# Patient Record
Sex: Female | Born: 1945 | Race: White | Hispanic: No | Marital: Married | Smoking: Current every day smoker
Health system: Southern US, Community
[De-identification: ages and names within clinical notes are randomized; demographics above are authoritative.]

## PROBLEM LIST (undated history)

## (undated) DIAGNOSIS — Z716 Tobacco abuse counseling: Secondary | ICD-10-CM

## (undated) DIAGNOSIS — D649 Anemia, unspecified: Secondary | ICD-10-CM

## (undated) DIAGNOSIS — J439 Emphysema, unspecified: Secondary | ICD-10-CM

## (undated) DIAGNOSIS — F329 Major depressive disorder, single episode, unspecified: Secondary | ICD-10-CM

## (undated) DIAGNOSIS — G459 Transient cerebral ischemic attack, unspecified: Secondary | ICD-10-CM

## (undated) DIAGNOSIS — J189 Pneumonia, unspecified organism: Secondary | ICD-10-CM

## (undated) DIAGNOSIS — M546 Pain in thoracic spine: Secondary | ICD-10-CM

## (undated) DIAGNOSIS — M1990 Unspecified osteoarthritis, unspecified site: Secondary | ICD-10-CM

## (undated) DIAGNOSIS — E039 Hypothyroidism, unspecified: Secondary | ICD-10-CM

## (undated) DIAGNOSIS — C4491 Basal cell carcinoma of skin, unspecified: Secondary | ICD-10-CM

## (undated) DIAGNOSIS — K219 Gastro-esophageal reflux disease without esophagitis: Secondary | ICD-10-CM

## (undated) DIAGNOSIS — L719 Rosacea, unspecified: Secondary | ICD-10-CM

## (undated) DIAGNOSIS — F32A Depression, unspecified: Secondary | ICD-10-CM

## (undated) DIAGNOSIS — R918 Other nonspecific abnormal finding of lung field: Secondary | ICD-10-CM

## (undated) DIAGNOSIS — R519 Headache, unspecified: Secondary | ICD-10-CM

## (undated) DIAGNOSIS — R51 Headache: Secondary | ICD-10-CM

## (undated) DIAGNOSIS — Z5111 Encounter for antineoplastic chemotherapy: Secondary | ICD-10-CM

## (undated) DIAGNOSIS — F419 Anxiety disorder, unspecified: Secondary | ICD-10-CM

## (undated) DIAGNOSIS — R000 Tachycardia, unspecified: Secondary | ICD-10-CM

## (undated) DIAGNOSIS — I639 Cerebral infarction, unspecified: Secondary | ICD-10-CM

## (undated) HISTORY — DX: Rosacea, unspecified: L71.9

## (undated) HISTORY — DX: Tobacco abuse counseling: Z71.6

## (undated) HISTORY — DX: Encounter for antineoplastic chemotherapy: Z51.11

## (undated) HISTORY — PX: BASAL CELL CARCINOMA EXCISION: SHX1214

## (undated) HISTORY — DX: Gastro-esophageal reflux disease without esophagitis: K21.9

## (undated) HISTORY — PX: TUBAL LIGATION: SHX77

## (undated) HISTORY — PX: BACK SURGERY: SHX140

## (undated) HISTORY — DX: Pain in thoracic spine: M54.6

## (undated) HISTORY — DX: Unspecified osteoarthritis, unspecified site: M19.90

## (undated) HISTORY — PX: BREAST BIOPSY: SHX20

## (undated) HISTORY — DX: Hypothyroidism, unspecified: E03.9

---
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
## 2001-08-24 HISTORY — PX: BUNIONECTOMY: SHX129

## 2004-08-29 ENCOUNTER — Other Ambulatory Visit: Admission: RE | Admit: 2004-08-29 | Discharge: 2004-08-29 | Payer: Self-pay | Admitting: Internal Medicine

## 2004-08-29 ENCOUNTER — Ambulatory Visit: Payer: Self-pay | Admitting: Internal Medicine

## 2004-11-18 ENCOUNTER — Emergency Department (HOSPITAL_COMMUNITY): Admission: EM | Admit: 2004-11-18 | Discharge: 2004-11-18 | Payer: Self-pay | Admitting: Family Medicine

## 2005-03-01 ENCOUNTER — Emergency Department (HOSPITAL_COMMUNITY): Admission: EM | Admit: 2005-03-01 | Discharge: 2005-03-01 | Payer: Self-pay | Admitting: Family Medicine

## 2005-03-18 ENCOUNTER — Emergency Department (HOSPITAL_COMMUNITY): Admission: EM | Admit: 2005-03-18 | Discharge: 2005-03-18 | Payer: Self-pay | Admitting: Emergency Medicine

## 2005-03-25 ENCOUNTER — Ambulatory Visit: Payer: Self-pay | Admitting: Internal Medicine

## 2005-04-13 ENCOUNTER — Ambulatory Visit: Payer: Self-pay | Admitting: Internal Medicine

## 2005-04-26 ENCOUNTER — Emergency Department (HOSPITAL_COMMUNITY): Admission: EM | Admit: 2005-04-26 | Discharge: 2005-04-26 | Payer: Self-pay | Admitting: Family Medicine

## 2005-05-06 ENCOUNTER — Ambulatory Visit: Payer: Self-pay | Admitting: Internal Medicine

## 2005-06-09 ENCOUNTER — Ambulatory Visit (HOSPITAL_COMMUNITY): Admission: RE | Admit: 2005-06-09 | Discharge: 2005-06-09 | Payer: Self-pay | Admitting: Internal Medicine

## 2005-07-06 ENCOUNTER — Emergency Department (HOSPITAL_COMMUNITY): Admission: EM | Admit: 2005-07-06 | Discharge: 2005-07-06 | Payer: Self-pay | Admitting: Family Medicine

## 2005-07-13 ENCOUNTER — Emergency Department (HOSPITAL_COMMUNITY): Admission: EM | Admit: 2005-07-13 | Discharge: 2005-07-13 | Payer: Self-pay | Admitting: Family Medicine

## 2005-09-02 ENCOUNTER — Other Ambulatory Visit: Admission: RE | Admit: 2005-09-02 | Discharge: 2005-09-02 | Payer: Self-pay | Admitting: Internal Medicine

## 2005-09-02 ENCOUNTER — Encounter: Payer: Self-pay | Admitting: Internal Medicine

## 2005-09-02 ENCOUNTER — Ambulatory Visit: Payer: Self-pay | Admitting: Internal Medicine

## 2005-09-27 ENCOUNTER — Emergency Department (HOSPITAL_COMMUNITY): Admission: EM | Admit: 2005-09-27 | Discharge: 2005-09-27 | Payer: Self-pay | Admitting: Family Medicine

## 2005-11-08 ENCOUNTER — Emergency Department (HOSPITAL_COMMUNITY): Admission: AD | Admit: 2005-11-08 | Discharge: 2005-11-08 | Payer: Self-pay | Admitting: Family Medicine

## 2006-05-10 ENCOUNTER — Emergency Department (HOSPITAL_COMMUNITY): Admission: EM | Admit: 2006-05-10 | Discharge: 2006-05-10 | Payer: Self-pay | Admitting: Emergency Medicine

## 2006-05-31 ENCOUNTER — Emergency Department (HOSPITAL_COMMUNITY): Admission: EM | Admit: 2006-05-31 | Discharge: 2006-05-31 | Payer: Self-pay | Admitting: Emergency Medicine

## 2006-06-22 ENCOUNTER — Ambulatory Visit (HOSPITAL_COMMUNITY): Admission: RE | Admit: 2006-06-22 | Discharge: 2006-06-22 | Payer: Self-pay | Admitting: Internal Medicine

## 2006-06-24 ENCOUNTER — Encounter: Payer: Self-pay | Admitting: Internal Medicine

## 2006-06-24 LAB — CONVERTED CEMR LAB

## 2006-07-05 ENCOUNTER — Encounter: Admission: RE | Admit: 2006-07-05 | Discharge: 2006-07-05 | Payer: Self-pay | Admitting: Internal Medicine

## 2006-07-12 ENCOUNTER — Ambulatory Visit: Payer: Self-pay | Admitting: Internal Medicine

## 2006-07-12 LAB — CONVERTED CEMR LAB
ALT: 10 units/L (ref 0–40)
AST: 15 units/L (ref 0–37)
Albumin: 3.3 g/dL — ABNORMAL LOW (ref 3.5–5.2)
Alkaline Phosphatase: 84 units/L (ref 39–117)
BUN: 8 mg/dL (ref 6–23)
Basophils Absolute: 0 10*3/uL (ref 0.0–0.1)
Basophils Relative: 0 % (ref 0.0–1.0)
CO2: 28 meq/L (ref 19–32)
Calcium: 8.7 mg/dL (ref 8.4–10.5)
Chloride: 104 meq/L (ref 96–112)
Chol/HDL Ratio, serum: 3.7
Cholesterol: 190 mg/dL (ref 0–200)
Creatinine, Ser: 0.8 mg/dL (ref 0.4–1.2)
Eosinophil percent: 3.6 % (ref 0.0–5.0)
GFR calc non Af Amer: 78 mL/min
Glomerular Filtration Rate, Af Am: 95 mL/min/{1.73_m2}
Glucose, Bld: 101 mg/dL — ABNORMAL HIGH (ref 70–99)
HCT: 37.5 % (ref 36.0–46.0)
HDL: 52 mg/dL (ref >39.0)
Hemoglobin: 12.8 g/dL (ref 12.0–15.0)
LDL Cholesterol: 123 mg/dL — ABNORMAL HIGH (ref 0–99)
Lymphocytes Relative: 23.4 % (ref 12.0–46.0)
MCHC: 34.1 g/dL (ref 30.0–36.0)
MCV: 96.4 fL (ref 78.0–100.0)
Monocytes Absolute: 0.6 10*3/uL (ref 0.2–0.7)
Monocytes Relative: 7.8 % (ref 3.0–11.0)
Neutro Abs: 4.8 10*3/uL (ref 1.4–7.7)
Neutrophils Relative %: 65.2 % (ref 43.0–77.0)
Platelets: 290 10*3/uL (ref 150–400)
Potassium: 3.7 meq/L (ref 3.5–5.1)
RBC: 3.89 M/uL (ref 3.87–5.11)
RDW: 12.5 % (ref 11.5–14.6)
Sodium: 138 meq/L (ref 135–145)
TSH: 1.19 microintl units/mL (ref 0.35–5.50)
Total Bilirubin: 0.5 mg/dL (ref 0.3–1.2)
Total Protein: 6.2 g/dL (ref 6.0–8.3)
Triglyceride fasting, serum: 75 mg/dL (ref 0–149)
VLDL: 15 mg/dL (ref 0–40)
WBC: 7.4 10*3/uL (ref 4.5–10.5)

## 2006-07-14 ENCOUNTER — Ambulatory Visit: Payer: Self-pay | Admitting: Internal Medicine

## 2006-07-19 ENCOUNTER — Encounter: Payer: Self-pay | Admitting: Internal Medicine

## 2006-07-19 ENCOUNTER — Ambulatory Visit: Payer: Self-pay | Admitting: Internal Medicine

## 2006-07-19 ENCOUNTER — Other Ambulatory Visit: Admission: RE | Admit: 2006-07-19 | Discharge: 2006-07-19 | Payer: Self-pay | Admitting: Internal Medicine

## 2006-08-29 LAB — HM PAP SMEAR

## 2006-08-30 LAB — HM MAMMOGRAPHY

## 2007-02-14 ENCOUNTER — Ambulatory Visit: Payer: Self-pay | Admitting: Internal Medicine

## 2007-04-21 ENCOUNTER — Encounter: Payer: Self-pay | Admitting: Internal Medicine

## 2007-04-21 DIAGNOSIS — E039 Hypothyroidism, unspecified: Secondary | ICD-10-CM | POA: Insufficient documentation

## 2007-04-21 DIAGNOSIS — M1990 Unspecified osteoarthritis, unspecified site: Secondary | ICD-10-CM

## 2007-04-21 DIAGNOSIS — L719 Rosacea, unspecified: Secondary | ICD-10-CM

## 2007-06-20 ENCOUNTER — Ambulatory Visit: Payer: Self-pay | Admitting: Internal Medicine

## 2007-06-20 LAB — CONVERTED CEMR LAB
ALT: 10 units/L (ref 0–35)
AST: 14 units/L (ref 0–37)
Albumin: 3.3 g/dL — ABNORMAL LOW (ref 3.5–5.2)
Alkaline Phosphatase: 75 units/L (ref 39–117)
BUN: 8 mg/dL (ref 6–23)
Basophils Absolute: 0.1 10*3/uL (ref 0.0–0.1)
Basophils Relative: 0.7 % (ref 0.0–1.0)
Bilirubin, Direct: 0.1 mg/dL (ref 0.0–0.3)
Blood in Urine, dipstick: NEGATIVE
CO2: 29 meq/L (ref 19–32)
Calcium: 9.1 mg/dL (ref 8.4–10.5)
Chloride: 109 meq/L (ref 96–112)
Cholesterol: 195 mg/dL (ref 0–200)
Creatinine, Ser: 0.8 mg/dL (ref 0.4–1.2)
Eosinophils Absolute: 1.1 10*3/uL — ABNORMAL HIGH (ref 0.0–0.6)
Eosinophils Relative: 10.9 % — ABNORMAL HIGH (ref 0.0–5.0)
GFR calc Af Amer: 94 mL/min
GFR calc non Af Amer: 78 mL/min
Glucose, Bld: 105 mg/dL — ABNORMAL HIGH (ref 70–99)
Glucose, Urine, Semiquant: NEGATIVE
HCT: 37.1 % (ref 36.0–46.0)
HDL: 47.3 mg/dL (ref >39.0)
Hemoglobin: 12.8 g/dL (ref 12.0–15.0)
Ketones, urine, test strip: NEGATIVE
LDL Cholesterol: 129 mg/dL — ABNORMAL HIGH (ref 0–99)
Lymphocytes Relative: 22.3 % (ref 12.0–46.0)
MCHC: 34.5 g/dL (ref 30.0–36.0)
MCV: 95.3 fL (ref 78.0–100.0)
Monocytes Absolute: 0.7 10*3/uL (ref 0.2–0.7)
Monocytes Relative: 7 % (ref 3.0–11.0)
Neutro Abs: 5.7 10*3/uL (ref 1.4–7.7)
Neutrophils Relative %: 59.1 % (ref 43.0–77.0)
Nitrite: NEGATIVE
Platelets: 338 10*3/uL (ref 150–400)
Potassium: 4.3 meq/L (ref 3.5–5.1)
Protein, U semiquant: NEGATIVE
RBC: 3.89 M/uL (ref 3.87–5.11)
RDW: 12.4 % (ref 11.5–14.6)
Sodium: 143 meq/L (ref 135–145)
Specific Gravity, Urine: 1.015
TSH: 2.31 microintl units/mL (ref 0.35–5.50)
Total Bilirubin: 0.4 mg/dL (ref 0.3–1.2)
Total CHOL/HDL Ratio: 4.1
Total Protein: 5.7 g/dL — ABNORMAL LOW (ref 6.0–8.3)
Triglycerides: 94 mg/dL (ref 0–149)
Urobilinogen, UA: 0.2
VLDL: 19 mg/dL (ref 0–40)
WBC Urine, dipstick: NEGATIVE
WBC: 9.8 10*3/uL (ref 4.5–10.5)
pH: 7

## 2007-06-27 ENCOUNTER — Ambulatory Visit: Payer: Self-pay | Admitting: Internal Medicine

## 2007-06-27 ENCOUNTER — Encounter: Payer: Self-pay | Admitting: Internal Medicine

## 2007-06-27 ENCOUNTER — Other Ambulatory Visit: Admission: RE | Admit: 2007-06-27 | Discharge: 2007-06-27 | Payer: Self-pay | Admitting: Internal Medicine

## 2007-06-27 DIAGNOSIS — M810 Age-related osteoporosis without current pathological fracture: Secondary | ICD-10-CM | POA: Insufficient documentation

## 2007-06-27 DIAGNOSIS — R609 Edema, unspecified: Secondary | ICD-10-CM

## 2007-06-27 DIAGNOSIS — G479 Sleep disorder, unspecified: Secondary | ICD-10-CM

## 2007-06-27 DIAGNOSIS — R7309 Other abnormal glucose: Secondary | ICD-10-CM

## 2007-06-27 DIAGNOSIS — R209 Unspecified disturbances of skin sensation: Secondary | ICD-10-CM

## 2007-06-27 DIAGNOSIS — M79609 Pain in unspecified limb: Secondary | ICD-10-CM

## 2007-09-05 ENCOUNTER — Ambulatory Visit: Payer: Self-pay | Admitting: Internal Medicine

## 2007-09-12 ENCOUNTER — Ambulatory Visit: Payer: Self-pay | Admitting: Internal Medicine

## 2007-09-12 ENCOUNTER — Encounter: Admission: RE | Admit: 2007-09-12 | Discharge: 2007-09-12 | Payer: Self-pay | Admitting: Internal Medicine

## 2007-09-12 DIAGNOSIS — R12 Heartburn: Secondary | ICD-10-CM

## 2007-09-26 ENCOUNTER — Telehealth: Payer: Self-pay | Admitting: Internal Medicine

## 2008-01-11 ENCOUNTER — Telehealth: Payer: Self-pay | Admitting: *Deleted

## 2008-02-23 ENCOUNTER — Telehealth: Payer: Self-pay | Admitting: Internal Medicine

## 2008-02-28 ENCOUNTER — Telehealth: Payer: Self-pay | Admitting: Family Medicine

## 2008-06-18 ENCOUNTER — Ambulatory Visit: Payer: Self-pay | Admitting: Internal Medicine

## 2008-06-18 LAB — CONVERTED CEMR LAB
ALT: 11 units/L (ref 0–35)
AST: 16 units/L (ref 0–37)
Albumin: 3.4 g/dL — ABNORMAL LOW (ref 3.5–5.2)
Alkaline Phosphatase: 77 units/L (ref 39–117)
BUN: 6 mg/dL (ref 6–23)
Basophils Absolute: 0.1 10*3/uL (ref 0.0–0.1)
Basophils Relative: 0.7 % (ref 0.0–3.0)
Bilirubin, Direct: 0.1 mg/dL (ref 0.0–0.3)
Blood in Urine, dipstick: NEGATIVE
CO2: 28 meq/L (ref 19–32)
Calcium: 8.6 mg/dL (ref 8.4–10.5)
Chloride: 108 meq/L (ref 96–112)
Cholesterol: 202 mg/dL (ref 0–200)
Creatinine, Ser: 0.7 mg/dL (ref 0.4–1.2)
Direct LDL: 123.9 mg/dL
Eosinophils Absolute: 0.4 10*3/uL (ref 0.0–0.7)
Eosinophils Relative: 5.8 % — ABNORMAL HIGH (ref 0.0–5.0)
GFR calc Af Amer: 110 mL/min
GFR calc non Af Amer: 91 mL/min
Glucose, Bld: 86 mg/dL (ref 70–99)
Glucose, Urine, Semiquant: NEGATIVE
HCT: 35.5 % — ABNORMAL LOW (ref 36.0–46.0)
HDL: 46.3 mg/dL (ref >39.0)
Hemoglobin: 12.4 g/dL (ref 12.0–15.0)
Hgb A1c MFr Bld: 5.5 % (ref 4.6–6.0)
Ketones, urine, test strip: NEGATIVE
Lymphocytes Relative: 21.1 % (ref 12.0–46.0)
MCHC: 35 g/dL (ref 30.0–36.0)
MCV: 95.3 fL (ref 78.0–100.0)
Monocytes Absolute: 0.6 10*3/uL (ref 0.1–1.0)
Monocytes Relative: 7.8 % (ref 3.0–12.0)
Neutro Abs: 5 10*3/uL (ref 1.4–7.7)
Neutrophils Relative %: 64.6 % (ref 43.0–77.0)
Nitrite: NEGATIVE
Platelets: 278 10*3/uL (ref 150–400)
Potassium: 3.6 meq/L (ref 3.5–5.1)
Protein, U semiquant: NEGATIVE
RBC: 3.73 M/uL — ABNORMAL LOW (ref 3.87–5.11)
RDW: 12.8 % (ref 11.5–14.6)
Sodium: 142 meq/L (ref 135–145)
Specific Gravity, Urine: 1.015
TSH: 0.3 microintl units/mL — ABNORMAL LOW (ref 0.35–5.50)
Total Bilirubin: 0.6 mg/dL (ref 0.3–1.2)
Total CHOL/HDL Ratio: 4.4
Total Protein: 6.5 g/dL (ref 6.0–8.3)
Triglycerides: 91 mg/dL (ref 0–149)
Urobilinogen, UA: 0.2
VLDL: 18 mg/dL (ref 0–40)
WBC: 7.7 10*3/uL (ref 4.5–10.5)
pH: 7

## 2008-06-25 ENCOUNTER — Encounter: Payer: Self-pay | Admitting: Internal Medicine

## 2008-06-25 ENCOUNTER — Other Ambulatory Visit: Admission: RE | Admit: 2008-06-25 | Discharge: 2008-06-25 | Payer: Self-pay | Admitting: Internal Medicine

## 2008-06-25 ENCOUNTER — Ambulatory Visit: Payer: Self-pay | Admitting: Internal Medicine

## 2008-06-25 DIAGNOSIS — L570 Actinic keratosis: Secondary | ICD-10-CM

## 2008-06-25 DIAGNOSIS — J310 Chronic rhinitis: Secondary | ICD-10-CM

## 2008-06-26 ENCOUNTER — Telehealth: Payer: Self-pay | Admitting: Internal Medicine

## 2008-10-01 ENCOUNTER — Ambulatory Visit: Payer: Self-pay | Admitting: Internal Medicine

## 2008-10-01 LAB — CONVERTED CEMR LAB: Hemoglobin: 12.4 g/dL

## 2008-10-04 LAB — CONVERTED CEMR LAB: TSH: 1.91 microintl units/mL (ref 0.35–5.50)

## 2008-11-27 ENCOUNTER — Emergency Department (HOSPITAL_COMMUNITY): Admission: EM | Admit: 2008-11-27 | Discharge: 2008-11-27 | Payer: Self-pay | Admitting: Family Medicine

## 2009-03-04 ENCOUNTER — Telehealth: Payer: Self-pay | Admitting: *Deleted

## 2009-04-11 ENCOUNTER — Ambulatory Visit: Payer: Self-pay | Admitting: Internal Medicine

## 2009-04-11 DIAGNOSIS — R030 Elevated blood-pressure reading, without diagnosis of hypertension: Secondary | ICD-10-CM

## 2009-04-16 ENCOUNTER — Telehealth: Payer: Self-pay | Admitting: *Deleted

## 2009-06-18 ENCOUNTER — Ambulatory Visit: Payer: Self-pay | Admitting: Internal Medicine

## 2009-06-18 LAB — CONVERTED CEMR LAB
ALT: 12 units/L (ref 0–35)
AST: 15 units/L (ref 0–37)
Albumin: 3.2 g/dL — ABNORMAL LOW (ref 3.5–5.2)
Alkaline Phosphatase: 84 units/L (ref 39–117)
BUN: 7 mg/dL (ref 6–23)
Basophils Absolute: 0 10*3/uL (ref 0.0–0.1)
Basophils Relative: 0.4 % (ref 0.0–3.0)
Bilirubin, Direct: 0.1 mg/dL (ref 0.0–0.3)
CO2: 27 meq/L (ref 19–32)
Calcium: 9.3 mg/dL (ref 8.4–10.5)
Chloride: 104 meq/L (ref 96–112)
Cholesterol: 156 mg/dL (ref 0–200)
Creatinine, Ser: 0.9 mg/dL (ref 0.4–1.2)
Eosinophils Absolute: 0.4 10*3/uL (ref 0.0–0.7)
Eosinophils Relative: 4.7 % (ref 0.0–5.0)
GFR calc non Af Amer: 67.46 mL/min (ref >60)
Glucose, Bld: 92 mg/dL (ref 70–99)
HCT: 36.9 % (ref 36.0–46.0)
HDL: 49.6 mg/dL (ref >39.00)
Hemoglobin, Urine: NEGATIVE
Hemoglobin: 12.9 g/dL (ref 12.0–15.0)
Ketones, ur: NEGATIVE mg/dL
LDL Cholesterol: 92 mg/dL (ref 0–99)
Lymphocytes Relative: 21.4 % (ref 12.0–46.0)
Lymphs Abs: 1.7 10*3/uL (ref 0.7–4.0)
MCHC: 35.1 g/dL (ref 30.0–36.0)
MCV: 97.7 fL (ref 78.0–100.0)
Monocytes Absolute: 0.7 10*3/uL (ref 0.1–1.0)
Monocytes Relative: 8.6 % (ref 3.0–12.0)
Neutro Abs: 5 10*3/uL (ref 1.4–7.7)
Neutrophils Relative %: 64.9 % (ref 43.0–77.0)
Nitrite: NEGATIVE
Platelets: 304 10*3/uL (ref 150.0–400.0)
Potassium: 3.9 meq/L (ref 3.5–5.1)
RBC: 3.77 M/uL — ABNORMAL LOW (ref 3.87–5.11)
RDW: 12.2 % (ref 11.5–14.6)
Sodium: 138 meq/L (ref 135–145)
Specific Gravity, Urine: 1.01 (ref 1.000–1.030)
TSH: 2.45 microintl units/mL (ref 0.35–5.50)
Total Bilirubin: 0.6 mg/dL (ref 0.3–1.2)
Total CHOL/HDL Ratio: 3
Total Protein, Urine: NEGATIVE mg/dL
Total Protein: 6.7 g/dL (ref 6.0–8.3)
Triglycerides: 70 mg/dL (ref 0.0–149.0)
Urine Glucose: NEGATIVE mg/dL
Urobilinogen, UA: 0.2 (ref 0.0–1.0)
VLDL: 14 mg/dL (ref 0.0–40.0)
WBC: 7.8 10*3/uL (ref 4.5–10.5)
pH: 7 (ref 5.0–8.0)

## 2009-06-24 ENCOUNTER — Ambulatory Visit: Payer: Self-pay | Admitting: Internal Medicine

## 2009-06-24 DIAGNOSIS — Z87891 Personal history of nicotine dependence: Secondary | ICD-10-CM

## 2009-06-27 ENCOUNTER — Telehealth: Payer: Self-pay | Admitting: Internal Medicine

## 2010-01-28 ENCOUNTER — Ambulatory Visit: Payer: Self-pay | Admitting: Internal Medicine

## 2010-01-31 ENCOUNTER — Encounter: Payer: Self-pay | Admitting: *Deleted

## 2010-01-31 LAB — CONVERTED CEMR LAB: TSH: 0.61 microintl units/mL (ref 0.35–5.50)

## 2010-06-23 ENCOUNTER — Ambulatory Visit: Payer: Self-pay | Admitting: Internal Medicine

## 2010-06-23 LAB — CONVERTED CEMR LAB
ALT: 7 units/L (ref 0–35)
AST: 12 units/L (ref 0–37)
Albumin: 3.4 g/dL — ABNORMAL LOW (ref 3.5–5.2)
Alkaline Phosphatase: 85 units/L (ref 39–117)
BUN: 8 mg/dL (ref 6–23)
Basophils Absolute: 0 10*3/uL (ref 0.0–0.1)
Basophils Relative: 0.6 % (ref 0.0–3.0)
Bilirubin, Direct: 0 mg/dL (ref 0.0–0.3)
Blood in Urine, dipstick: NEGATIVE
CO2: 26 meq/L (ref 19–32)
Calcium: 8.6 mg/dL (ref 8.4–10.5)
Chloride: 101 meq/L (ref 96–112)
Cholesterol: 210 mg/dL — ABNORMAL HIGH (ref 0–200)
Creatinine, Ser: 0.8 mg/dL (ref 0.4–1.2)
Direct LDL: 132.5 mg/dL
Eosinophils Absolute: 0.3 10*3/uL (ref 0.0–0.7)
Eosinophils Relative: 4.8 % (ref 0.0–5.0)
GFR calc non Af Amer: 80.5 mL/min (ref >60)
Glucose, Bld: 79 mg/dL (ref 70–99)
Glucose, Urine, Semiquant: NEGATIVE
HCT: 36.6 % (ref 36.0–46.0)
HDL: 53.6 mg/dL (ref >39.00)
Hemoglobin: 12.5 g/dL (ref 12.0–15.0)
Ketones, urine, test strip: NEGATIVE
Lymphocytes Relative: 25.8 % (ref 12.0–46.0)
Lymphs Abs: 1.7 10*3/uL (ref 0.7–4.0)
MCHC: 34.2 g/dL (ref 30.0–36.0)
MCV: 97.7 fL (ref 78.0–100.0)
Monocytes Absolute: 0.4 10*3/uL (ref 0.1–1.0)
Monocytes Relative: 6.8 % (ref 3.0–12.0)
Neutro Abs: 4 10*3/uL (ref 1.4–7.7)
Neutrophils Relative %: 62 % (ref 43.0–77.0)
Nitrite: POSITIVE
Platelets: 296 10*3/uL (ref 150.0–400.0)
Potassium: 4.1 meq/L (ref 3.5–5.1)
RBC: 3.74 M/uL — ABNORMAL LOW (ref 3.87–5.11)
RDW: 13.3 % (ref 11.5–14.6)
Sodium: 140 meq/L (ref 135–145)
Specific Gravity, Urine: 1.02
TSH: 0.65 microintl units/mL (ref 0.35–5.50)
Total Bilirubin: 0.4 mg/dL (ref 0.3–1.2)
Total CHOL/HDL Ratio: 4
Total Protein: 6.4 g/dL (ref 6.0–8.3)
Triglycerides: 94 mg/dL (ref 0.0–149.0)
Urobilinogen, UA: 0.2
VLDL: 18.8 mg/dL (ref 0.0–40.0)
WBC: 6.5 10*3/uL (ref 4.5–10.5)
pH: 8.5

## 2010-06-30 ENCOUNTER — Ambulatory Visit: Payer: Self-pay | Admitting: Internal Medicine

## 2010-06-30 ENCOUNTER — Other Ambulatory Visit: Admission: RE | Admit: 2010-06-30 | Discharge: 2010-06-30 | Payer: Self-pay | Admitting: Internal Medicine

## 2010-06-30 DIAGNOSIS — N390 Urinary tract infection, site not specified: Secondary | ICD-10-CM

## 2010-06-30 LAB — CONVERTED CEMR LAB
Blood in Urine, dipstick: NEGATIVE
Glucose, Urine, Semiquant: NEGATIVE
Ketones, urine, test strip: NEGATIVE
Nitrite: NEGATIVE
Protein, U semiquant: NEGATIVE
Specific Gravity, Urine: 1.01
Urobilinogen, UA: 0.2
pH: 7

## 2010-07-03 LAB — CONVERTED CEMR LAB: Pap Smear: NEGATIVE

## 2010-07-04 ENCOUNTER — Encounter: Payer: Self-pay | Admitting: Internal Medicine

## 2010-08-12 ENCOUNTER — Telehealth: Payer: Self-pay | Admitting: *Deleted

## 2010-09-21 LAB — CONVERTED CEMR LAB
Calcium, Total (PTH): 9.1 mg/dL (ref 8.4–10.5)
Hgb A1c MFr Bld: 5.6 % (ref 4.6–6.0)
PTH: 25.5 pg/mL (ref 14.0–72.0)
TSH: 0.63 microintl units/mL (ref 0.35–5.50)
Vit D, 1,25-Dihydroxy: 44 (ref 30–89)

## 2010-09-23 NOTE — Letter (Signed)
Summary: Generic Letter  [place] at [facility]  [address] [place], [place] [address]   Phone: [phone]  Fax: [phone]    01/31/2010  [name] [address] [place], [place]  [address]  Dear Ms. Lukes,  Your tsh is good . Continue on same dose and we will see you  at a yearly check up in the fall . If you have any questions, please give [place] a call at [phone].          Sincerely,   Tor Netters, CMA (AAMA)

## 2010-09-23 NOTE — Assessment & Plan Note (Signed)
Summary: cpx/pap/cjr   Vital Signs:  Patient profile:   65 year old female Menstrual status:  postmenopausal Height:      62 inches Weight:      118 pounds BMI:     21.66 Pulse rate:   66 / minute BP sitting:   120 / 80  (left arm) Cuff size:   regular  Vitals Entered By: Romualdo Bolk, CMA (AAMA) (June 30, 2010 9:18 AM) CC: CPX with pap. Pt states that her bp has been going up when she takes it. Pt is having lower back pain, burning upon urination x 1 month.   History of Present Illness: [name] comes in today   for her yearly preventive visit and med check .Marland KitchenSince last visit  here  there have been no major changes in health status  . taking  her Multivit  ,synthroid and  nsaid   and tca and doing well.  NO injury change in health status .   legs tend to swell at end of day. no injury.  Uti symptoms for almost a month comes and goes .  no fever .  no rx .   Hx of utis  in past.  No change in exercise tolerance . No cp sob .  syndope . bleeding     Preventive Care Screening  Mammogram:    Date:  08/25/2007    Results:  normal    Preventive Screening-Counseling & Management  Alcohol-Tobacco     Alcohol drinks/day: 0     Smoking Status: quit     Year Quit: 07/2008  Caffeine-Diet-Exercise     Caffeine use/day: 3     Does Patient Exercise: walking    Comments: 2 miles per day.   Hep-HIV-STD-Contraception     Dental Visit-last 6 months yes     Sun Exposure-Excessive: no  Safety-Violence-Falls     Seat Belt Use: yes     Smoke Detectors: yes  Current Medications (verified): 1)  Amitriptyline Hcl 100 Mg  Tabs (Amitriptyline Hcl) .... 3 By Mouth At Bedtime 2)  Etodolac 500 Mg Tabs (Etodolac) .Marland Kitchen.. 1 By Mouth Two Times A Day 3)  Synthroid 75 Mcg Tabs (Levothyroxine Sodium) .Marland Kitchen.. 1 By Mouth Once Daily  Allergies (verified): No Known Drug Allergies  Past History:  Past medical, surgical, family and social histories (including risk factors) reviewed, and  no changes noted (except as noted below).  Past Medical History: Reviewed history from 06/25/2008 and no changes required. Hypothyroidism Osteoarthritis rosecea FBS 101        LAST Mammogram: 2008 Pap: 2008 Td: 05/06/05  Colonscopy:8 years ago EKG: 2006  Past Surgical History: Reviewed history from 04/21/2007 and no changes required. G3 P3 Tonsillectomy  Past History:  Care Management: None Current Dermatology :  Family History: Reviewed history from 09/12/2007 and no changes required. [place] daughter with anorexia Family History of Arthritis DM  Social History: Reviewed history from 06/24/2009 and no changes required. 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 [address] [place] [employer] ocassional alcohol    none now  no current exercise  3 caffiene  [employer] 4  son getting divorce  Married Does Patient Exercise:  walking    Review of Systems  The patient denies anorexia, fever, weight loss, weight gain, vision loss, decreased hearing, hoarseness, chest pain, syncope, dyspnea on exertion, prolonged cough, abdominal pain, melena, hematochezia, severe indigestion/heartburn, incontinence, muscle weakness, transient blindness, difficulty walking,  depression, unusual weight change, enlarged lymph nodes, angioedema, and breast masses.    Physical Exam  General:  Well-developed,well-nourished,in no acute distress; alert,appropriate and cooperative throughout examination Head:  normocephalic and atraumatic.   Eyes:  vision grossly intact, pupils equal, and pupils round.   Ears:  R ear normal, L ear normal, and no external deformities.   Nose:  no nasal discharge.   Mouth:  pharynx pink and moist.   Neck:  No deformities, masses, or tenderness noted. Breasts:  No mass, nodules, thickening, tenderness, bulging, retraction, inflamation, nipple discharge or skin changes noted.   Lungs:  Normal respiratory effort, chest  expands symmetrically. Lungs are clear to auscultation, no crackles or wheezes.no dullness.   Heart:  Normal rate and regular rhythm. S1 and S2 normal without gallop, murmur, click, rub or other extra sounds.no lifts.   rate  84  rr  Abdomen:  Bowel sounds positive,abdomen soft and non-tender without masses, organomegaly or hernias noted. Rectal:  No external abnormalities noted. Normal sphincter tone. No rectal masses or tenderness. Genitalia:  Pelvic Exam:        External: normal female genitalia without lesions or masses        Vagina: normal without lesions or masses        Cervix: normal without lesions or masses        Adnexa: normal bimanual exam without masses or fullness        Uterus: normal by palpation        Pap smear: performed Msk:  no joint swelling and no joint warmth.   Pulses:  pulses intact without delay   Extremities:  no clubbing cyanosis  trace edema  few venoud prominence  Neurologic:  alert & oriented X3, strength normal in all extremities, gait normal, and DTRs symmetrical and normal.    Skin:  turgor normal, color normal, no petechiae, and no purpura.  dry skin     sun chnages  Cervical Nodes:  No lymphadenopathy noted Axillary Nodes:  No palpable lymphadenopathy Inguinal Nodes:  No significant adenopathy Psych:  Normal eye contact, appropriate affect. Cognition appears normal.  ekg  sinus rhythm     Impression & Recommendations:  Problem # 1:  PREVENTIVE HEALTH CARE (ICD-V70.0)  Discussed nutrition,exercise,diet,healthy weight, vitamin D and calcium. continue healthy .si and tobacco free.  no health insurance  currently    Orders: EKG w/ Interpretation (93000)  Problem # 2:  HYPOTHYROIDISM (ICD-244.9)  no change in meds Her updated medication list for this problem includes:    Synthroid 75 Mcg Tabs (Levothyroxine sodium) .Marland Kitchen... 1 by mouth once daily  Labs Reviewed: TSH: 0.65 (06/23/2010)    HgBA1c: 5.5 (06/18/2008) Chol: 210 (06/23/2010)   HDL:  53.60 (06/23/2010)   LDL: 92 (06/18/2009)   TG: 94.0 (06/23/2010)  Orders: EKG w/ Interpretation (93000)  Problem # 3:  FASTING HYPERGLYCEMIA (ICD-790.29) Assessment: Improved resolved   Problem # 4:  EDEMA (ICD-782.3) seems like dependent from venous insuff   .     no other ob cause  .  disc managment,  Problem # 5:  UTI (ICD-599.0) empiric rx .     for now    Her updated medication list for this problem includes:    Macrobid 100 Mg Caps (Nitrofurantoin monohyd macro) .Marland Kitchen... 1 by mouth two times a day for urine infection generic  Complete Medication List: 1)  Amitriptyline Hcl 100 Mg Tabs (Amitriptyline hcl) .... 3 by mouth at bedtime 2)  Etodolac 500 Mg Tabs (Etodolac) .Marland KitchenMarland KitchenMarland Kitchen  1 by mouth two times a day 3)  Synthroid 75 Mcg Tabs (Levothyroxine sodium) .Marland Kitchen.. 1 by mouth once daily 4)  Macrobid 100 Mg Caps (Nitrofurantoin monohyd macro) .Marland Kitchen.. 1 by mouth two times a day for urine infection generic  Other Orders: Admin 1st Vaccine (38756) Flu Vaccine 47yrs + (43329)  Patient Instructions: 1)  no change in medications . 2)  continue calcium vit d in diet  3)   No change in synthroid   dosing. 4)  Take antibioitc for  urine infection 5)  If not better at end of medication then call  and we can do culture test.  6)  Consider using  over the counter compresion stockings when she is up  all day. Marland Kitchen  7)  get a mammogram.  Prescriptions: MACROBID 100 MG CAPS (NITROFURANTOIN MONOHYD MACRO) 1 by mouth two times a day for urine infection Generic  #14 x 0   Entered and Authorized by:   Madelin Headings MD   Signed by:   Madelin Headings MD on 06/30/2010   Method used:   [facility]        [facility]  [address] [phone]* (retail)       [address]       [place], [place]  [address]       Ph: [phone] or [phone]       Fax: [phone]   RxID:   [id number]    Orders Added: 1)  Admin 1st Vaccine [90471] 2)  Flu Vaccine 61yrs + [62694] 3)  Est. Patient 40-64 years [99396] 4)   EKG w/ Interpretation [93000]    Flu Vaccine Consent Questions     Do you have a history of severe allergic reactions to this vaccine? no    Any prior history of allergic reactions to egg and/or gelatin? no    Do you have a sensitivity to the preservative Thimersol? no    Do you have a past history of Guillan-Barre Syndrome? no    Do you currently have an acute febrile illness? no    Have you ever had a severe reaction to latex? no    Vaccine information given and explained to patient? yes    Are you currently pregnant? no    Lot Number:[id number]   Exp Date:02/21/2011   Site Given  Left Deltoid IM .lbflu Romualdo Bolk, CMA (AAMA)  June 30, 2010 9:21 AM  Laboratory Results   Urine Tests    Routine Urinalysis   Color: yellow Appearance: Clear Glucose: negative   (Normal Range: Negative) Bilirubin: large   (Normal Range: Negative) Ketone: negative   (Normal Range: Negative) Spec. Gravity: 1.010   (Normal Range: 1.003-1.035) Blood: negative   (Normal Range: Negative) pH: 7.0   (Normal Range: 5.0-8.0) Protein: negative   (Normal Range: Negative) Urobilinogen: 0.2   (Normal Range: 0-1) Nitrite: negative   (Normal Range: Negative) Leukocyte Esterace: small   (Normal Range: Negative)

## 2010-09-25 NOTE — Progress Notes (Signed)
Summary[facility] Todolac  Phone Note From Pharmacy Call back at [facility] [phone]  Audree Bane: [facility]  [address] [phone] Summary of Call: Pharmacist called wanting to know if it is ok to fill pts Amitriptylyne and Todolac. Pls call in to [phone] Initial call taken by: Lucy Antigua,  August 12, 2010 1:41 PM  Follow-up for Phone Call        see rx request Follow-up by: Romualdo Bolk, CMA Duncan Dull),  August 12, 2010 1:49 PM

## 2010-12-03 LAB — POCT URINALYSIS DIP (DEVICE)
Glucose, UA: NEGATIVE mg/dL
Hgb urine dipstick: NEGATIVE
Ketones, ur: NEGATIVE mg/dL
Nitrite: NEGATIVE
Protein, ur: 30 mg/dL — AB
Specific Gravity, Urine: 1.015 (ref 1.005–1.030)
Urobilinogen, UA: 0.2 mg/dL (ref 0.0–1.0)
pH: 7 (ref 5.0–8.0)

## 2010-12-03 LAB — URINE CULTURE: Colony Count: >=100000

## 2010-12-05 ENCOUNTER — Other Ambulatory Visit: Payer: Self-pay | Admitting: Internal Medicine

## 2011-02-01 ENCOUNTER — Other Ambulatory Visit: Payer: Self-pay | Admitting: Internal Medicine

## 2011-03-04 ENCOUNTER — Other Ambulatory Visit: Payer: Self-pay | Admitting: Internal Medicine

## 2011-03-05 ENCOUNTER — Other Ambulatory Visit: Payer: Self-pay | Admitting: *Deleted

## 2011-03-05 NOTE — Telephone Encounter (Signed)
Already done

## 2011-03-05 NOTE — Telephone Encounter (Signed)
Refill on Elavil 100mg  mg  LOV 06/30/10 NOV 07/14/11

## 2011-03-05 NOTE — Telephone Encounter (Signed)
LOV 06/30/10 NOV 07/14/11

## 2011-03-05 NOTE — Telephone Encounter (Signed)
?   A;ready did this   Ok to refill through appt

## 2011-03-30 ENCOUNTER — Other Ambulatory Visit: Payer: Self-pay | Admitting: Internal Medicine

## 2011-04-04 ENCOUNTER — Other Ambulatory Visit: Payer: Self-pay | Admitting: Internal Medicine

## 2011-05-03 ENCOUNTER — Other Ambulatory Visit: Payer: Self-pay | Admitting: Internal Medicine

## 2011-05-04 NOTE — Telephone Encounter (Signed)
Ok to refill x 3 months??       

## 2011-05-04 NOTE — Telephone Encounter (Signed)
Last OV-06/30/10 for a cpx NOV- none Please advise

## 2011-05-04 NOTE — Telephone Encounter (Signed)
NOV 07/14/11 for cpx

## 2011-05-05 NOTE — Telephone Encounter (Signed)
rx called in

## 2011-05-29 ENCOUNTER — Other Ambulatory Visit: Payer: Self-pay | Admitting: Internal Medicine

## 2011-07-07 ENCOUNTER — Other Ambulatory Visit (INDEPENDENT_AMBULATORY_CARE_PROVIDER_SITE_OTHER): Payer: Self-pay

## 2011-07-07 DIAGNOSIS — Z0000 Encounter for general adult medical examination without abnormal findings: Secondary | ICD-10-CM

## 2011-07-07 LAB — CBC WITH DIFFERENTIAL/PLATELET
Basophils Absolute: 0 10*3/uL (ref 0.0–0.1)
Basophils Relative: 0.3 % (ref 0.0–3.0)
Eosinophils Absolute: 0.2 10*3/uL (ref 0.0–0.7)
Eosinophils Relative: 3 % (ref 0.0–5.0)
HCT: 38 % (ref 36.0–46.0)
Hemoglobin: 12.8 g/dL (ref 12.0–15.0)
Lymphocytes Relative: 20.6 % (ref 12.0–46.0)
Lymphs Abs: 1.6 10*3/uL (ref 0.7–4.0)
MCHC: 33.6 g/dL (ref 30.0–36.0)
MCV: 98.7 fl (ref 78.0–100.0)
Monocytes Absolute: 0.6 10*3/uL (ref 0.1–1.0)
Monocytes Relative: 7.4 % (ref 3.0–12.0)
Neutro Abs: 5.5 10*3/uL (ref 1.4–7.7)
Neutrophils Relative %: 68.7 % (ref 43.0–77.0)
Platelets: 299 10*3/uL (ref 150.0–400.0)
RBC: 3.85 Mil/uL — ABNORMAL LOW (ref 3.87–5.11)
RDW: 13.3 % (ref 11.5–14.6)
WBC: 8 10*3/uL (ref 4.5–10.5)

## 2011-07-07 LAB — POCT URINALYSIS DIPSTICK
Glucose, UA: NEGATIVE
Nitrite, UA: POSITIVE

## 2011-07-07 LAB — BASIC METABOLIC PANEL
BUN: 8 mg/dL (ref 6–23)
CO2: 26 mEq/L (ref 19–32)
Calcium: 8.7 mg/dL (ref 8.4–10.5)
Chloride: 104 mEq/L (ref 96–112)
Creatinine, Ser: 1.1 mg/dL (ref 0.4–1.2)
GFR: 54.3 mL/min — ABNORMAL LOW (ref >60.00)
Glucose, Bld: 69 mg/dL — ABNORMAL LOW (ref 70–99)
Potassium: 4.3 mEq/L (ref 3.5–5.1)
Sodium: 139 mEq/L (ref 135–145)

## 2011-07-07 LAB — HEPATIC FUNCTION PANEL
ALT: 8 U/L (ref 0–35)
AST: 14 U/L (ref 0–37)
Alkaline Phosphatase: 82 U/L (ref 39–117)
Bilirubin, Direct: 0.1 mg/dL (ref 0.0–0.3)
Total Protein: 6.8 g/dL (ref 6.0–8.3)

## 2011-07-07 LAB — LIPID PANEL: Total CHOL/HDL Ratio: 3

## 2011-07-13 ENCOUNTER — Encounter: Payer: Self-pay | Admitting: Internal Medicine

## 2011-07-14 ENCOUNTER — Encounter: Payer: Self-pay | Admitting: Internal Medicine

## 2011-07-14 ENCOUNTER — Ambulatory Visit (INDEPENDENT_AMBULATORY_CARE_PROVIDER_SITE_OTHER): Payer: Self-pay | Admitting: Internal Medicine

## 2011-07-14 VITALS — BP 100/60 | HR 60 | Ht 62.0 in | Wt 121.0 lb

## 2011-07-14 DIAGNOSIS — M1990 Unspecified osteoarthritis, unspecified site: Secondary | ICD-10-CM

## 2011-07-14 DIAGNOSIS — H6120 Impacted cerumen, unspecified ear: Secondary | ICD-10-CM

## 2011-07-14 DIAGNOSIS — L719 Rosacea, unspecified: Secondary | ICD-10-CM

## 2011-07-14 DIAGNOSIS — Z23 Encounter for immunization: Secondary | ICD-10-CM

## 2011-07-14 DIAGNOSIS — G479 Sleep disorder, unspecified: Secondary | ICD-10-CM

## 2011-07-14 DIAGNOSIS — H9190 Unspecified hearing loss, unspecified ear: Secondary | ICD-10-CM

## 2011-07-14 DIAGNOSIS — N390 Urinary tract infection, site not specified: Secondary | ICD-10-CM

## 2011-07-14 DIAGNOSIS — Z0000 Encounter for general adult medical examination without abnormal findings: Secondary | ICD-10-CM

## 2011-07-14 DIAGNOSIS — E039 Hypothyroidism, unspecified: Secondary | ICD-10-CM

## 2011-07-14 MED ORDER — CIPROFLOXACIN HCL 500 MG PO TABS: 500.0000 mg | ORAL_TABLET | Freq: Two times a day (BID) | ORAL | Status: AC

## 2011-07-14 NOTE — Patient Instructions (Signed)
Antibiotic fr uti call if not better and will contact ou about culture results. Continue lifestyle intervention healthy eating and exercise . Avoid excess sodium. On change in meds .  Check in a year with labs

## 2011-07-14 NOTE — Progress Notes (Signed)
Subjective:    Patient ID: [name], female    DOB: 27-Nov-1945, 65 y.o.   MRN: [id number]  HPI Patient comes in today for preventive visit and follow-up of medical issues. Update of her history since her last visit. Decrease hearing left ear   Had some sinus  Infection in the past.  Swollen legs   Some worse after standing but all the time no injury or redness or  Ulcers   Taking thyroid med daily  OA lodine about 2 x per week.    Review of Systems ROS:  GEN/ HEENTNo fever, significant weight changes sweats headaches vision problems hearing changes, CV/ PULM; No chest pain shortness of breath cough, syncope,edema  change in exercise tolerance. GI /GU: No adominal pain, vomiting, change in bowel habits. No blood in the stool. No hematuria but dos have some urgency and dysuria  SKIN/HEME: ,no acute skin rashes suspicious lesions or bleeding. No lymphadenopathy, nodules, masses.  NEURO/ PSYCH:  No neurologic signs such as weakness numbness No depression anxiety. IMM/ Allergy: No unusual infections.  Allergy .   REST of 12 system review negative  Past Medical History  Diagnosis Date  . Hypothyroidism   . Osteoarthritis   . Rosacea     History   Social History  . Marital Status: Married    Spouse Name: N/A    Number of Children: N/A  . Years of Education: N/A   Occupational History  . Not on file.   Social History Main Topics  . Smoking status: Former Games developer  . Smokeless tobacco: Not on file  . Alcohol Use: No  . Drug Use: No  . Sexually Active: Not on file   Other Topics Concern  . 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     Past Surgical History  Procedure Date  . Tonsillectomy     No family history on file.  No Known Allergies  Current Outpatient Prescriptions on File Prior to Visit  Medication Sig Dispense Refill  .  amitriptyline (ELAVIL) 100 MG tablet TAKE 3 TABLETS BY MOUTH EVERY DAY  90 tablet  2  . etodolac (LODINE) 500 MG tablet TAKE 1 TABLET BY MOUTH TWICE A DAY  60 tablet  3  . SYNTHROID 75 MCG tablet TAKE 1 TABLET BY MOUTH EVERY DAY  30 tablet  10    BP 100/60  Pulse 60  Ht 5\' 2"  (1.575 m)  Wt 121 lb (54.885 kg)  BMI 22.13 kg/m2       Objective:   Physical Exam Physical Exam: Vital signs reviewed WUJ:WJXB is a well-developed well-nourished alert cooperative  white female who appears her stated age in no acute distress.  HEENT: normocephalic  traumatic , Eyes: PERRL EOM's full, conjunctiva clear, Nares: paten,t no deformity discharge or tenderness., Ears: no deformity EAC's wax brown in both left  occluded   After irrigation left  TMs with normal landmarks. Some flakin wax still remains  Mouth: clear OP, no lesions, edema.  Moist mucous membranes. Dentition in adequate repair. NECK: supple without masses, thyromegaly or bruits. CHEST/PULM:  Clear to auscultation and percussion breath sounds equal no wheeze , rales or rhonchi. No chest wall deformities or tenderness. CV: PMI is nondisplaced, S1 S2 no gallops, murmurs, rubs. Peripheral pulses are full without delay.No JVD .  Breast: normal by inspection . No dimpling, discharge, masses,  tenderness or discharge .  ABDOMEN: Bowel sounds normal nontender  No guard or rebound, no hepato splenomegal no CVA tenderness.  No hernia. Extremtities:  No clubbing cyanosis or edema, no acute joint swelling or redness no focal atrophy OA changes hands  NEURO:  Oriented x3, cranial nerves 3-12 appear to be intact, no obvious focal weakness,gait within normal limits no abnormal reflexes or asymmetrical SKIN: No acute rashes normal turgor, color, no bruising or petechiae. Mild facial  erythema PSYCH: Oriented, good eye contact, no obvious depression anxiety, cognition and judgment appear normal. Pelvic: NL ext GU, labia clear without lesions or rash . Vagina  no lesions .Cervix: clear  UTERUS: Neg CMT Adnexa:  clear no masses . PAP done LN: no cervical axillary inguinal adenopathy    Lab Results  Component Value Date   WBC 8.0 07/07/2011   HGB 12.8 07/07/2011   HCT 38.0 07/07/2011   PLT 299.0 07/07/2011   GLUCOSE 69* 07/07/2011   CHOL 184 07/07/2011   TRIG 73.0 07/07/2011   HDL 56.30 07/07/2011   LDLDIRECT 132.5 06/23/2010   LDLCALC 113* 07/07/2011   ALT 8 07/07/2011   AST 14 07/07/2011   NA 139 07/07/2011   K 4.3 07/07/2011   CL 104 07/07/2011   CREATININE 1.1 07/07/2011   BUN 8 07/07/2011   CO2 26 07/07/2011   TSH 0.50 07/07/2011   HGBA1C 5.5 06/18/2008         Assessment & Plan:  Preventive Health Care Counseled regarding healthy nutrition, exercise, sleep, injury prevention, calcium vit d and healthy weight . Still tobacco free  Slight edema poss from venous and standing  Albumin borderline but doubt from this will follow. Thyroid   No change in meds  Ear  Seems related to wax   Can use softening drops if needed UTI sx   Get cx and rx  NO  insurance  Disc  Getting low cost mammo if possible. Sleep no change    UTI  For a month  Get culture   And treat.  Rosacea stable

## 2011-07-17 LAB — URINE CULTURE

## 2011-07-19 ENCOUNTER — Encounter: Payer: Self-pay | Admitting: Internal Medicine

## 2011-07-19 DIAGNOSIS — N390 Urinary tract infection, site not specified: Secondary | ICD-10-CM | POA: Insufficient documentation

## 2011-07-19 DIAGNOSIS — H9190 Unspecified hearing loss, unspecified ear: Secondary | ICD-10-CM | POA: Insufficient documentation

## 2011-07-19 DIAGNOSIS — H6120 Impacted cerumen, unspecified ear: Secondary | ICD-10-CM | POA: Insufficient documentation

## 2011-07-21 ENCOUNTER — Telehealth: Payer: Self-pay | Admitting: *Deleted

## 2011-07-21 MED ORDER — SULFAMETHOXAZOLE-TRIMETHOPRIM 800-160 MG PO TABS: 1.0000 | ORAL_TABLET | Freq: Two times a day (BID) | ORAL | Status: AC

## 2011-07-21 NOTE — Telephone Encounter (Signed)
Pt is having still having the itching and burning from the infection. Pt states that it never went away. She has finished the antibiotic.

## 2011-07-21 NOTE — Telephone Encounter (Signed)
Pt aware of results 

## 2011-07-21 NOTE — Telephone Encounter (Signed)
If not allergic  Change to septra ds  1 po bid for 7 days disp 14

## 2011-07-27 ENCOUNTER — Other Ambulatory Visit: Payer: Self-pay | Admitting: Internal Medicine

## 2011-07-27 DIAGNOSIS — Z23 Encounter for immunization: Secondary | ICD-10-CM

## 2011-07-31 ENCOUNTER — Other Ambulatory Visit: Payer: Self-pay | Admitting: Internal Medicine

## 2011-09-16 ENCOUNTER — Ambulatory Visit (INDEPENDENT_AMBULATORY_CARE_PROVIDER_SITE_OTHER): Payer: Self-pay

## 2011-09-16 DIAGNOSIS — R05 Cough: Secondary | ICD-10-CM

## 2011-09-16 DIAGNOSIS — J40 Bronchitis, not specified as acute or chronic: Secondary | ICD-10-CM

## 2011-09-16 DIAGNOSIS — J449 Chronic obstructive pulmonary disease, unspecified: Secondary | ICD-10-CM

## 2011-09-16 DIAGNOSIS — T65291A Toxic effect of other tobacco and nicotine, accidental (unintentional), initial encounter: Secondary | ICD-10-CM

## 2011-10-26 ENCOUNTER — Other Ambulatory Visit: Payer: Self-pay | Admitting: Internal Medicine

## 2011-10-26 NOTE — Telephone Encounter (Signed)
Ok x 3 each

## 2011-11-12 ENCOUNTER — Ambulatory Visit (INDEPENDENT_AMBULATORY_CARE_PROVIDER_SITE_OTHER): Payer: Self-pay | Admitting: Family

## 2011-11-12 ENCOUNTER — Encounter: Payer: Self-pay | Admitting: Family

## 2011-11-12 VITALS — BP 120/80 | Temp 97.5°F | Wt 117.0 lb

## 2011-11-12 DIAGNOSIS — R05 Cough: Secondary | ICD-10-CM

## 2011-11-12 DIAGNOSIS — J209 Acute bronchitis, unspecified: Secondary | ICD-10-CM

## 2011-11-12 MED ORDER — DOXYCYCLINE HYCLATE 100 MG PO TABS: 100.0000 mg | ORAL_TABLET | Freq: Two times a day (BID) | ORAL | Status: AC

## 2011-11-12 MED ORDER — METHYLPREDNISOLONE 4 MG PO KIT: PACK | ORAL | Status: AC

## 2011-11-12 MED ORDER — DEXTROMETHORPHAN HBR 15 MG/5ML PO SYRP: 10.0000 mL | ORAL_SOLUTION | Freq: Four times a day (QID) | ORAL | Status: AC | PRN

## 2011-11-12 NOTE — Patient Instructions (Signed)

## 2011-11-12 NOTE — Progress Notes (Signed)
  Subjective:    Patient ID: [name], female    DOB: [birth year], 66 y.o.   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 She was previously treated for pneumonia in February by the urgent care clinic with Levaquin. Reports the symptoms never completely resolved since then. Now they are worsening. She denies any fever, aches or pain.   Review of Systems  Constitutional: Negative.   HENT: Positive for congestion, rhinorrhea and postnasal drip.   Respiratory: Positive for cough and wheezing. Negative for shortness of breath.   Gastrointestinal: Negative.   Musculoskeletal: Negative.   Neurological: Negative.   Hematological: Negative.   Psychiatric/Behavioral: Negative.    Past Medical History  Diagnosis Date  . Hypothyroidism   . Osteoarthritis   . Rosacea     History   Social History  . Marital Status: Married    Spouse Name: N/A    Number of Children: N/A  . Years of Education: N/A   Occupational History  . Not on file.   Social History Main Topics  . Smoking status: Former Games developer  . Smokeless tobacco: Not on file  . Alcohol Use: No  . Drug Use: No  . Sexually Active: Not on file   Other Topics Concern  . 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 No health insurance self employed     Past Surgical History  Procedure Date  . Tonsillectomy     No family history on file.  No Known Allergies  Current Outpatient Prescriptions on File Prior to Visit  Medication Sig Dispense Refill  . amitriptyline (ELAVIL) 100 MG tablet TAKE 3 TABLETS BY MOUTH EVERY DAY  90 tablet  2  . etodolac (LODINE) 500 MG tablet TAKE 1 TABLET BY MOUTH TWICE A DAY  60 tablet  2  . SYNTHROID 75 MCG tablet TAKE 1 TABLET BY MOUTH EVERY DAY  30 tablet  10    BP  120/80  Temp(Src) 97.5 F (36.4 C) (Oral)  Wt 117 lb (53.071 kg)chart    Objective:   Physical Exam  Constitutional: She is oriented to person, place, and time. She appears well-developed and well-nourished.  HENT:  Right Ear: External ear normal.  Left Ear: External ear normal.  Nose: Nose normal.  Mouth/Throat: Oropharynx is clear and moist.  Neck: Normal range of motion. Neck supple.  Cardiovascular: Normal rate, regular rhythm and normal heart sounds.   Pulmonary/Chest: Effort normal. She has wheezes.  Abdominal: Soft. Bowel sounds are normal.  Neurological: She is alert and oriented to person, place, and time.  Skin: Skin is warm and dry.  Psychiatric: She has a normal mood and affect.          Assessment & Plan:  Assessment: Bronchitis, cough  Plan: Doxycycline 100 mg twice a day x10 days, Medrol Dosepak as directed, Robitussin with codeine 1 teaspoon every 6 hours when necessary cough. Patient to call the office if symptoms worsen or persist. Recheck a schedule, and when necessary.

## 2012-01-24 ENCOUNTER — Other Ambulatory Visit: Payer: Self-pay | Admitting: Internal Medicine

## 2012-04-14 ENCOUNTER — Other Ambulatory Visit: Payer: Self-pay | Admitting: Internal Medicine

## 2012-04-19 ENCOUNTER — Telehealth: Payer: Self-pay | Admitting: Internal Medicine

## 2012-04-19 NOTE — Telephone Encounter (Signed)
[facility] for pts etodolac (LODINE) 500 MG tablet and amitriptyline (ELAVIL) 100 MG tablet.

## 2012-04-19 NOTE — Telephone Encounter (Signed)
Patient returned my call.  Tried calling her back.  Left message on voicemail.

## 2012-04-19 NOTE — Telephone Encounter (Signed)
Left message on mobile for the pt to return my call.  Must find out how much of each she is taking per [facility].

## 2012-04-19 NOTE — Telephone Encounter (Signed)
Trying to reach the patient to find out how much of each she is taking per [facility].  See medication refill task.

## 2012-04-20 ENCOUNTER — Other Ambulatory Visit: Payer: Self-pay | Admitting: Family Medicine

## 2012-04-20 MED ORDER — ETODOLAC ER 600 MG PO TB24: 600.0000 mg | ORAL_TABLET | Freq: Every day | ORAL | Status: DC

## 2012-04-20 MED ORDER — AMITRIPTYLINE HCL 100 MG PO TABS: 100.0000 mg | ORAL_TABLET | Freq: Three times a day (TID) | ORAL | Status: DC | PRN

## 2012-04-20 NOTE — Telephone Encounter (Signed)
Left message on voicemail for the pt to return my call. 

## 2012-04-22 ENCOUNTER — Telehealth: Payer: Self-pay | Admitting: Family Medicine

## 2012-04-22 MED ORDER — ETODOLAC 400 MG PO TABS: ORAL_TABLET | ORAL | Status: DC

## 2012-04-22 NOTE — Telephone Encounter (Signed)
Sent in 400mg  1 tab q8hr prn.  She will call if there is a problem with the cost.

## 2012-04-22 NOTE — Telephone Encounter (Signed)
The patient called to report that the Lodine 600XL is $104.  Are there other medications to try?  Should she start back on old med?  Please advise.

## 2012-04-22 NOTE — Telephone Encounter (Signed)
Ok to do 400 mg   Immediate release and can take tid maximum dose   1200 mg ( ie 3 per day. ) check into this cost .

## 2012-05-09 ENCOUNTER — Other Ambulatory Visit: Payer: Self-pay | Admitting: Internal Medicine

## 2012-05-11 NOTE — Telephone Encounter (Signed)
Pharm is aware MD was out of office until today 05-11-2012

## 2012-05-12 ENCOUNTER — Telehealth: Payer: Self-pay | Admitting: Family Medicine

## 2012-05-12 NOTE — Telephone Encounter (Signed)
Pt requesting refills.  Seen last on 07/14/11 CPE.  Has CPE scheduled for 08/01/12.  Please advise.  Thanks!!!

## 2012-05-14 NOTE — Telephone Encounter (Signed)
Can refill x 2  

## 2012-05-16 NOTE — Telephone Encounter (Signed)
Complete

## 2012-06-14 ENCOUNTER — Telehealth: Payer: Self-pay | Admitting: Family Medicine

## 2012-06-14 NOTE — Telephone Encounter (Signed)
Pt will soon be turning 66 and will be on Medicare.  She is calling to make sure you will still accept her as a pt after the change.  Please advise.

## 2012-06-15 NOTE — Telephone Encounter (Signed)
Of course!

## 2012-06-15 NOTE — Telephone Encounter (Signed)
Left message on voicemail for the pt to return my call. 

## 2012-06-17 NOTE — Telephone Encounter (Signed)
Left message for the pt to return my call. 

## 2012-06-17 NOTE — Telephone Encounter (Signed)
Pt's husband notified by telephone.  

## 2012-06-23 ENCOUNTER — Other Ambulatory Visit: Payer: Self-pay | Admitting: Internal Medicine

## 2012-08-01 ENCOUNTER — Ambulatory Visit (INDEPENDENT_AMBULATORY_CARE_PROVIDER_SITE_OTHER): Payer: Medicare Other | Admitting: Internal Medicine

## 2012-08-01 ENCOUNTER — Encounter: Payer: Self-pay | Admitting: Internal Medicine

## 2012-08-01 VITALS — BP 136/90 | HR 104 | Temp 98.3°F | Ht 61.75 in | Wt 115.0 lb

## 2012-08-01 DIAGNOSIS — N390 Urinary tract infection, site not specified: Secondary | ICD-10-CM

## 2012-08-01 DIAGNOSIS — K5900 Constipation, unspecified: Secondary | ICD-10-CM

## 2012-08-01 DIAGNOSIS — Z23 Encounter for immunization: Secondary | ICD-10-CM

## 2012-08-01 DIAGNOSIS — Z0000 Encounter for general adult medical examination without abnormal findings: Secondary | ICD-10-CM

## 2012-08-01 DIAGNOSIS — R7301 Impaired fasting glucose: Secondary | ICD-10-CM

## 2012-08-01 DIAGNOSIS — Z1211 Encounter for screening for malignant neoplasm of colon: Secondary | ICD-10-CM

## 2012-08-01 DIAGNOSIS — R300 Dysuria: Secondary | ICD-10-CM

## 2012-08-01 DIAGNOSIS — Z139 Encounter for screening, unspecified: Secondary | ICD-10-CM

## 2012-08-01 DIAGNOSIS — Z79899 Other long term (current) drug therapy: Secondary | ICD-10-CM

## 2012-08-01 DIAGNOSIS — E039 Hypothyroidism, unspecified: Secondary | ICD-10-CM

## 2012-08-01 DIAGNOSIS — R12 Heartburn: Secondary | ICD-10-CM

## 2012-08-01 DIAGNOSIS — M1990 Unspecified osteoarthritis, unspecified site: Secondary | ICD-10-CM

## 2012-08-01 DIAGNOSIS — H6120 Impacted cerumen, unspecified ear: Secondary | ICD-10-CM

## 2012-08-01 LAB — POCT URINALYSIS DIPSTICK
Blood, UA: NEGATIVE
Glucose, UA: NEGATIVE
Nitrite, UA: NEGATIVE
Protein, UA: NEGATIVE
Urobilinogen, UA: 0.2
pH, UA: 7.5

## 2012-08-01 LAB — CBC WITH DIFFERENTIAL/PLATELET
Basophils Absolute: 0 10*3/uL (ref 0.0–0.1)
HCT: 38.1 % (ref 36.0–46.0)
Hemoglobin: 12.6 g/dL (ref 12.0–15.0)
Lymphs Abs: 1.7 10*3/uL (ref 0.7–4.0)
MCHC: 33.1 g/dL (ref 30.0–36.0)
MCV: 99.3 fl (ref 78.0–100.0)
Monocytes Absolute: 0.4 10*3/uL (ref 0.1–1.0)
Neutro Abs: 3.1 10*3/uL (ref 1.4–7.7)
Platelets: 283 10*3/uL (ref 150.0–400.0)
RDW: 12.9 % (ref 11.5–14.6)

## 2012-08-01 LAB — LIPID PANEL
Cholesterol: 198 mg/dL (ref 0–200)
HDL: 54.8 mg/dL (ref >39.00)
Triglycerides: 114 mg/dL (ref 0.0–149.0)

## 2012-08-01 LAB — BASIC METABOLIC PANEL
BUN: 13 mg/dL (ref 6–23)
CO2: 26 mEq/L (ref 19–32)
GFR: 62 mL/min (ref >60.00)
Glucose, Bld: 114 mg/dL — ABNORMAL HIGH (ref 70–99)
Potassium: 4.3 mEq/L (ref 3.5–5.1)

## 2012-08-01 LAB — HEPATIC FUNCTION PANEL
ALT: 8 U/L (ref 0–35)
AST: 14 U/L (ref 0–37)
Albumin: 3.6 g/dL (ref 3.5–5.2)
Alkaline Phosphatase: 76 U/L (ref 39–117)
Total Protein: 6.9 g/dL (ref 6.0–8.3)

## 2012-08-01 NOTE — Progress Notes (Signed)
Chief Complaint  Patient presents with  . Annual Exam    Medicare, ear clogged   . Hypothyroidism  . Osteoarthritis    HPI: Patient comes in today for Preventive Health Care visit  Now on medicare  and has insurance. Update of a number of concerns.  Problem with bms constipation  For 5 months no blood  takea a laxative at times and 2 correctol and mag citrate .  No abd pain   Has some indigestion at times  Takes zanac 150 with some help.   Taking 2 per day.  Of etodolac for her arthritis  Knee pain Right ear blocked for a few months   Hard to hear.  Still taking elavil for sleep etc . No change in thyroid med.  ROS:  GEN/ HEENT: No fever, significant weight changes sweats headaches vision problems hearing changes, CV/ PULM; No chest pain shortness of breath cough, syncope,edema  change in exercise tolerance. GI /GU: No adominal pain, vomiting,. Poss heart burn  No blood in the stool.   Having some burning  cloudy urine SKIN/HEME: ,no acute skin rashes suspicious lesions or bleeding. No lymphadenopathy, nodules, masses.  NEURO/ PSYCH:  No neurologic signs such as weakness numbness. No depression anxiety. IMM/ Allergy: No unusual infections.  Allergy .   REST of 12 system review negative except as per HPI   Hearing: dec with wax better after removal   Vision:  No limitations at present . Last eye check UTD dr Yetta Barre  Safety:  Has smoke detector and wears seat belts.  No firearms. No excess sun exposure. Sees dentist regularly.  Falls:   Advance directive :  Reviewed  Has one.  Memory: Felt to be good  , no concern from her or her family.  Depression: No anhedonia unusual crying or depressive symptoms  Nutrition: Eats well balanced diet; adequate calcium and vitamin D. No swallowing chewing problems.  Injury: no major injuries in the last six months.  Other healthcare providers:  Reviewed today .  Social:  Lives with spouse married. No pets.   Preventive parameters:  up-to-date  Reviewed   ADLS:   There are no problems or need for assistance  driving, feeding, obtaining food, dressing, toileting and bathing, managing money using phone. She is independent.  EXERCISE/ HABITS  Per week  Walking  30 - 60 minutes  No tobacco    etoh    Past Medical History  Diagnosis Date  . Hypothyroidism   . Osteoarthritis   . Rosacea    Mom lived 52 father 60  Sister healthy Family History  Problem Relation Age of Onset  . Heart disease      History   Social History  . Marital Status: Married    Spouse Name: N/A    Number of Children: N/A  . Years of Education: N/A   Social History Main Topics  . Smoking status: Former Games developer  . Smokeless tobacco: None  . Alcohol Use: No  . Drug Use: No  . Sexually Active: None   Other Topics Concern  . 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 No health insurance self employed  Now on medicare 2 dogs     Outpatient Encounter Prescriptions as of 08/01/2012  Medication Sig Dispense Refill  . amitriptyline (ELAVIL) 100 MG tablet TAKE 1 TABLET THREE TIMES A DAY AS NEEDED  FOR SLEEP  90 tablet  1  . etodolac (LODINE) 500 MG tablet TAKE 1 TABLET BY MOUTH TWICE A DAY  60 tablet  5  . SYNTHROID 75 MCG tablet TAKE 1 TABLET BY MOUTH EVERY DAY  30 each  2  . [DISCONTINUED] etodolac (LODINE) 400 MG tablet Take 1 tab every 8 hours as needed  90 tablet  0    EXAM:  BP 136/90  Pulse 104  Temp 98.3 F (36.8 C) (Oral)  Ht 5' 1.75" (1.568 m)  Wt 115 lb (52.164 kg)  BMI 21.20 kg/m2  SpO2 97%  Body mass index is 21.20 kg/(m^2).  Physical Exam: Vital signs reviewed QMV:HQIO is a well-developed well-nourished alert cooperative   female who appears her stated age in no acute distress.  HEENT: normocephalic atraumatic , Eyes: PERRL EOM's full, conjunctiva clear, Nares: paten,t no deformity discharge or  tenderness., Ears: no deformity EAC's right clogged with soft brown wax  Irrigated and tim intact  TMs with normal landmarks. Mouth: clear OP, no lesions, edema.  Moist mucous membranes. Dentition in adequate repair. NECK: supple without masses, thyromegaly or bruits. CHEST/PULM:  Clear to auscultation and percussion breath sounds equal no wheeze , rales or rhonchi. No chest wall deformities or tenderness. CV: PMI is nondisplaced, S1 S2 no gallops, murmurs, rubs. Peripheral pulses are full without delay.No JVD .  Breast: normal by inspection . No dimpling, discharge, masses, tenderness or discharge . ABDOMEN: Bowel sounds normal nontender  No guard or rebound, no hepato splenomegal no CVA tenderness.  No hernia. Extremtities:  No clubbing cyanosis or edema, no acute joint swelling or redness no focal atrophy has OA changes hands  NEURO:  Oriented x3, cranial nerves 3-12 appear to be intact, no obvious focal weakness,gait within normal limits no abnormal reflexes or asymmetrical SKIN: No acute rashes normal turgor, color, no bruising or petechiae. PSYCH: Oriented, good eye contact, no obvious depression anxiety, cognition and judgment appear normal. LN: no cervical axillary inguinal adenopathy  Lab Results  Component Value Date   WBC 5.7 08/01/2012   HGB 12.6 08/01/2012   HCT 38.1 08/01/2012   PLT 283.0 08/01/2012   GLUCOSE 114* 08/01/2012   CHOL 198 08/01/2012   TRIG 114.0 08/01/2012   HDL 54.80 08/01/2012   LDLDIRECT 132.5 06/23/2010   LDLCALC 120* 08/01/2012   ALT 8 08/01/2012   AST 14 08/01/2012   NA 136 08/01/2012   K 4.3 08/01/2012   CL 104 08/01/2012   CREATININE 1.0 08/01/2012   BUN 13 08/01/2012   CO2 26 08/01/2012   TSH 0.62 08/01/2012   HGBA1C 5.5 06/18/2008    ASSESSMENT AND PLAN:  Discussed the following assessment and plan:  1. Medicare welcome visit    2. Hypothyroidism  Lipid panel, CBC with Differential, Basic metabolic panel, TSH, Hepatic function panel  3. Fasting  hyperglycemia  Lipid panel, CBC with Differential, Basic metabolic panel, TSH, Hepatic function panel  4. Dysuria  POC Urinalysis Dipstick  5. Constipation  Ambulatory referral to Gastroenterology   sx rx gi   6. Heartburn  Ambulatory referral to Gastroenterology  7. OSTEOARTHRITIS    8. Wax in ear     removed   9. Medication management     on nsaid   monitor risk  10. Flu vaccine need  Flu vaccine greater than or equal to 3yo preservative free IM  11. Need for pneumococcal vaccination  Pneumococcal conjugate vaccine 13-valent less than 5yo IM  12. UTI (lower urinary tract  infection)  Urine culture, Urine culture  13. Colon cancer screening  Ambulatory referral to Gastroenterology    Patient Care Team: Madelin Headings, MD as PCP - General Patient Instructions  Will notify you  of labs when available. Someone will contact you about a referral to gastroenterology regarding your heartburn constipation and need for colonoscopy. The anti-inflammatory she take for your arthritis can bother your stomach. Suggest she try Prilosec once a day for the next 2 weeks and see if this helps then as needed.  In regard to your constipation you can try MiraLax one capful a day until stools are soft enough and then as needed.  We'll do a Pap smear next year.  Constipation, Adult Constipation is when a person has fewer than 3 bowel movements a week; has difficulty having a bowel movement; or has stools that are dry, hard, or larger than normal. As people grow older, constipation is more common. If you try to fix constipation with medicines that make you have a bowel movement (laxatives), the problem may get worse. Long-term laxative use may cause the muscles of the colon to become weak. A low-fiber diet, not taking in enough fluids, and taking certain medicines may make constipation worse. CAUSES   Certain medicines, such as antidepressants, pain medicine, iron supplements, antacids, and water pills.    Certain diseases, such as diabetes, irritable bowel syndrome (IBS), thyroid disease, or depression.   Not drinking enough water.   Not eating enough fiber-rich foods.   Stress or travel.  Lack of physical activity or exercise.  Not going to the restroom when there is the urge to have a bowel movement.  Ignoring the urge to have a bowel movement.  Using laxatives too much. SYMPTOMS   Having fewer than 3 bowel movements a week.   Straining to have a bowel movement.   Having hard, dry, or larger than normal stools.   Feeling full or bloated.   Pain in the lower abdomen.  Not feeling relief after having a bowel movement. DIAGNOSIS  Your caregiver will take a medical history and perform a physical exam. Further testing may be done for severe constipation. Some tests may include:   A barium enema X-ray to examine your rectum, colon, and sometimes, your small intestine.  A sigmoidoscopy to examine your lower colon.  A colonoscopy to examine your entire colon. TREATMENT  Treatment will depend on the severity of your constipation and what is causing it. Some dietary treatments include drinking more fluids and eating more fiber-rich foods. Lifestyle treatments may include regular exercise. If these diet and lifestyle recommendations do not help, your caregiver may recommend taking over-the-counter laxative medicines to help you have bowel movements. Prescription medicines may be prescribed if over-the-counter medicines do not work.  HOME CARE INSTRUCTIONS   Increase dietary fiber in your diet, such as fruits, vegetables, whole grains, and beans. Limit high-fat and processed sugars in your diet, such as [place] fries, hamburgers, cookies, candies, and soda.   A fiber supplement may be added to your diet if you cannot get enough fiber from foods.   Drink enough fluids to keep your urine clear or pale yellow.   Exercise regularly or as directed by your caregiver.    Go to the restroom when you have the urge to go. Do not hold it.  Only take medicines as directed by your caregiver. Do not take other medicines for constipation without talking to your caregiver first. SEEK IMMEDIATE MEDICAL CARE IF:  You have bright red blood in your stool.   Your constipation lasts for more than 4 days or gets worse.   You have abdominal or rectal pain.   You have thin, pencil-like stools.  You have unexplained weight loss. MAKE SURE YOU:   Understand these instructions.  Will watch your condition.  Will get help right away if you are not doing well or get worse. Document Released: 05/08/2004 Document Revised: 11/02/2011 Document Reviewed: 07/14/2011 [facility] Patient Information 2013 [place], [place].  Preventive Care for Adults, Female A healthy lifestyle and preventive care can promote health and wellness. Preventive health guidelines for women include the following key practices.  A routine yearly physical is a good way to check with your caregiver about your health and preventive screening. It is a chance to share any concerns and updates on your health, and to receive a thorough exam.  Visit your dentist for a routine exam and preventive care every 6 months. Brush your teeth twice a day and floss once a day. Good oral hygiene prevents tooth decay and gum disease.  The frequency of eye exams is based on your age, health, family medical history, use of contact lenses, and other factors. Follow your caregiver's recommendations for frequency of eye exams.  Eat a healthy diet. Foods like vegetables, fruits, whole grains, low-fat dairy products, and lean protein foods contain the nutrients you need without too many calories. Decrease your intake of foods high in solid fats, added sugars, and salt. Eat the right amount of calories for you.Get information about a proper diet from your caregiver, if necessary.  Regular physical exercise is one of the most  important things you can do for your health. Most adults should get at least 150 minutes of moderate-intensity exercise (any activity that increases your heart rate and causes you to sweat) each week. In addition, most adults need muscle-strengthening exercises on 2 or more days a week.  Maintain a healthy weight. The body mass index (BMI) is a screening tool to identify possible weight problems. It provides an estimate of body fat based on height and weight. Your caregiver can help determine your BMI, and can help you achieve or maintain a healthy weight.For adults 20 years and older:  A BMI below 18.5 is considered underweight.  A BMI of 18.5 to 24.9 is normal.  A BMI of 25 to 29.9 is considered overweight.  A BMI of 30 and above is considered obese.  Maintain normal blood lipids and cholesterol levels by exercising and minimizing your intake of saturated fat. Eat a balanced diet with plenty of fruit and vegetables. Blood tests for lipids and cholesterol should begin at age 66 and be repeated every 5 years. If your lipid or cholesterol levels are high, you are over 50, or you are at high risk for heart disease, you may need your cholesterol levels checked more frequently.Ongoing high lipid and cholesterol levels should be treated with medicines if diet and exercise are not effective.  If you smoke, find out from your caregiver how to quit. If you do not use tobacco, do not start.  If you are pregnant, do not drink alcohol. If you are breastfeeding, be very cautious about drinking alcohol. If you are not pregnant and choose to drink alcohol, do not exceed 1 drink per day. One drink is considered to be 12 ounces (355 mL) of beer, 5 ounces (148 mL) of wine, or 1.5 ounces (44 mL) of liquor.  Avoid use of street drugs.  Do not share needles with anyone. Ask for help if you need support or instructions about stopping the use of drugs.  High blood pressure causes heart disease and increases the risk  of stroke. Your blood pressure should be checked at least every 1 to 2 years. Ongoing high blood pressure should be treated with medicines if weight loss and exercise are not effective.  If you are 8 to 65 years old, ask your caregiver if you should take aspirin to prevent strokes.  Diabetes screening involves taking a blood sample to check your fasting blood sugar level. This should be done once every 3 years, after age 25, if you are within normal weight and without risk factors for diabetes. Testing should be considered at a younger age or be carried out more frequently if you are overweight and have at least 1 risk factor for diabetes.  Breast cancer screening is essential preventive care for women. You should practice "breast self-awareness." This means understanding the normal appearance and feel of your breasts and may include breast self-examination. Any changes detected, no matter how small, should be reported to a caregiver. Women in their 84s and 30s should have a clinical breast exam (CBE) by a caregiver as part of a regular health exam every 1 to 3 years. After age 42, women should have a CBE every year. Starting at age 38, women should consider having a mammography (breast X-ray test) every year. Women who have a family history of breast cancer should talk to their caregiver about genetic screening. Women at a high risk of breast cancer should talk to their caregivers about having magnetic resonance imaging (MRI) and a mammography every year.  The Pap test is a screening test for cervical cancer. A Pap test can show cell changes on the cervix that might become cervical cancer if left untreated. A Pap test is a procedure in which cells are obtained and examined from the lower end of the uterus (cervix).  Women should have a Pap test starting at age 73.  Between ages 60 and 59, Pap tests should be repeated every 2 years.  Beginning at age 74, you should have a Pap test every 3 years as  long as the past 3 Pap tests have been normal.  Some women have medical problems that increase the chance of getting cervical cancer. Talk to your caregiver about these problems. It is especially important to talk to your caregiver if a new problem develops soon after your last Pap test. In these cases, your caregiver may recommend more frequent screening and Pap tests.  The above recommendations are the same for women who have or have not gotten the vaccine for human papillomavirus (HPV).  If you had a hysterectomy for a problem that was not cancer or a condition that could lead to cancer, then you no longer need Pap tests. Even if you no longer need a Pap test, a regular exam is a good idea to make sure no other problems are starting.  If you are between ages 72 and 29, and you have had normal Pap tests going back 10 years, you no longer need Pap tests. Even if you no longer need a Pap test, a regular exam is a good idea to make sure no other problems are starting.  If you have had past treatment for cervical cancer or a condition that could lead to cancer, you need Pap tests and screening for cancer for at least 20 years after your treatment.  If Pap tests have been discontinued, risk factors (such as a new sexual partner) need to be reassessed to determine if screening should be resumed.  The HPV test is an additional test that may be used for cervical cancer screening. The HPV test looks for the virus that can cause the cell changes on the cervix. The cells collected during the Pap test can be tested for HPV. The HPV test could be used to screen women aged 73 years and older, and should be used in women of any age who have unclear Pap test results. After the age of 2, women should have HPV testing at the same frequency as a Pap test.  Colorectal cancer can be detected and often prevented. Most routine colorectal cancer screening begins at the age of 58 and continues through age 35. However,  your caregiver may recommend screening at an earlier age if you have risk factors for colon cancer. On a yearly basis, your caregiver may provide home test kits to check for hidden blood in the stool. Use of a small camera at the end of a tube, to directly examine the colon (sigmoidoscopy or colonoscopy), can detect the earliest forms of colorectal cancer. Talk to your caregiver about this at age 32, when routine screening begins. Direct examination of the colon should be repeated every 5 to 10 years through age 19, unless early forms of pre-cancerous polyps or small growths are found.  Hepatitis C blood testing is recommended for all people born from [birth year] through 1964 and any individual with known risks for hepatitis C.  Practice safe sex. Use condoms and avoid high-risk sexual practices to reduce the spread of sexually transmitted infections (STIs). STIs include gonorrhea, chlamydia, syphilis, trichomonas, herpes, HPV, and human immunodeficiency virus (HIV). Herpes, HIV, and HPV are viral illnesses that have no cure. They can result in disability, cancer, and death. Sexually active women aged 24 and younger should be checked for chlamydia. Older women with new or multiple partners should also be tested for chlamydia. Testing for other STIs is recommended if you are sexually active and at increased risk.  Osteoporosis is a disease in which the bones lose minerals and strength with aging. This can result in serious bone fractures. The risk of osteoporosis can be identified using a bone density scan. Women ages 12 and over and women at risk for fractures or osteoporosis should discuss screening with their caregivers. Ask your caregiver whether you should take a calcium supplement or vitamin D to reduce the rate of osteoporosis.  Menopause can be associated with physical symptoms and risks. Hormone replacement therapy is available to decrease symptoms and risks. You should talk to your caregiver about whether  hormone replacement therapy is right for you.  Use sunscreen with sun protection factor (SPF) of 30 or more. Apply sunscreen liberally and repeatedly throughout the day. You should seek shade when your shadow is shorter than you. Protect yourself by wearing long sleeves, pants, a wide-brimmed hat, and sunglasses year round, whenever you are outdoors.  Once a month, do a whole body skin exam, using a mirror to look at the skin on your back. Notify your caregiver of new moles, moles that have irregular borders, moles that are larger than a pencil eraser, or moles that have changed in shape or color.  Stay current with required immunizations.  Influenza. You need a dose every fall (or winter). The composition of the flu vaccine changes each year, so being vaccinated once is not enough.  Pneumococcal polysaccharide. You need 1 to 2 doses if you smoke cigarettes or if you have certain chronic medical conditions. You need 1 dose at age 8 (or older) if you have never been vaccinated.  Tetanus, diphtheria, pertussis (Tdap, Td). Get 1 dose of Tdap vaccine if you are younger than age 20, are over 40 and have contact with an infant, are a Research scientist (physical sciences), are pregnant, or simply want to be protected from whooping cough. After that, you need a Td booster dose every 10 years. Consult your caregiver if you have not had at least 3 tetanus and diphtheria-containing shots sometime in your life or have a deep or dirty wound.  HPV. You need this vaccine if you are a woman age 17 or younger. The vaccine is given in 3 doses over 6 months.  Measles, mumps, rubella (MMR). You need at least 1 dose of MMR if you were born in 1957 or later. You may also need a second dose.  Meningococcal. If you are age 53 to 91 and a first-year college student living in a residence hall, or have one of several medical conditions, you need to get vaccinated against meningococcal disease. You may also need additional booster  doses.  Zoster (shingles). If you are age 36 or older, you should get this vaccine.  Varicella (chickenpox). If you have never had chickenpox or you were vaccinated but received only 1 dose, talk to your caregiver to find out if you need this vaccine.  Hepatitis A. You need this vaccine if you have a specific risk factor for hepatitis A virus infection or you simply wish to be protected from this disease. The vaccine is usually given as 2 doses, 6 to 18 months apart.  Hepatitis B. You need this vaccine if you have a specific risk factor for hepatitis B virus infection or you simply wish to be protected from this disease. The vaccine is given in 3 doses, usually over 6 months. Preventive Services / Frequency Ages 68 to 32  Blood pressure check.** / Every 1 to 2 years.  Lipid and cholesterol check.** / Every 5 years beginning at age 58.  Clinical breast exam.** / Every 3 years for women in their 63s and 30s.  Pap test.** / Every 2 years from ages 10 through 53. Every 3 years starting at age 19 through age 14 or 3 with a history of 3 consecutive normal Pap tests.  HPV screening.** / Every 3 years from ages 41 through ages 66 to 77 with a history of 3 consecutive normal Pap tests.  Hepatitis C blood test.** / For any individual with known risks for hepatitis C.  Skin self-exam. / Monthly.  Influenza immunization.** / Every year.  Pneumococcal polysaccharide immunization.** / 1 to 2 doses if you smoke cigarettes or if you have certain chronic medical conditions.  Tetanus, diphtheria, pertussis (Tdap, Td) immunization. / A one-time dose of Tdap vaccine. After that, you need a Td booster dose every 10 years.  HPV immunization. / 3 doses over 6 months, if you are 21 and younger.  Measles, mumps, rubella (MMR) immunization. / You need at least 1 dose of MMR if you were born in 1957 or later. You may also need a second dose.  Meningococcal immunization. / 1 dose if you are age 8 to 54 and  a first-year college student living in a residence hall, or have one of several medical conditions, you need to get vaccinated against meningococcal disease. You may also need  additional booster doses.  Varicella immunization.** / Consult your caregiver.  Hepatitis A immunization.** / Consult your caregiver. 2 doses, 6 to 18 months apart.  Hepatitis B immunization.** / Consult your caregiver. 3 doses usually over 6 months. Ages 3 to 36  Blood pressure check.** / Every 1 to 2 years.  Lipid and cholesterol check.** / Every 5 years beginning at age 56.  Clinical breast exam.** / Every year after age 5.  Mammogram.** / Every year beginning at age 14 and continuing for as long as you are in good health. Consult with your caregiver.  Pap test.** / Every 3 years starting at age 72 through age 78 or 71 with a history of 3 consecutive normal Pap tests.  HPV screening.** / Every 3 years from ages 66 through ages 33 to 64 with a history of 3 consecutive normal Pap tests.  Fecal occult blood test (FOBT) of stool. / Every year beginning at age 64 and continuing until age 80. You may not need to do this test if you get a colonoscopy every 10 years.  Flexible sigmoidoscopy or colonoscopy.** / Every 5 years for a flexible sigmoidoscopy or every 10 years for a colonoscopy beginning at age 56 and continuing until age 32.  Hepatitis C blood test.** / For all people born from [birth year] through 1964 and any individual with known risks for hepatitis C.  Skin self-exam. / Monthly.  Influenza immunization.** / Every year.  Pneumococcal polysaccharide immunization.** / 1 to 2 doses if you smoke cigarettes or if you have certain chronic medical conditions.  Tetanus, diphtheria, pertussis (Tdap, Td) immunization.** / A one-time dose of Tdap vaccine. After that, you need a Td booster dose every 10 years.  Measles, mumps, rubella (MMR) immunization. / You need at least 1 dose of MMR if you were born in 1957 or  later. You may also need a second dose.  Varicella immunization.** / Consult your caregiver.  Meningococcal immunization.** / Consult your caregiver.  Hepatitis A immunization.** / Consult your caregiver. 2 doses, 6 to 18 months apart.  Hepatitis B immunization.** / Consult your caregiver. 3 doses, usually over 6 months. Ages 58 and over  Blood pressure check.** / Every 1 to 2 years.  Lipid and cholesterol check.** / Every 5 years beginning at age 83.  Clinical breast exam.** / Every year after age 27.  Mammogram.** / Every year beginning at age 54 and continuing for as long as you are in good health. Consult with your caregiver.  Pap test.** / Every 3 years starting at age 2 through age 9 or 48 with a 3 consecutive normal Pap tests. Testing can be stopped between 65 and 70 with 3 consecutive normal Pap tests and no abnormal Pap or HPV tests in the past 10 years.  HPV screening.** / Every 3 years from ages 1 through ages 61 or 40 with a history of 3 consecutive normal Pap tests. Testing can be stopped between 65 and 70 with 3 consecutive normal Pap tests and no abnormal Pap or HPV tests in the past 10 years.  Fecal occult blood test (FOBT) of stool. / Every year beginning at age 18 and continuing until age 80. You may not need to do this test if you get a colonoscopy every 10 years.  Flexible sigmoidoscopy or colonoscopy.** / Every 5 years for a flexible sigmoidoscopy or every 10 years for a colonoscopy beginning at age 49 and continuing until age 88.  Hepatitis C blood test.** / For  all people born from [birth year] through 1964 and any individual with known risks for hepatitis C.  Osteoporosis screening.** / A one-time screening for women ages 47 and over and women at risk for fractures or osteoporosis.  Skin self-exam. / Monthly.  Influenza immunization.** / Every year.  Pneumococcal polysaccharide immunization.** / 1 dose at age 55 (or older) if you have never been  vaccinated.  Tetanus, diphtheria, pertussis (Tdap, Td) immunization. / A one-time dose of Tdap vaccine if you are over 65 and have contact with an infant, are a Research scientist (physical sciences), or simply want to be protected from whooping cough. After that, you need a Td booster dose every 10 years.  Varicella immunization.** / Consult your caregiver.  Meningococcal immunization.** / Consult your caregiver.  Hepatitis A immunization.** / Consult your caregiver. 2 doses, 6 to 18 months apart.  Hepatitis B immunization.** / Check with your caregiver. 3 doses, usually over 6 months. ** Family history and personal history of risk and conditions may change your caregiver's recommendations. Document Released: 10/06/2001 Document Revised: 11/02/2011 Document Reviewed: 01/05/2011 [facility] Patient Information 2013 [place], [place].     Neta Mends. Welby Montminy M.D.

## 2012-08-01 NOTE — Patient Instructions (Addendum)
Will notify you  of labs when available. Someone will contact you about a referral to gastroenterology regarding your heartburn constipation and need for colonoscopy. The anti-inflammatory she take for your arthritis can bother your stomach. Suggest she try Prilosec once a day for the next 2 weeks and see if this helps then as needed.  In regard to your constipation you can try MiraLax one capful a day until stools are soft enough and then as needed.  We'll do a Pap smear next year.  Constipation, Adult Constipation is when a person has fewer than 3 bowel movements a week; has difficulty having a bowel movement; or has stools that are dry, hard, or larger than normal. As people grow older, constipation is more common. If you try to fix constipation with medicines that make you have a bowel movement (laxatives), the problem may get worse. Long-term laxative use may cause the muscles of the colon to become weak. A low-fiber diet, not taking in enough fluids, and taking certain medicines may make constipation worse. CAUSES   Certain medicines, such as antidepressants, pain medicine, iron supplements, antacids, and water pills.   Certain diseases, such as diabetes, irritable bowel syndrome (IBS), thyroid disease, or depression.   Not drinking enough water.   Not eating enough fiber-rich foods.   Stress or travel.  Lack of physical activity or exercise.  Not going to the restroom when there is the urge to have a bowel movement.  Ignoring the urge to have a bowel movement.  Using laxatives too much. SYMPTOMS   Having fewer than 3 bowel movements a week.   Straining to have a bowel movement.   Having hard, dry, or larger than normal stools.   Feeling full or bloated.   Pain in the lower abdomen.  Not feeling relief after having a bowel movement. DIAGNOSIS  Your caregiver will take a medical history and perform a physical exam. Further testing may be done for severe  constipation. Some tests may include:   A barium enema X-ray to examine your rectum, colon, and sometimes, your small intestine.  A sigmoidoscopy to examine your lower colon.  A colonoscopy to examine your entire colon. TREATMENT  Treatment will depend on the severity of your constipation and what is causing it. Some dietary treatments include drinking more fluids and eating more fiber-rich foods. Lifestyle treatments may include regular exercise. If these diet and lifestyle recommendations do not help, your caregiver may recommend taking over-the-counter laxative medicines to help you have bowel movements. Prescription medicines may be prescribed if over-the-counter medicines do not work.  HOME CARE INSTRUCTIONS   Increase dietary fiber in your diet, such as fruits, vegetables, whole grains, and beans. Limit high-fat and processed sugars in your diet, such as [place] fries, hamburgers, cookies, candies, and soda.   A fiber supplement may be added to your diet if you cannot get enough fiber from foods.   Drink enough fluids to keep your urine clear or pale yellow.   Exercise regularly or as directed by your caregiver.   Go to the restroom when you have the urge to go. Do not hold it.  Only take medicines as directed by your caregiver. Do not take other medicines for constipation without talking to your caregiver first. SEEK IMMEDIATE MEDICAL CARE IF:   You have bright red blood in your stool.   Your constipation lasts for more than 4 days or gets worse.   You have abdominal or rectal pain.   You have  thin, pencil-like stools.  You have unexplained weight loss. MAKE SURE YOU:   Understand these instructions.  Will watch your condition.  Will get help right away if you are not doing well or get worse. Document Released: 05/08/2004 Document Revised: 11/02/2011 Document Reviewed: 07/14/2011 [facility] Patient Information 2013 [place], [place].  Preventive Care for Adults,  Female A healthy lifestyle and preventive care can promote health and wellness. Preventive health guidelines for women include the following key practices.  A routine yearly physical is a good way to check with your caregiver about your health and preventive screening. It is a chance to share any concerns and updates on your health, and to receive a thorough exam.  Visit your dentist for a routine exam and preventive care every 6 months. Brush your teeth twice a day and floss once a day. Good oral hygiene prevents tooth decay and gum disease.  The frequency of eye exams is based on your age, health, family medical history, use of contact lenses, and other factors. Follow your caregiver's recommendations for frequency of eye exams.  Eat a healthy diet. Foods like vegetables, fruits, whole grains, low-fat dairy products, and lean protein foods contain the nutrients you need without too many calories. Decrease your intake of foods high in solid fats, added sugars, and salt. Eat the right amount of calories for you.Get information about a proper diet from your caregiver, if necessary.  Regular physical exercise is one of the most important things you can do for your health. Most adults should get at least 150 minutes of moderate-intensity exercise (any activity that increases your heart rate and causes you to sweat) each week. In addition, most adults need muscle-strengthening exercises on 2 or more days a week.  Maintain a healthy weight. The body mass index (BMI) is a screening tool to identify possible weight problems. It provides an estimate of body fat based on height and weight. Your caregiver can help determine your BMI, and can help you achieve or maintain a healthy weight.For adults 20 years and older:  A BMI below 18.5 is considered underweight.  A BMI of 18.5 to 24.9 is normal.  A BMI of 25 to 29.9 is considered overweight.  A BMI of 30 and above is considered obese.  Maintain normal  blood lipids and cholesterol levels by exercising and minimizing your intake of saturated fat. Eat a balanced diet with plenty of fruit and vegetables. Blood tests for lipids and cholesterol should begin at age 1 and be repeated every 5 years. If your lipid or cholesterol levels are high, you are over 50, or you are at high risk for heart disease, you may need your cholesterol levels checked more frequently.Ongoing high lipid and cholesterol levels should be treated with medicines if diet and exercise are not effective.  If you smoke, find out from your caregiver how to quit. If you do not use tobacco, do not start.  If you are pregnant, do not drink alcohol. If you are breastfeeding, be very cautious about drinking alcohol. If you are not pregnant and choose to drink alcohol, do not exceed 1 drink per day. One drink is considered to be 12 ounces (355 mL) of beer, 5 ounces (148 mL) of wine, or 1.5 ounces (44 mL) of liquor.  Avoid use of street drugs. Do not share needles with anyone. Ask for help if you need support or instructions about stopping the use of drugs.  High blood pressure causes heart disease and increases the risk  of stroke. Your blood pressure should be checked at least every 1 to 2 years. Ongoing high blood pressure should be treated with medicines if weight loss and exercise are not effective.  If you are 23 to 65 years old, ask your caregiver if you should take aspirin to prevent strokes.  Diabetes screening involves taking a blood sample to check your fasting blood sugar level. This should be done once every 3 years, after age 6, if you are within normal weight and without risk factors for diabetes. Testing should be considered at a younger age or be carried out more frequently if you are overweight and have at least 1 risk factor for diabetes.  Breast cancer screening is essential preventive care for women. You should practice "breast self-awareness." This means understanding the  normal appearance and feel of your breasts and may include breast self-examination. Any changes detected, no matter how small, should be reported to a caregiver. Women in their 49s and 30s should have a clinical breast exam (CBE) by a caregiver as part of a regular health exam every 1 to 3 years. After age 29, women should have a CBE every year. Starting at age 68, women should consider having a mammography (breast X-ray test) every year. Women who have a family history of breast cancer should talk to their caregiver about genetic screening. Women at a high risk of breast cancer should talk to their caregivers about having magnetic resonance imaging (MRI) and a mammography every year.  The Pap test is a screening test for cervical cancer. A Pap test can show cell changes on the cervix that might become cervical cancer if left untreated. A Pap test is a procedure in which cells are obtained and examined from the lower end of the uterus (cervix).  Women should have a Pap test starting at age 44.  Between ages 46 and 63, Pap tests should be repeated every 2 years.  Beginning at age 44, you should have a Pap test every 3 years as long as the past 3 Pap tests have been normal.  Some women have medical problems that increase the chance of getting cervical cancer. Talk to your caregiver about these problems. It is especially important to talk to your caregiver if a new problem develops soon after your last Pap test. In these cases, your caregiver may recommend more frequent screening and Pap tests.  The above recommendations are the same for women who have or have not gotten the vaccine for human papillomavirus (HPV).  If you had a hysterectomy for a problem that was not cancer or a condition that could lead to cancer, then you no longer need Pap tests. Even if you no longer need a Pap test, a regular exam is a good idea to make sure no other problems are starting.  If you are between ages 73 and 84, and  you have had normal Pap tests going back 10 years, you no longer need Pap tests. Even if you no longer need a Pap test, a regular exam is a good idea to make sure no other problems are starting.  If you have had past treatment for cervical cancer or a condition that could lead to cancer, you need Pap tests and screening for cancer for at least 20 years after your treatment.  If Pap tests have been discontinued, risk factors (such as a new sexual partner) need to be reassessed to determine if screening should be resumed.  The HPV test is an  additional test that may be used for cervical cancer screening. The HPV test looks for the virus that can cause the cell changes on the cervix. The cells collected during the Pap test can be tested for HPV. The HPV test could be used to screen women aged 26 years and older, and should be used in women of any age who have unclear Pap test results. After the age of 100, women should have HPV testing at the same frequency as a Pap test.  Colorectal cancer can be detected and often prevented. Most routine colorectal cancer screening begins at the age of 63 and continues through age 71. However, your caregiver may recommend screening at an earlier age if you have risk factors for colon cancer. On a yearly basis, your caregiver may provide home test kits to check for hidden blood in the stool. Use of a small camera at the end of a tube, to directly examine the colon (sigmoidoscopy or colonoscopy), can detect the earliest forms of colorectal cancer. Talk to your caregiver about this at age 30, when routine screening begins. Direct examination of the colon should be repeated every 5 to 10 years through age 50, unless early forms of pre-cancerous polyps or small growths are found.  Hepatitis C blood testing is recommended for all people born from [birth year] through 1964 and any individual with known risks for hepatitis C.  Practice safe sex. Use condoms and avoid high-risk sexual  practices to reduce the spread of sexually transmitted infections (STIs). STIs include gonorrhea, chlamydia, syphilis, trichomonas, herpes, HPV, and human immunodeficiency virus (HIV). Herpes, HIV, and HPV are viral illnesses that have no cure. They can result in disability, cancer, and death. Sexually active women aged 66 and younger should be checked for chlamydia. Older women with new or multiple partners should also be tested for chlamydia. Testing for other STIs is recommended if you are sexually active and at increased risk.  Osteoporosis is a disease in which the bones lose minerals and strength with aging. This can result in serious bone fractures. The risk of osteoporosis can be identified using a bone density scan. Women ages 64 and over and women at risk for fractures or osteoporosis should discuss screening with their caregivers. Ask your caregiver whether you should take a calcium supplement or vitamin D to reduce the rate of osteoporosis.  Menopause can be associated with physical symptoms and risks. Hormone replacement therapy is available to decrease symptoms and risks. You should talk to your caregiver about whether hormone replacement therapy is right for you.  Use sunscreen with sun protection factor (SPF) of 30 or more. Apply sunscreen liberally and repeatedly throughout the day. You should seek shade when your shadow is shorter than you. Protect yourself by wearing long sleeves, pants, a wide-brimmed hat, and sunglasses year round, whenever you are outdoors.  Once a month, do a whole body skin exam, using a mirror to look at the skin on your back. Notify your caregiver of new moles, moles that have irregular borders, moles that are larger than a pencil eraser, or moles that have changed in shape or color.  Stay current with required immunizations.  Influenza. You need a dose every fall (or winter). The composition of the flu vaccine changes each year, so being vaccinated once is not  enough.  Pneumococcal polysaccharide. You need 1 to 2 doses if you smoke cigarettes or if you have certain chronic medical conditions. You need 1 dose at age 7 (or older) if  you have never been vaccinated.  Tetanus, diphtheria, pertussis (Tdap, Td). Get 1 dose of Tdap vaccine if you are younger than age 47, are over 37 and have contact with an infant, are a Research scientist (physical sciences), are pregnant, or simply want to be protected from whooping cough. After that, you need a Td booster dose every 10 years. Consult your caregiver if you have not had at least 3 tetanus and diphtheria-containing shots sometime in your life or have a deep or dirty wound.  HPV. You need this vaccine if you are a woman age 23 or younger. The vaccine is given in 3 doses over 6 months.  Measles, mumps, rubella (MMR). You need at least 1 dose of MMR if you were born in 1957 or later. You may also need a second dose.  Meningococcal. If you are age 53 to 77 and a first-year college student living in a residence hall, or have one of several medical conditions, you need to get vaccinated against meningococcal disease. You may also need additional booster doses.  Zoster (shingles). If you are age 38 or older, you should get this vaccine.  Varicella (chickenpox). If you have never had chickenpox or you were vaccinated but received only 1 dose, talk to your caregiver to find out if you need this vaccine.  Hepatitis A. You need this vaccine if you have a specific risk factor for hepatitis A virus infection or you simply wish to be protected from this disease. The vaccine is usually given as 2 doses, 6 to 18 months apart.  Hepatitis B. You need this vaccine if you have a specific risk factor for hepatitis B virus infection or you simply wish to be protected from this disease. The vaccine is given in 3 doses, usually over 6 months. Preventive Services / Frequency Ages 100 to 72  Blood pressure check.** / Every 1 to 2 years.  Lipid and  cholesterol check.** / Every 5 years beginning at age 81.  Clinical breast exam.** / Every 3 years for women in their 35s and 30s.  Pap test.** / Every 2 years from ages 54 through 57. Every 3 years starting at age 93 through age 86 or 44 with a history of 3 consecutive normal Pap tests.  HPV screening.** / Every 3 years from ages 18 through ages 22 to 59 with a history of 3 consecutive normal Pap tests.  Hepatitis C blood test.** / For any individual with known risks for hepatitis C.  Skin self-exam. / Monthly.  Influenza immunization.** / Every year.  Pneumococcal polysaccharide immunization.** / 1 to 2 doses if you smoke cigarettes or if you have certain chronic medical conditions.  Tetanus, diphtheria, pertussis (Tdap, Td) immunization. / A one-time dose of Tdap vaccine. After that, you need a Td booster dose every 10 years.  HPV immunization. / 3 doses over 6 months, if you are 57 and younger.  Measles, mumps, rubella (MMR) immunization. / You need at least 1 dose of MMR if you were born in 1957 or later. You may also need a second dose.  Meningococcal immunization. / 1 dose if you are age 49 to 8 and a first-year college student living in a residence hall, or have one of several medical conditions, you need to get vaccinated against meningococcal disease. You may also need additional booster doses.  Varicella immunization.** / Consult your caregiver.  Hepatitis A immunization.** / Consult your caregiver. 2 doses, 6 to 18 months apart.  Hepatitis B immunization.** / Consult  your caregiver. 3 doses usually over 6 months. Ages 12 to 55  Blood pressure check.** / Every 1 to 2 years.  Lipid and cholesterol check.** / Every 5 years beginning at age 44.  Clinical breast exam.** / Every year after age 93.  Mammogram.** / Every year beginning at age 60 and continuing for as long as you are in good health. Consult with your caregiver.  Pap test.** / Every 3 years starting at age  89 through age 62 or 81 with a history of 3 consecutive normal Pap tests.  HPV screening.** / Every 3 years from ages 19 through ages 71 to 19 with a history of 3 consecutive normal Pap tests.  Fecal occult blood test (FOBT) of stool. / Every year beginning at age 29 and continuing until age 49. You may not need to do this test if you get a colonoscopy every 10 years.  Flexible sigmoidoscopy or colonoscopy.** / Every 5 years for a flexible sigmoidoscopy or every 10 years for a colonoscopy beginning at age 66 and continuing until age 60.  Hepatitis C blood test.** / For all people born from [birth year] through 1964 and any individual with known risks for hepatitis C.  Skin self-exam. / Monthly.  Influenza immunization.** / Every year.  Pneumococcal polysaccharide immunization.** / 1 to 2 doses if you smoke cigarettes or if you have certain chronic medical conditions.  Tetanus, diphtheria, pertussis (Tdap, Td) immunization.** / A one-time dose of Tdap vaccine. After that, you need a Td booster dose every 10 years.  Measles, mumps, rubella (MMR) immunization. / You need at least 1 dose of MMR if you were born in 1957 or later. You may also need a second dose.  Varicella immunization.** / Consult your caregiver.  Meningococcal immunization.** / Consult your caregiver.  Hepatitis A immunization.** / Consult your caregiver. 2 doses, 6 to 18 months apart.  Hepatitis B immunization.** / Consult your caregiver. 3 doses, usually over 6 months. Ages 77 and over  Blood pressure check.** / Every 1 to 2 years.  Lipid and cholesterol check.** / Every 5 years beginning at age 45.  Clinical breast exam.** / Every year after age 32.  Mammogram.** / Every year beginning at age 77 and continuing for as long as you are in good health. Consult with your caregiver.  Pap test.** / Every 3 years starting at age 92 through age 9 or 53 with a 3 consecutive normal Pap tests. Testing can be stopped between 65 and  70 with 3 consecutive normal Pap tests and no abnormal Pap or HPV tests in the past 10 years.  HPV screening.** / Every 3 years from ages 55 through ages 71 or 36 with a history of 3 consecutive normal Pap tests. Testing can be stopped between 65 and 70 with 3 consecutive normal Pap tests and no abnormal Pap or HPV tests in the past 10 years.  Fecal occult blood test (FOBT) of stool. / Every year beginning at age 74 and continuing until age 63. You may not need to do this test if you get a colonoscopy every 10 years.  Flexible sigmoidoscopy or colonoscopy.** / Every 5 years for a flexible sigmoidoscopy or every 10 years for a colonoscopy beginning at age 34 and continuing until age 41.  Hepatitis C blood test.** / For all people born from [birth year] through 1964 and any individual with known risks for hepatitis C.  Osteoporosis screening.** / A one-time screening for women ages 44 and over and  women at risk for fractures or osteoporosis.  Skin self-exam. / Monthly.  Influenza immunization.** / Every year.  Pneumococcal polysaccharide immunization.** / 1 dose at age 57 (or older) if you have never been vaccinated.  Tetanus, diphtheria, pertussis (Tdap, Td) immunization. / A one-time dose of Tdap vaccine if you are over 65 and have contact with an infant, are a Research scientist (physical sciences), or simply want to be protected from whooping cough. After that, you need a Td booster dose every 10 years.  Varicella immunization.** / Consult your caregiver.  Meningococcal immunization.** / Consult your caregiver.  Hepatitis A immunization.** / Consult your caregiver. 2 doses, 6 to 18 months apart.  Hepatitis B immunization.** / Check with your caregiver. 3 doses, usually over 6 months. ** Family history and personal history of risk and conditions may change your caregiver's recommendations. Document Released: 10/06/2001 Document Revised: 11/02/2011 Document Reviewed: 01/05/2011 [facility] Patient Information 2013  [place], [place].

## 2012-08-08 ENCOUNTER — Other Ambulatory Visit: Payer: Self-pay | Admitting: Internal Medicine

## 2012-08-08 ENCOUNTER — Encounter: Payer: Self-pay | Admitting: Internal Medicine

## 2012-08-10 ENCOUNTER — Other Ambulatory Visit: Payer: Self-pay | Admitting: Internal Medicine

## 2012-08-12 MED ORDER — AMOXICILLIN 500 MG PO CAPS: ORAL_CAPSULE | ORAL | Status: DC

## 2012-08-22 ENCOUNTER — Encounter: Payer: Self-pay | Admitting: Internal Medicine

## 2012-09-05 ENCOUNTER — Ambulatory Visit (AMBULATORY_SURGERY_CENTER): Payer: Medicare Other | Admitting: *Deleted

## 2012-09-05 VITALS — Ht 62.0 in | Wt 120.0 lb

## 2012-09-05 DIAGNOSIS — Z1211 Encounter for screening for malignant neoplasm of colon: Secondary | ICD-10-CM

## 2012-09-05 MED ORDER — SUPREP BOWEL PREP KIT 17.5-3.13-1.6 GM/177ML PO SOLN: ORAL | Status: DC

## 2012-09-06 ENCOUNTER — Telehealth: Payer: Self-pay | Admitting: Family Medicine

## 2012-09-06 NOTE — Telephone Encounter (Signed)
If no fever ;then can try macrobid 100 1 po bid for 7 days .  If still a problem then  ROV to reassess.

## 2012-09-06 NOTE — Telephone Encounter (Signed)
Patient called and left a message on my machine.  I called her and spoke to her.  She continues to have a UTI.  Her urine is cloudy, she continues to burn when she urinates.  She is asking for an antibiotic

## 2012-09-07 NOTE — Telephone Encounter (Signed)
Tried again to contact the patient.  Left a message on her mobile number.

## 2012-09-07 NOTE — Telephone Encounter (Signed)
Left message on cell for the pt to return my call. 

## 2012-09-08 NOTE — Telephone Encounter (Signed)
Left message on cell for her to return my call.

## 2012-09-09 ENCOUNTER — Other Ambulatory Visit: Payer: Self-pay | Admitting: Family Medicine

## 2012-09-09 MED ORDER — NITROFURANTOIN MONOHYD MACRO 100 MG PO CAPS: 100.0000 mg | ORAL_CAPSULE | Freq: Two times a day (BID) | ORAL | Status: DC

## 2012-09-09 NOTE — Telephone Encounter (Signed)
Tried reaching the patient.  Left message on her cell phone informing her that I was sending in a new rx to the pharmacy.  Instructed her to call back for follow up if still symptomatic after she completed the medication.  Instructed her to call and leave a message on whether or not she has had a fever.

## 2012-09-26 ENCOUNTER — Telehealth: Payer: Self-pay | Admitting: Gastroenterology

## 2012-09-26 MED ORDER — PEG-KCL-NACL-NASULF-NA ASC-C 100 G PO SOLR: 1.0000 | ORAL | Status: DC

## 2012-09-26 NOTE — Telephone Encounter (Signed)
She vomited her first dose of suprep.  I called in moviprep, she knows to drink it slowly but completely in divided doses.

## 2012-09-27 ENCOUNTER — Ambulatory Visit (AMBULATORY_SURGERY_CENTER): Payer: Medicare Other | Admitting: Internal Medicine

## 2012-09-27 ENCOUNTER — Encounter: Payer: Self-pay | Admitting: Internal Medicine

## 2012-09-27 VITALS — BP 139/89 | HR 73 | Resp 14 | Ht 62.0 in | Wt 120.0 lb

## 2012-09-27 DIAGNOSIS — K5730 Diverticulosis of large intestine without perforation or abscess without bleeding: Secondary | ICD-10-CM

## 2012-09-27 DIAGNOSIS — Z1211 Encounter for screening for malignant neoplasm of colon: Secondary | ICD-10-CM

## 2012-09-27 MED ORDER — SODIUM CHLORIDE 0.9 % IV SOLN: 500.0000 mL | INTRAVENOUS | Status: DC

## 2012-09-27 NOTE — Progress Notes (Signed)
Once the pt woke up a little, she c/o "something in my left eye".  I looked at her left which was red.  Pt requested a damp cloth so she could wipe her left eye.  I gave her a dampened 2x2.  She wiped her eye herself.  Dr. Leone Payor in to speak with the pt and her husband.  He was notified of her left eye.  Dr. Leone Payor examined her left eye and look at it with a flash light, he felt it was okay and advised her not to wipe it figerously.[email or web address]

## 2012-09-27 NOTE — Progress Notes (Signed)
Patient stating her eye feels much improved. Patient keeping eye open and encouraged not to rub her eye. Lights do not bother the patient.

## 2012-09-27 NOTE — Op Note (Signed)
[facility] [address] [place], [address]   COLONOSCOPY PROCEDURE REPORT  PATIENT: Katee, Wentland  MR#: [id number] BIRTHDATE: [birth year] , 66  yrs. old GENDER: Female ENDOSCOPIST: Iva Boop, MD, [facility] REFERRED XB:JYNWG Lonie Peak, M.D. PROCEDURE DATE:  09/27/2012 PROCEDURE:   Colonoscopy, diagnostic ASA CLASS:   Class II INDICATIONS:average risk screening. MEDICATIONS: propofol (Diprivan) 300mg  IV, MAC sedation, administered by CRNA, and These medications were titrated to patient response per physician's verbal order  DESCRIPTION OF PROCEDURE:   After the risks benefits and alternatives of the procedure were thoroughly explained, informed consent was obtained.  A digital rectal exam revealed no abnormalities of the rectum.   The LB [id number]  endoscope was introduced through the anus and advanced to the cecum, which was identified by both the appendix and ileocecal valve. No adverse events experienced.   The quality of the prep was Suprep excellent The instrument was then slowly withdrawn as the colon was fully examined.      COLON FINDINGS: There was moderate diverticulosis noted in the sigmoid colon with associated muscular hypertrophy.   The colon mucosa was otherwise normal.   A right colon retroflexion was performed.  Retroflexed views revealed no abnormalities. The time to cecum=10 minutes 55 seconds.  Withdrawal time=10 minutes 25 seconds.  The scope was withdrawn and the procedure completed. COMPLICATIONS: There were no complications.  ENDOSCOPIC IMPRESSION: 1.   There was moderate diverticulosis noted in the sigmoid colon 2.   The colon mucosa was otherwise normal - excellent prep  RECOMMENDATIONS: Repeat Colonscopy in 10 years.   eSigned:  Iva Boop, MD, [facility] 09/27/2012 12:08 PM   cc: Madelin Headings, MD and The Patient

## 2012-09-27 NOTE — Patient Instructions (Addendum)
The colonoscopy showed diverticulosis but no polyps or cancer. Excellent prep!  Next routine colonoscopy in 10 years - 2024. Will try to improve the prep by then.  Thank you for choosing me and [facility].  Iva Boop, MD, [facility]     Handouts was given to your care partner on diverticulosis and a high fiber diet.  You may resume your current medications today.  Please call if any questions or concerns.    YOU HAD AN ENDOSCOPIC PROCEDURE TODAY AT THE [facility]: Refer to the procedure report that was given to you for any specific questions about what was found during the examination.  If the procedure report does not answer your questions, please call your gastroenterologist to clarify.  If you requested that your care partner not be given the details of your procedure findings, then the procedure report has been included in a sealed envelope for you to review at your convenience later.  YOU SHOULD EXPECT: Some feelings of bloating in the abdomen. Passage of more gas than usual.  Walking can help get rid of the air that was put into your GI tract during the procedure and reduce the bloating. If you had a lower endoscopy (such as a colonoscopy or flexible sigmoidoscopy) you may notice spotting of blood in your stool or on the toilet paper. If you underwent a bowel prep for your procedure, then you may not have a normal bowel movement for a few days.  DIET: Your first meal following the procedure should be a light meal and then it is ok to progress to your normal diet.  A half-sandwich or bowl of soup is an example of a good first meal.  Heavy or fried foods are harder to digest and may make you feel nauseous or bloated.  Likewise meals heavy in dairy and vegetables can cause extra gas to form and this can also increase the bloating.  Drink plenty of fluids but you should avoid alcoholic beverages for 24 hours.  ACTIVITY: Your care partner should take you home  directly after the procedure.  You should plan to take it easy, moving slowly for the rest of the day.  You can resume normal activity the day after the procedure however you should NOT DRIVE or use heavy machinery for 24 hours (because of the sedation medicines used during the test).    SYMPTOMS TO REPORT IMMEDIATELY: A gastroenterologist can be reached at any hour.  During normal business hours, 8:30 AM to 5:00 PM Monday through Friday, call [phone].  After hours and on weekends, please call the GI answering service at [phone] who will take a message and have the physician on call contact you.   Following lower endoscopy (colonoscopy or flexible sigmoidoscopy):  Excessive amounts of blood in the stool  Significant tenderness or worsening of abdominal pains  Swelling of the abdomen that is new, acute  Fever of 100F or higher  Following upper endoscopy (EGD)  Vomiting of blood or coffee ground material  New chest pain or pain under the shoulder blades  Painful or persistently difficult swallowing  New shortness of breath  Fever of 100F or higher  Black, tarry-looking stools  FOLLOW UP: If any biopsies were taken you will be contacted by phone or by letter within the next 1-3 weeks.  Call your gastroenterologist if you have not heard about the biopsies in 3 weeks.  Our staff will call the home number listed on your records the next  business day following your procedure to check on you and address any questions or concerns that you may have at that time regarding the information given to you following your procedure. This is a courtesy call and so if there is no answer at the home number and we have not heard from you through the emergency physician on call, we will assume that you have returned to your regular daily activities without incident.  SIGNATURES/CONFIDENTIALITY: You and/or your care partner have signed paperwork which will be entered into your electronic medical  record.  These signatures attest to the fact that that the information above on your After Visit Summary has been reviewed and is understood.  Full responsibility of the confidentiality of this discharge information lies with you and/or your care-partner.

## 2012-09-28 ENCOUNTER — Telehealth: Payer: Self-pay | Admitting: *Deleted

## 2012-09-28 NOTE — Telephone Encounter (Signed)
Left message that we called for f/u 

## 2012-11-17 ENCOUNTER — Other Ambulatory Visit: Payer: Self-pay | Admitting: Internal Medicine

## 2012-12-21 ENCOUNTER — Other Ambulatory Visit: Payer: Self-pay | Admitting: Internal Medicine

## 2012-12-23 NOTE — Telephone Encounter (Signed)
Pharm called to follow up on request. amitriptyline (ELAVIL) 100 MG tablet

## 2013-01-10 ENCOUNTER — Ambulatory Visit (INDEPENDENT_AMBULATORY_CARE_PROVIDER_SITE_OTHER)
Admission: RE | Admit: 2013-01-10 | Discharge: 2013-01-10 | Disposition: A | Discharge status: Home / Self Care | Payer: Medicare Other | Source: Ambulatory Visit | Attending: Internal Medicine | Admitting: Internal Medicine

## 2013-01-10 ENCOUNTER — Ambulatory Visit (INDEPENDENT_AMBULATORY_CARE_PROVIDER_SITE_OTHER): Payer: Medicare Other | Admitting: Internal Medicine

## 2013-01-10 ENCOUNTER — Encounter: Payer: Self-pay | Admitting: Internal Medicine

## 2013-01-10 VITALS — BP 128/84 | HR 91 | Temp 98.5°F | Wt 117.0 lb

## 2013-01-10 DIAGNOSIS — IMO0002 Reserved for concepts with insufficient information to code with codable children: Secondary | ICD-10-CM

## 2013-01-10 DIAGNOSIS — M5410 Radiculopathy, site unspecified: Secondary | ICD-10-CM

## 2013-01-10 MED ORDER — PREDNISONE 10 MG PO TABS: ORAL_TABLET | ORAL | Status: DC

## 2013-01-10 NOTE — Progress Notes (Signed)
Chief Complaint  Patient presents with  . Leg Pain    Whole left leg.    HPI:   Patient comes in today for acute SDA visit for problem has been going on for about 1-1/2 months. She describes this as her left leg hurting is progressing so now it hurts all the time. It radiates down her leg mostly medially possibly to the foot it is worse when she coughs or sneezes has a minor pain in her lower back but nothing significant no major trauma weakness she does have to bend over for her job. She's not had this problem before.  It also bothers her when she stretches she is uncomfortable in most positions.   Progressing and hurts all the time .  ROS: See pertinent positives and negatives per HPI.  Has GI upset when she takes anti-inflammatories. No changes in bowel or bladder. No fever or unusual rashes  Past Medical History  Diagnosis Date  . Hypothyroidism   . Osteoarthritis   . Rosacea   . GERD (gastroesophageal reflux disease)     Family History  Problem Relation Age of Onset  . Heart disease    . Pancreatic cancer Brother     History   Social History  . Marital Status: Married    Spouse Name: N/A    Number of Children: N/A  . Years of Education: N/A   Social History Main Topics  . Smoking status: Former Games developer  . Smokeless tobacco: Never Used  . Alcohol Use: No  . Drug Use: No  . Sexually Active: None   Other Topics Concern  . 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 [place] [employer]   [facility] OF 2son getting a divorce    Married.    No health insurance self employed  Now on medicare    2 dogs     Outpatient Encounter Prescriptions as of 01/10/2013  Medication Sig Dispense Refill  . amitriptyline (ELAVIL) 100 MG tablet TAKE 3 TABLETS BY MOUTH EVERY DAY  90 tablet  0  . Ascorbic Acid (VITAMIN C WITH ROSE HIPS) 1000 MG tablet Take 1,000 mg by mouth daily.      . Biotin 5000 MCG TABS Take 1 tablet  by mouth daily.      . Cholecalciferol (VITAMIN D) 1000 UNITS capsule Take 1,000 Units by mouth daily.      . diphenhydramine-acetaminophen (TYLENOL PM) 25-500 MG TABS Take 1 tablet by mouth at bedtime as needed.      . etodolac (LODINE) 500 MG tablet TAKE 1 TABLET BY MOUTH TWICE A DAY  60 tablet  2  . ibuprofen (ADVIL,MOTRIN) 200 MG tablet Take 600 mg by mouth every 6 (six) hours as needed.      . Multiple Vitamins-Minerals (MULTIVITAMIN WITH MINERALS) tablet Take 1 tablet by mouth daily.      Marland Kitchen omeprazole (PRILOSEC) 20 MG capsule Take 20 mg by mouth daily.      . polyethylene glycol (MIRALAX / GLYCOLAX) packet Take 17 g by mouth daily.      Marland Kitchen SYNTHROID 75 MCG tablet TAKE 1 TABLET BY MOUTH EVERY DAY  30 tablet  5  . vitamin E (VITAMIN E) 400 UNIT capsule Take 400 Units by mouth daily.      . predniSONE (DELTASONE) 10 MG tablet Take pills per day,6,6,6,4,4,4,2,2,2,1,1,1  40 tablet  0  . [DISCONTINUED]  amitriptyline (ELAVIL) 100 MG tablet       . [DISCONTINUED] nitrofurantoin, macrocrystal-monohydrate, (MACROBID) 100 MG capsule Take 1 capsule (100 mg total) by mouth 2 (two) times daily.  14 capsule  0   No facility-administered encounter medications on file as of 01/10/2013.    EXAM:  BP 128/84  Pulse 91  Temp(Src) 98.5 F (36.9 C) (Oral)  Wt 117 lb (53.071 kg)  BMI 21.39 kg/m2  SpO2 98%  Body mass index is 21.39 kg/(m^2).  GENERAL: vitals reviewed and listed above, alert, oriented, appears well hydrated and in no acute distress she has discomfort when moving around. HEENT: atraumatic, conjunctiva  clear, no obvious abnormalities on inspection of external nose and ears  NECK: no obvious masses on inspection palpation   Abdomen soft without organomegaly guarding or rebound CV: HRRR, no clubbing cyanosis or  peripheral edema nl cap refill  Back no point tenderness. Legs without atrophy pulses present. DTRs appear present in the lower extremities positive SLR on the left supine and  sitting position. She can do toe heel walk with some pain but limping. There is no clonus. MS: moves all extremities without noticeable focal  Abnormality no acute joint swelling of the knee and no groin pain or excess pain on hip rotation. PSYCH: pleasant and cooperative, no obvious depression or anxiety  ASSESSMENT AND PLAN:  Discussed the following assessment and plan:  Radicular leg pain - Plan: DG Lumbar Spine Complete  Set leg pain that sounds radicular in nature portably I don't see motor deficits but this has persisted for 6 weeks and more. 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  She may respond to this and need physical therapy MRI may be appropriate if persists. Discussed alarm features. 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  Patient Instructions    I suspect this is radicular pain.  ie a pinched nerve in the back.     Plan  Consult with orthopedics and  Plan steroid  Oral to see if it calms down    Get plain back x ray in the meantime.      Radicular Pain Radicular pain in either the arm or leg is usually from a bulging or herniated disk in the spine. A piece of the herniated disk may press against the nerves as the nerves exit the spine. This causes pain which is felt at the tips of the nerves down the arm or leg. Other causes of radicular pain may include:  Fractures.  Heart disease.  Cancer.  An abnormal and usually degenerative state of the nervous system or nerves (neuropathy). Diagnosis may require CT or MRI scanning to determine the primary cause.  Nerves that start at the neck (nerve roots) may cause radicular pain in the outer shoulder and arm. It can spread down to the thumb and fingers. The symptoms vary depending on  which nerve root has been affected. In most cases radicular pain improves with conservative treatment. Neck problems may require physical therapy, a neck collar, or cervical traction. Treatment may take many weeks, and surgery may be considered if the symptoms do not improve.  Conservative treatment is also recommended for sciatica. Sciatica causes pain to radiate from the lower back or buttock area down the leg into the  foot. Often there is a history of back problems. Most patients with sciatica are better after 2 to 4 weeks of rest and other supportive care. Short term bed rest can reduce the disk pressure considerably. Sitting, however, is not a good position since this increases the pressure on the disk. You should avoid bending, lifting, and all other activities which make the problem worse. Traction can be used in severe cases. Surgery is usually reserved for patients who do not improve within the first months of treatment. Only take over-the-counter or prescription medicines for pain, discomfort, or fever as directed by your caregiver. Narcotics and muscle relaxants may help by relieving more severe pain and spasm and by providing mild sedation. Cold or massage can give significant relief. Spinal manipulation is not recommended. It can increase the degree of disc protrusion. Epidural steroid injections are often effective treatment for radicular pain. These injections deliver medicine to the spinal nerve in the space between the protective covering of the spinal cord and back bones (vertebrae). Your caregiver can give you more information about steroid injections. These injections are most effective when given within two weeks of the onset of pain.  You should see your caregiver for follow up care as recommended. A program for neck and back injury rehabilitation with stretching and strengthening exercises is an important part of management.  SEEK IMMEDIATE MEDICAL CARE IF:  You develop increased pain,  weakness, or numbness in your arm or leg.  You develop difficulty with bladder or bowel control.  You develop abdominal pain. Document Released: 09/17/2004 Document Revised: 11/02/2011 Document Reviewed: 12/03/2008 [facility] Patient Information 2013 [place], [place].      Neta Mends. [name] M.D.    Patient comes in today for SDA for  new problem evaluation.

## 2013-01-10 NOTE — Patient Instructions (Addendum)
   I suspect this is radicular pain.  ie a pinched nerve in the back.     Plan  Consult with orthopedics and  Plan steroid  Oral to see if it calms down    Get plain back x ray in the meantime.      Radicular Pain Radicular pain in either the arm or leg is usually from a bulging or herniated disk in the spine. A piece of the herniated disk may press against the nerves as the nerves exit the spine. This causes pain which is felt at the tips of the nerves down the arm or leg. Other causes of radicular pain may include:  Fractures.  Heart disease.  Cancer.  An abnormal and usually degenerative state of the nervous system or nerves (neuropathy). Diagnosis may require CT or MRI scanning to determine the primary cause.  Nerves that start at the neck (nerve roots) may cause radicular pain in the outer shoulder and arm. It can spread down to the thumb and fingers. The symptoms vary depending on which nerve root has been affected. In most cases radicular pain improves with conservative treatment. Neck problems may require physical therapy, a neck collar, or cervical traction. Treatment may take many weeks, and surgery may be considered if the symptoms do not improve.  Conservative treatment is also recommended for sciatica. Sciatica causes pain to radiate from the lower back or buttock area down the leg into the foot. Often there is a history of back problems. Most patients with sciatica are better after 2 to 4 weeks of rest and other supportive care. Short term bed rest can reduce the disk pressure considerably. Sitting, however, is not a good position since this increases the pressure on the disk. You should avoid bending, lifting, and all other activities which make the problem worse. Traction can be used in severe cases. Surgery is usually reserved for patients who do not improve within the first months of treatment. Only take over-the-counter or prescription medicines for pain, discomfort, or  fever as directed by your caregiver. Narcotics and muscle relaxants may help by relieving more severe pain and spasm and by providing mild sedation. Cold or massage can give significant relief. Spinal manipulation is not recommended. It can increase the degree of disc protrusion. Epidural steroid injections are often effective treatment for radicular pain. These injections deliver medicine to the spinal nerve in the space between the protective covering of the spinal cord and back bones (vertebrae). Your caregiver can give you more information about steroid injections. These injections are most effective when given within two weeks of the onset of pain.  You should see your caregiver for follow up care as recommended. A program for neck and back injury rehabilitation with stretching and strengthening exercises is an important part of management.  SEEK IMMEDIATE MEDICAL CARE IF:  You develop increased pain, weakness, or numbness in your arm or leg.  You develop difficulty with bladder or bowel control.  You develop abdominal pain. Document Released: 09/17/2004 Document Revised: 11/02/2011 Document Reviewed: 12/03/2008 [facility] Patient Information 2013 [place], [place].

## 2013-01-11 ENCOUNTER — Telehealth: Payer: Self-pay | Admitting: Family Medicine

## 2013-01-11 NOTE — Telephone Encounter (Signed)
Patient left a message on my voicemail that she cannot take the prednisone.  She has sweats throughout the night and woke with a red and blotchy face.  She would like a new rx or other advise.  Thanks!!

## 2013-01-12 NOTE — Telephone Encounter (Signed)
Try taking  Decrease dose  40 mg   Before giving up on this  Plan .  See if can move up her  Referral appt to more  Urgent   Thanks

## 2013-01-13 NOTE — Telephone Encounter (Signed)
Spoke to the pt.  She informed me that she believed she was sick when she called about this.  Has not had any further problems and is continuing with the regularly prescribed dose.  She will call back if any further problems.

## 2013-01-13 NOTE — Telephone Encounter (Signed)
Left message for pt to call back  °

## 2013-01-22 ENCOUNTER — Other Ambulatory Visit: Payer: Self-pay | Admitting: Internal Medicine

## 2013-01-23 ENCOUNTER — Telehealth: Payer: Self-pay

## 2013-01-23 NOTE — Telephone Encounter (Signed)
VM:  Pt called and states she wants to know what exactly is done with the x-rays and wanted to make sure they could be sent to the doctor she is going to see on Saturday.  Pt is requesting refills of amitriptyline. Pls advise.

## 2013-01-24 ENCOUNTER — Other Ambulatory Visit: Payer: Self-pay | Admitting: Family Medicine

## 2013-01-24 MED ORDER — AMITRIPTYLINE HCL 100 MG PO TABS: ORAL_TABLET | ORAL | Status: DC

## 2013-01-24 NOTE — Telephone Encounter (Signed)
Sent to [facility].  Pt notified.  She picked up x-rays from [place] office

## 2013-01-24 NOTE — Telephone Encounter (Signed)
Left message on voicemail at the number provided.

## 2013-02-17 ENCOUNTER — Other Ambulatory Visit: Payer: Self-pay | Admitting: Internal Medicine

## 2013-02-20 ENCOUNTER — Other Ambulatory Visit: Payer: Self-pay | Admitting: Internal Medicine

## 2013-02-20 MED ORDER — SYNTHROID 75 MCG PO TABS: ORAL_TABLET | ORAL | Status: DC

## 2013-03-12 NOTE — H&P (Signed)
[name] DOB: 1945-10-25  Chief complaint: back pain  History of Present Illness The patient is a 67 year old female who presents with back pain. The patient reports low back symptoms that include pain. The pain radiates to the left buttock, left thigh and left lower leg. The patient describes the pain as aching. The patient describes the severity of their symptoms as moderate in severity. Symptoms are exacerbated by standing, sitting, lifting and bending. Symptoms are relieved by opioid analgesics. Current treatment includes opioid analgesics and epidural injections which have not helped. Prior to being seen today the patient was previously evaluated Dr. Ethelene Hal. Symptoms present at the patient's previous evaluation included back pain. Past evaluation has included x-ray of the lumbar spine and MRI of the lumbar spine. Past treatment has included opioid analgesics, muscle relaxants, corticosteroids and epidural injections. MRI shows that she has a large disc at L4-5 on the left causing weakness of the dorsiflexors of the left foot.  Past Medical History Hyperthyroidism Skin cancer  Allergies No Known Drug Allergies.   Medication History Dilaudid (2MG  Tablet, 1 (one) Tablet Oral four times daily, as needed, Taken starting 03/07/2013) Active. Percocet (5-325MG  Tablet, 1 (one) Tablet Oral every six hours, as needed for pain, Taken starting 03/01/2013) Active. Multiple Vitamin (1 (one) Oral) Active. Advil (200MG  Capsule, 1 (one) Oral) Active. Elavil (100MG  Tablet, Oral) Active. Biotin ( Capsule, Oral) Active. Tylenol Severe Allergy (12.5-500MG  Tablet, Oral) Active. Omeprazole (20MG  Capsule DR, Oral) Active. Synthroid ( Tablet, Oral) Active. Vitamin D (400UNIT Capsule, 1 (one) Oral) Active.  Social History Alcohol use. current drinker; drinks beer; only occasionally per week Children. 3 Current work status. working part time Living situation. live with  spouse Marital status. married Tobacco use. current every day smoker; smoke(d) 3 or more pack(s) per day  Review of Systems General:Present- Night Sweats and Fatigue. Not Present- Chills, Fever, Appetite Loss, Feeling sick, Weight Gain and Weight Loss. Skin:Not Present- Itching, Rash, Skin Color Changes, Ulcer, Psoriasis and Change in Hair or Nails. HEENT:Present- Ringing in the Ears. Not Present- Sensitivity to light, Hearing problems and Nose Bleed. Neck:Not Present- Swollen Glands and Neck Mass. Respiratory:Not Present- Snoring, Chronic Cough, Bloody sputum and Dyspnea. Cardiovascular:Present- Swelling of Extremities. Not Present- Shortness of Breath, Chest Pain, Leg Cramps and Palpitations. Gastrointestinal:Present- Heartburn. Not Present- Bloody Stool, Abdominal Pain, Vomiting, Nausea and Incontinence of Stool. Female Genitourinary:Not Present- Blood in Urine, Menstrual Irregularities, Frequency, Incontinence and Nocturia. Musculoskeletal:Not Present- Muscle Weakness, Muscle Pain, Joint Stiffness, Joint Swelling, Joint Pain and Back Pain. Neurological:Present- Headaches. Not Present- Tingling, Numbness, Burning, Tremor and Dizziness. Psychiatric:Not Present- Anxiety, Depression and Memory Loss. Endocrine:Present- Excessive Thirst. Not Present- Cold Intolerance, Heat Intolerance and Excessive hunger. Hematology:Not Present- Abnormal Bleeding, Anemia, Blood Clots and Easy Bruising.  Vitals Weight: 118 lb Height: 62.5 in Body Surface Area: 1.54 m Body Mass Index: 21.24 kg/m Pain Level: 9/10 BP: 126/82 (Sitting, Left Arm, Standard)  Physical Exam Affect is appropriate. She is alert and oriented. Facial features are symmetric. Extraocular muscles are intact. She breathes without significant difficulty. Cardiovascular exam regular rate and rhythm. No murmurs are heard. Lungs are clear to auscultation. She has a negative straight leg raise on the left but  lifting her leg greater than 90 degrees in the seated position does cause some pain radiating into her left thigh region. Negative cross straight leg raise on the right but lifting her right leg greater than 90 degrees she can feel the pain in the left thigh region. She  has excellent reflexes at the knees as well as at the ankles. Excellent hip range of motion. Strength appears to be 5/5 and functional with exception of weakness of the dorsiflexors of the left foot. No skin or nail changes are noted. No calf tenderness is present. She has excellent pulses in the dorsalis pedis pulse bilaterally.   Assessment & Plan Lumbar disc herniation L4-L5 left She needs a microdiscectomy and hemilaminectomy at L4-5 on the left. The possible complications of spinal surgery number one could be infection, which is extremely rare. We do use antibiotics prior to the surgery and during surgery and after surgery. Number two is always a slight degree of probability that you could develop a blood clot in your leg after any type of surgery and we try our best to prevent that with aspirin post op when it is safe to begin. The third is a dural leak. That is the spinal fluid leak that could occur. At certain rare times the bone or the disc could literally stick to the dura which is the lining which contains the spinal fluid and we could develop a small tear in that lining which we then patch up. That is an extremely rare complication. The last and final complication is a recurrent disc rupture. That means that you could rupture another small piece of disc later on down the road and there is about a 2% chance of that.    Dimitri Ped, PA-C

## 2013-03-14 ENCOUNTER — Encounter (HOSPITAL_COMMUNITY): Payer: Self-pay | Admitting: Pharmacy Technician

## 2013-03-16 ENCOUNTER — Encounter (HOSPITAL_COMMUNITY)
Admission: RE | Admit: 2013-03-16 | Discharge: 2013-03-16 | Disposition: A | Discharge status: Home / Self Care | Payer: Medicare Other | Source: Ambulatory Visit | Attending: Orthopedic Surgery | Admitting: Orthopedic Surgery

## 2013-03-16 ENCOUNTER — Encounter (HOSPITAL_COMMUNITY): Payer: Self-pay

## 2013-03-16 ENCOUNTER — Ambulatory Visit (HOSPITAL_COMMUNITY)
Admission: RE | Admit: 2013-03-16 | Discharge: 2013-03-16 | Disposition: A | Discharge status: Home / Self Care | Payer: Medicare Other | Source: Ambulatory Visit | Attending: Surgical | Admitting: Surgical

## 2013-03-16 ENCOUNTER — Ambulatory Visit (HOSPITAL_COMMUNITY): Admission: RE | Admit: 2013-03-16 | Payer: Medicare Other | Source: Ambulatory Visit

## 2013-03-16 ENCOUNTER — Observation Stay (HOSPITAL_COMMUNITY)
Admission: RE | Admit: 2013-03-16 | Discharge: 2013-03-18 | Disposition: A | Discharge status: Home / Self Care | Payer: Medicare Other | Source: Ambulatory Visit | Attending: Surgical | Admitting: Surgical

## 2013-03-16 DIAGNOSIS — M48062 Spinal stenosis, lumbar region with neurogenic claudication: Secondary | ICD-10-CM

## 2013-03-16 DIAGNOSIS — I700 Atherosclerosis of aorta: Secondary | ICD-10-CM | POA: Insufficient documentation

## 2013-03-16 DIAGNOSIS — F17200 Nicotine dependence, unspecified, uncomplicated: Secondary | ICD-10-CM | POA: Insufficient documentation

## 2013-03-16 DIAGNOSIS — E039 Hypothyroidism, unspecified: Secondary | ICD-10-CM | POA: Insufficient documentation

## 2013-03-16 DIAGNOSIS — M4120 Other idiopathic scoliosis, site unspecified: Secondary | ICD-10-CM | POA: Insufficient documentation

## 2013-03-16 DIAGNOSIS — Z01812 Encounter for preprocedural laboratory examination: Secondary | ICD-10-CM | POA: Insufficient documentation

## 2013-03-16 DIAGNOSIS — Z79899 Other long term (current) drug therapy: Secondary | ICD-10-CM | POA: Insufficient documentation

## 2013-03-16 DIAGNOSIS — M216X9 Other acquired deformities of unspecified foot: Secondary | ICD-10-CM | POA: Insufficient documentation

## 2013-03-16 DIAGNOSIS — Z01810 Encounter for preprocedural cardiovascular examination: Secondary | ICD-10-CM | POA: Insufficient documentation

## 2013-03-16 DIAGNOSIS — Z01818 Encounter for other preprocedural examination: Secondary | ICD-10-CM | POA: Insufficient documentation

## 2013-03-16 DIAGNOSIS — M5126 Other intervertebral disc displacement, lumbar region: Principal | ICD-10-CM | POA: Diagnosis present

## 2013-03-16 DIAGNOSIS — K219 Gastro-esophageal reflux disease without esophagitis: Secondary | ICD-10-CM | POA: Insufficient documentation

## 2013-03-16 HISTORY — DX: Tachycardia, unspecified: R00.0

## 2013-03-16 LAB — COMPREHENSIVE METABOLIC PANEL
ALT: 6 U/L (ref 0–35)
AST: 14 U/L (ref 0–37)
Albumin: 3.5 g/dL (ref 3.5–5.2)
Alkaline Phosphatase: 76 U/L (ref 39–117)
BUN: 8 mg/dL (ref 6–23)
CO2: 29 mEq/L (ref 19–32)
Calcium: 9.6 mg/dL (ref 8.4–10.5)
Chloride: 95 mEq/L — ABNORMAL LOW (ref 96–112)
Creatinine, Ser: 0.73 mg/dL (ref 0.50–1.10)
GFR calc Af Amer: >90 mL/min (ref >90)
GFR calc non Af Amer: 88 mL/min — ABNORMAL LOW (ref >90)
Glucose, Bld: 73 mg/dL (ref 70–99)
Potassium: 4.1 mEq/L (ref 3.5–5.1)
Sodium: 130 mEq/L — ABNORMAL LOW (ref 135–145)
Total Bilirubin: 0.2 mg/dL — ABNORMAL LOW (ref 0.3–1.2)
Total Protein: 7 g/dL (ref 6.0–8.3)

## 2013-03-16 LAB — URINALYSIS, ROUTINE W REFLEX MICROSCOPIC
Glucose, UA: NEGATIVE mg/dL
Hgb urine dipstick: NEGATIVE
Ketones, ur: NEGATIVE mg/dL
Leukocytes, UA: NEGATIVE
Nitrite: NEGATIVE
Protein, ur: NEGATIVE mg/dL
Specific Gravity, Urine: 1.012 (ref 1.005–1.030)
Urobilinogen, UA: 0.2 mg/dL (ref 0.0–1.0)
pH: 7 (ref 5.0–8.0)

## 2013-03-16 LAB — CBC
MCH: 32.3 pg (ref 26.0–34.0)
MCHC: 34.2 g/dL (ref 30.0–36.0)
Platelets: 288 10*3/uL (ref 150–400)

## 2013-03-16 LAB — APTT: aPTT: 30 seconds (ref 24–37)

## 2013-03-16 LAB — PROTIME-INR
INR: 0.91 (ref 0.00–1.49)
Prothrombin Time: 12.1 seconds (ref 11.6–15.2)

## 2013-03-16 LAB — SURGICAL PCR SCREEN: MRSA, PCR: NEGATIVE

## 2013-03-16 NOTE — Pre-Procedure Instructions (Addendum)
PREOP EKG, CXR AND BACK XRAYS WERE DONE TODAY AT [facility] AS PER ORDERS DR. Darrelyn Hillock.

## 2013-03-16 NOTE — Patient Instructions (Addendum)
YOUR SURGERY IS SCHEDULED AT [facility]  ON:  Friday 7/25  REPORT TO [facility] SHORT STAY CENTER AT:  12:00 PM      PHONE # FOR SHORT STAY IS [phone]  DO NOT EAT ANYTHING AFTER MIDNIGHT TONIGHT.  NO FOOD, NO CHEWING GUM, NO MINTS, NO CANDIES, NO CHEWING TOBACCO. YOU MAY HAVE CLEAR LIQUIDS - LIKE WATER, COFFEE ( NO MILK OR MILK PRODUCTS )   -TO DRINK FROM MIDNIGHT TONIGHT UNTIL 8:00 AM TOMORROW - THE DAY OF SURGERY.   NOTHING TO DRINK AFTER 8:00 AM TOMORROW.  PLEASE TAKE THE FOLLOWING MEDICATIONS THE AM OF YOUR SURGERY WITH A FEW SIPS OF WATER:  AMITRIPTYLINE, SYNTHROID, HYDROMORPHONE IF NEEDED FOR PAIN    DO NOT BRING VALUABLES, MONEY, CREDIT CARDS.  DO NOT WEAR JEWELRY, MAKE-UP, NAIL POLISH AND NO METAL PINS OR CLIPS IN YOUR HAIR. CONTACT LENS, DENTURES / PARTIALS, GLASSES SHOULD NOT BE WORN TO SURGERY AND IN MOST CASES-HEARING AIDS WILL NEED TO BE REMOVED.  BRING YOUR GLASSES CASE, ANY EQUIPMENT NEEDED FOR YOUR CONTACT LENS. FOR PATIENTS ADMITTED TO THE HOSPITAL--CHECK OUT TIME THE DAY OF DISCHARGE IS 11:00 AM.  ALL INPATIENT ROOMS ARE PRIVATE - WITH BATHROOM, TELEPHONE, TELEVISION AND WIFI INTERNET.                             PLEASE READ OVER ANY  FACT SHEETS THAT YOU WERE GIVEN: MRSA INFORMATION, INCENTIVE SPIROMETER INFORMATION. FAILURE TO FOLLOW THESE INSTRUCTIONS MAY RESULT IN THE CANCELLATION OF YOUR SURGERY.   PATIENT SIGNATURE_________________________________

## 2013-03-17 ENCOUNTER — Encounter (HOSPITAL_COMMUNITY)
Admission: RE | Disposition: A | Discharge status: Home / Self Care | Payer: Self-pay | Source: Ambulatory Visit | Attending: Orthopedic Surgery

## 2013-03-17 ENCOUNTER — Encounter (HOSPITAL_COMMUNITY): Payer: Self-pay | Admitting: *Deleted

## 2013-03-17 ENCOUNTER — Inpatient Hospital Stay (HOSPITAL_COMMUNITY): Payer: Medicare Other

## 2013-03-17 ENCOUNTER — Encounter (HOSPITAL_COMMUNITY): Payer: Self-pay | Admitting: Anesthesiology

## 2013-03-17 ENCOUNTER — Inpatient Hospital Stay (HOSPITAL_COMMUNITY): Payer: Medicare Other | Admitting: Anesthesiology

## 2013-03-17 DIAGNOSIS — M48062 Spinal stenosis, lumbar region with neurogenic claudication: Secondary | ICD-10-CM

## 2013-03-17 DIAGNOSIS — M5126 Other intervertebral disc displacement, lumbar region: Secondary | ICD-10-CM | POA: Diagnosis present

## 2013-03-17 HISTORY — PX: LUMBAR LAMINECTOMY/DECOMPRESSION MICRODISCECTOMY: SHX5026

## 2013-03-17 SURGERY — LUMBAR LAMINECTOMY/DECOMPRESSION MICRODISCECTOMY 1 LEVEL
Anesthesia: General | Laterality: Left | Wound class: Clean

## 2013-03-17 MED ORDER — BUPIVACAINE LIPOSOME 1.3 % IJ SUSP
INTRAMUSCULAR | Status: DC | PRN
  Administered 2013-03-17: 20 mL

## 2013-03-17 MED ORDER — HYDROMORPHONE HCL PF 1 MG/ML IJ SOLN: 0.5000 mg | INTRAMUSCULAR | Status: DC | PRN

## 2013-03-17 MED ORDER — MENTHOL 3 MG MT LOZG: 1.0000 | LOZENGE | OROMUCOSAL | Status: DC | PRN

## 2013-03-17 MED ORDER — LIDOCAINE HCL (CARDIAC) 20 MG/ML IV SOLN
INTRAVENOUS | Status: DC | PRN
  Administered 2013-03-17: 60 mg via INTRAVENOUS

## 2013-03-17 MED ORDER — THROMBIN 5000 UNITS EX SOLR
OROMUCOSAL | Status: DC | PRN
  Administered 2013-03-17: 16:00:00 via TOPICAL

## 2013-03-17 MED ORDER — SUCCINYLCHOLINE CHLORIDE 20 MG/ML IJ SOLN
INTRAMUSCULAR | Status: DC | PRN
  Administered 2013-03-17: 80 mg via INTRAVENOUS

## 2013-03-17 MED ORDER — METHOCARBAMOL 100 MG/ML IJ SOLN
500.0000 mg | Freq: Four times a day (QID) | INTRAMUSCULAR | Status: DC | PRN
  Administered 2013-03-17: 500 mg via INTRAVENOUS
  Filled 2013-03-17: qty 5

## 2013-03-17 MED ORDER — PROMETHAZINE HCL 25 MG/ML IJ SOLN: 6.2500 mg | INTRAMUSCULAR | Status: DC | PRN

## 2013-03-17 MED ORDER — PROPOFOL 10 MG/ML IV BOLUS
INTRAVENOUS | Status: DC | PRN
  Administered 2013-03-17: 120 mg via INTRAVENOUS

## 2013-03-17 MED ORDER — NEOSTIGMINE METHYLSULFATE 1 MG/ML IJ SOLN
INTRAMUSCULAR | Status: DC | PRN
  Administered 2013-03-17: 3 mg via INTRAVENOUS

## 2013-03-17 MED ORDER — AMITRIPTYLINE HCL 100 MG PO TABS
100.0000 mg | ORAL_TABLET | Freq: Three times a day (TID) | ORAL | Status: DC
  Administered 2013-03-17: 100 mg via ORAL
  Filled 2013-03-17 (×4): qty 1

## 2013-03-17 MED ORDER — THROMBIN 5000 UNITS EX SOLR
CUTANEOUS | Status: AC
  Filled 2013-03-17: qty 10000

## 2013-03-17 MED ORDER — SODIUM CHLORIDE 0.9 % IR SOLN
Status: DC | PRN
  Administered 2013-03-17: 16:00:00

## 2013-03-17 MED ORDER — HYDROMORPHONE HCL PF 1 MG/ML IJ SOLN
INTRAMUSCULAR | Status: AC
  Filled 2013-03-17: qty 1

## 2013-03-17 MED ORDER — CEFAZOLIN SODIUM 1-5 GM-% IV SOLN
1.0000 g | Freq: Three times a day (TID) | INTRAVENOUS | Status: DC
  Administered 2013-03-17 – 2013-03-18 (×2): 1 g via INTRAVENOUS
  Filled 2013-03-17 (×3): qty 50

## 2013-03-17 MED ORDER — ZOLPIDEM TARTRATE 5 MG PO TABS
5.0000 mg | ORAL_TABLET | Freq: Every evening | ORAL | Status: DC | PRN
  Administered 2013-03-17: 5 mg via ORAL
  Filled 2013-03-17: qty 1

## 2013-03-17 MED ORDER — CEFAZOLIN SODIUM-DEXTROSE 2-3 GM-% IV SOLR
INTRAVENOUS | Status: AC
  Filled 2013-03-17: qty 50

## 2013-03-17 MED ORDER — OXYCODONE-ACETAMINOPHEN 5-325 MG PO TABS
1.0000 | ORAL_TABLET | ORAL | Status: DC | PRN
  Administered 2013-03-17 – 2013-03-18 (×4): 1 via ORAL
  Filled 2013-03-17 (×4): qty 1

## 2013-03-17 MED ORDER — PHENOL 1.4 % MT LIQD: 1.0000 | OROMUCOSAL | Status: DC | PRN

## 2013-03-17 MED ORDER — BUPIVACAINE LIPOSOME 1.3 % IJ SUSP
20.0000 mL | Freq: Once | INTRAMUSCULAR | Status: DC
  Filled 2013-03-17: qty 20

## 2013-03-17 MED ORDER — CEFAZOLIN SODIUM-DEXTROSE 2-3 GM-% IV SOLR
2.0000 g | INTRAVENOUS | Status: AC
  Administered 2013-03-17: 2 g via INTRAVENOUS

## 2013-03-17 MED ORDER — FLEET ENEMA 7-19 GM/118ML RE ENEM: 1.0000 | ENEMA | Freq: Once | RECTAL | Status: AC | PRN

## 2013-03-17 MED ORDER — LACTATED RINGERS IV SOLN
INTRAVENOUS | Status: DC
  Administered 2013-03-17 (×2): via INTRAVENOUS

## 2013-03-17 MED ORDER — ONDANSETRON HCL 4 MG/2ML IJ SOLN
INTRAMUSCULAR | Status: DC | PRN
  Administered 2013-03-17: 4 mg via INTRAVENOUS

## 2013-03-17 MED ORDER — DEXAMETHASONE SODIUM PHOSPHATE 10 MG/ML IJ SOLN
INTRAMUSCULAR | Status: DC | PRN
  Administered 2013-03-17: 10 mg via INTRAVENOUS

## 2013-03-17 MED ORDER — ROCURONIUM BROMIDE 100 MG/10ML IV SOLN
INTRAVENOUS | Status: DC | PRN
  Administered 2013-03-17: 30 mg via INTRAVENOUS

## 2013-03-17 MED ORDER — LACTATED RINGERS IV SOLN: INTRAVENOUS | Status: DC

## 2013-03-17 MED ORDER — ACETAMINOPHEN 325 MG PO TABS: 650.0000 mg | ORAL_TABLET | ORAL | Status: DC | PRN

## 2013-03-17 MED ORDER — MUPIROCIN 2 % EX OINT
TOPICAL_OINTMENT | Freq: Two times a day (BID) | CUTANEOUS | Status: DC
  Administered 2013-03-17: 1 via NASAL
  Filled 2013-03-17: qty 22

## 2013-03-17 MED ORDER — LACTATED RINGERS IV SOLN
INTRAVENOUS | Status: DC
  Administered 2013-03-17: 1000 mL via INTRAVENOUS

## 2013-03-17 MED ORDER — HYDROMORPHONE HCL PF 1 MG/ML IJ SOLN
0.2500 mg | INTRAMUSCULAR | Status: DC | PRN
  Administered 2013-03-17 (×4): 0.5 mg via INTRAVENOUS

## 2013-03-17 MED ORDER — FENTANYL CITRATE 0.05 MG/ML IJ SOLN
INTRAMUSCULAR | Status: DC | PRN
  Administered 2013-03-17: 25 ug via INTRAVENOUS
  Administered 2013-03-17: 100 ug via INTRAVENOUS
  Administered 2013-03-17 (×2): 25 ug via INTRAVENOUS
  Administered 2013-03-17: 50 ug via INTRAVENOUS
  Administered 2013-03-17: 25 ug via INTRAVENOUS

## 2013-03-17 MED ORDER — GLYCOPYRROLATE 0.2 MG/ML IJ SOLN
INTRAMUSCULAR | Status: DC | PRN
  Administered 2013-03-17: 0.4 mg via INTRAVENOUS

## 2013-03-17 MED ORDER — LEVOTHYROXINE SODIUM 75 MCG PO TABS
75.0000 ug | ORAL_TABLET | Freq: Every day | ORAL | Status: DC
  Administered 2013-03-18: 75 ug via ORAL
  Filled 2013-03-17 (×2): qty 1

## 2013-03-17 MED ORDER — MIDAZOLAM HCL 5 MG/5ML IJ SOLN
INTRAMUSCULAR | Status: DC | PRN
  Administered 2013-03-17 (×2): 1 mg via INTRAVENOUS

## 2013-03-17 MED ORDER — ONDANSETRON HCL 4 MG/2ML IJ SOLN: 4.0000 mg | INTRAMUSCULAR | Status: DC | PRN

## 2013-03-17 MED ORDER — POLYETHYLENE GLYCOL 3350 17 G PO PACK
17.0000 g | PACK | Freq: Every day | ORAL | Status: DC | PRN
  Filled 2013-03-17: qty 1

## 2013-03-17 MED ORDER — METHOCARBAMOL 500 MG PO TABS
500.0000 mg | ORAL_TABLET | Freq: Four times a day (QID) | ORAL | Status: DC | PRN
  Administered 2013-03-18 (×2): 500 mg via ORAL
  Filled 2013-03-17 (×2): qty 1

## 2013-03-17 MED ORDER — BACITRACIN-NEOMYCIN-POLYMYXIN 400-5-5000 EX OINT
TOPICAL_OINTMENT | CUTANEOUS | Status: DC | PRN
  Administered 2013-03-17: 1 via TOPICAL

## 2013-03-17 MED ORDER — MEPERIDINE HCL 50 MG/ML IJ SOLN
6.2500 mg | INTRAMUSCULAR | Status: DC | PRN
  Administered 2013-03-17 (×2): 12.5 mg via INTRAVENOUS

## 2013-03-17 MED ORDER — ACETAMINOPHEN 650 MG RE SUPP: 650.0000 mg | RECTAL | Status: DC | PRN

## 2013-03-17 MED ORDER — EPHEDRINE SULFATE 50 MG/ML IJ SOLN
INTRAMUSCULAR | Status: DC | PRN
  Administered 2013-03-17: 10 mg via INTRAVENOUS

## 2013-03-17 MED ORDER — HYDROCODONE-ACETAMINOPHEN 5-325 MG PO TABS
1.0000 | ORAL_TABLET | ORAL | Status: DC | PRN
  Administered 2013-03-17: 2 via ORAL
  Filled 2013-03-17: qty 2

## 2013-03-17 MED ORDER — BACITRACIN-NEOMYCIN-POLYMYXIN 400-5-5000 EX OINT
TOPICAL_OINTMENT | CUTANEOUS | Status: AC
  Filled 2013-03-17: qty 1

## 2013-03-17 MED ORDER — BISACODYL 10 MG RE SUPP: 10.0000 mg | Freq: Every day | RECTAL | Status: DC | PRN

## 2013-03-17 MED ORDER — CEFAZOLIN SODIUM-DEXTROSE 2-3 GM-% IV SOLR: 2.0000 g | INTRAVENOUS | Status: DC

## 2013-03-17 MED ORDER — BUPIVACAINE-EPINEPHRINE 0.5% -1:200000 IJ SOLN
INTRAMUSCULAR | Status: DC | PRN
  Administered 2013-03-17: 15 mL

## 2013-03-17 MED ORDER — BUPIVACAINE-EPINEPHRINE (PF) 0.5% -1:200000 IJ SOLN
INTRAMUSCULAR | Status: AC
  Filled 2013-03-17: qty 10

## 2013-03-17 SURGICAL SUPPLY — 45 items
BAG ZIPLOCK 12X15 (MISCELLANEOUS) IMPLANT
BENZOIN TINCTURE PRP APPL 2/3 (GAUZE/BANDAGES/DRESSINGS) ×2 IMPLANT
CLEANER TIP ELECTROSURG 2X2 (MISCELLANEOUS) ×2 IMPLANT
CLOTH BEACON ORANGE TIMEOUT ST (SAFETY) ×2 IMPLANT
CONT SPECI 4OZ STER CLIK (MISCELLANEOUS) ×4 IMPLANT
DRAIN PENROSE 18X1/4 LTX STRL (WOUND CARE) IMPLANT
DRAPE LG THREE QUARTER DISP (DRAPES) ×2 IMPLANT
DRAPE MICROSCOPE LEICA (MISCELLANEOUS) ×2 IMPLANT
DRAPE POUCH INSTRU U-SHP 10X18 (DRAPES) ×2 IMPLANT
DRAPE SURG 17X11 SM STRL (DRAPES) ×2 IMPLANT
DRSG ADAPTIC 3X8 NADH LF (GAUZE/BANDAGES/DRESSINGS) ×2 IMPLANT
DRSG PAD ABDOMINAL 8X10 ST (GAUZE/BANDAGES/DRESSINGS) ×6 IMPLANT
DRSG TEGADERM 4X4.75 (GAUZE/BANDAGES/DRESSINGS) ×2 IMPLANT
DURAPREP 26ML APPLICATOR (WOUND CARE) ×2 IMPLANT
ELECT REM PT RETURN 9FT ADLT (ELECTROSURGICAL) ×2
ELECTRODE REM PT RTRN 9FT ADLT (ELECTROSURGICAL) ×1 IMPLANT
GLOVE BIOGEL PI IND STRL 8 (GLOVE) ×2 IMPLANT
GLOVE BIOGEL PI INDICATOR 8 (GLOVE) ×2
GLOVE ECLIPSE 8.0 STRL XLNG CF (GLOVE) ×4 IMPLANT
GOWN PREVENTION PLUS LG XLONG (DISPOSABLE) ×4 IMPLANT
GOWN STRL REIN XL XLG (GOWN DISPOSABLE) ×4 IMPLANT
KIT BASIN OR (CUSTOM PROCEDURE TRAY) ×2 IMPLANT
KIT POSITIONING SURG ANDREWS (MISCELLANEOUS) ×2 IMPLANT
MANIFOLD NEPTUNE II (INSTRUMENTS) ×2 IMPLANT
NEEDLE SPNL 18GX3.5 QUINCKE PK (NEEDLE) ×4 IMPLANT
NS IRRIG 1000ML POUR BTL (IV SOLUTION) IMPLANT
PATTIES SURGICAL .5 X.5 (GAUZE/BANDAGES/DRESSINGS) ×2 IMPLANT
PATTIES SURGICAL .75X.75 (GAUZE/BANDAGES/DRESSINGS) IMPLANT
PATTIES SURGICAL 1X1 (DISPOSABLE) IMPLANT
PIN SAFETY NICK PLATE  2 MED (MISCELLANEOUS)
PIN SAFETY NICK PLATE 2 MED (MISCELLANEOUS) IMPLANT
POSITIONER SURGICAL ARM (MISCELLANEOUS) IMPLANT
SPONGE LAP 4X18 X RAY DECT (DISPOSABLE) ×4 IMPLANT
SPONGE SURGIFOAM ABS GEL 100 (HEMOSTASIS) ×2 IMPLANT
STAPLER VISISTAT 35W (STAPLE) ×2 IMPLANT
SUT VIC AB 0 CT1 27 (SUTURE) ×1
SUT VIC AB 0 CT1 27XBRD ANTBC (SUTURE) ×1 IMPLANT
SUT VIC AB 1 CT1 27 (SUTURE) ×3
SUT VIC AB 1 CT1 27XBRD ANTBC (SUTURE) ×3 IMPLANT
SYR 20CC LL (SYRINGE) ×2 IMPLANT
SYR 30ML LL (SYRINGE) ×2 IMPLANT
TAPE CLOTH SURG 6X10 WHT LF (GAUZE/BANDAGES/DRESSINGS) ×2 IMPLANT
TOWEL OR 17X26 10 PK STRL BLUE (TOWEL DISPOSABLE) ×4 IMPLANT
TRAY LAMINECTOMY (CUSTOM PROCEDURE TRAY) ×2 IMPLANT
WATER STERILE IRR 1500ML POUR (IV SOLUTION) IMPLANT

## 2013-03-17 NOTE — Op Note (Signed)
NAMEJOWANDA, [name]                ACCOUNT NO.:  [id number]  MEDICAL RECORD NO.:  [id number]  LOCATION:  [id number]                         FACILITY:  [facility]  PHYSICIAN:  Georges Lynch. Aleea Hendry, M.D.DATE OF BIRTH:  05/12/1946  DATE OF PROCEDURE:  03/17/2013 DATE OF DISCHARGE:                              OPERATIVE REPORT   SURGEON:  Georges Lynch. Darrelyn Hillock, M.D.  ASSISTANT:  Marlowe Kays, M.D.  PREOPERATIVE DIAGNOSES: 1. A large herniated foraminal disk herniation at L4-5 on the left. 2. Lateral recess stenosis at L4-5 on the left. 3. Partial footdrop on the left.  POSTOPERATIVE DIAGNOSES: 1. A large herniated foraminal disk herniation at L4-5 on the left. 2. Lateral recess stenosis at L4-5 on the left. 3. Partial footdrop on the left.  OPERATION: 1. Hemilaminectomy at L4-L5 on the left. 2. Foraminotomies for the L4 root on the left. 3. Foraminotomy for the L5 root on the left. 4. Microdiskectomy for foraminal disk at L4-5 on the left.  PROCEDURE:  Under general anesthesia, the patient on the spinal frame, routine orthopedic prep and draping of the back was carried out by the nurse with sterile gloves.  At this time, the appropriate time-out was carried out.  Also at this time, prior to taking the patient back to surgery, I marked the left side of her back for localization purposes. After everything was carried out, sterile towels were applied.  Two needles were placed in the back for localization purposes.  X-ray was taken.  Incision then was made over the L4-5 interspace.  Bleeders were identified and cauterized.  The muscle was separated from the lamina and spinous process.  Another x-ray was taken.  At this time, the [facility] retractors were inserted.  I then made sure that we had a good identification of the L4 and L5 lamina and spinous process.  We brought the microscope in and we started our hemilaminectomy in the usual fashion at L4-5.  We went out far laterally, and distally,  so we could decompress the lateral recess which was extremely tight.  Great care was taken.  We did use the microscope, I liked that.  Great care was taken not to injure the underlying dura.  We then removed a portion of the ligamentum flavum.  We then easily identified the L5 root.  We gently retracted the root and was quite swollen.  There was an indentation in the root.  We identified the lateral recess veins.  We decompressed the lateral recess.  Did foraminotomies for the root above and below as well.  After that, we gently retracted the root, and then put a needle in the disk space.  X-ray was taken to verify the position.  Following that, cruciate incision made in the posterior longitudinal ligament and microdiskectomy was carried out.  Note, I utilized the Epstein curettes and the nerve hook to tease out the portions of subligamentous disk rupture.  I utilized the Epstein curette to go out laterally to decompress the foramina to make sure the nerve was free there as well. I then went distally and did foraminotomy and traced the 5 root, it was free.  After we completed the microdiskectomy, I then went  back again and checked the subligamentous space and a lateral recess and the foramen to make sure we were free.  Following that, we thoroughly irrigated out the area.  Specimen was sent to the lab from the L4-5.  I then loosely applied some thrombin-soaked Gelfoam and closed the wound layers in usual fashion except I left a small distal deep and proximal part of the wound open for drainage purposes.  I then injected 20 mL of Exparel into lumbar musculature.  I then injected 30 mL of 0.5% Marcaine and epinephrine into the subcu, closed the wound in usual fashion. Sterile dressings were applied.  The patient had 2 g of IV Ancef preop.          ______________________________ Georges Lynch. Darrelyn Hillock, M.D.     RAG/MEDQ  D:  03/17/2013  T:  03/17/2013  Job:  [id number]

## 2013-03-17 NOTE — Anesthesia Preprocedure Evaluation (Signed)
Anesthesia Evaluation  Patient identified by MRN, date of birth, ID band Patient awake  General Assessment Comment:Hyperthyroidism Skin cancer   Reviewed: Allergy & Precautions, H&P , NPO status , Patient's Chart, lab work & pertinent test results  Airway Mallampati: II TM Distance: >3 FB Neck ROM: Full    Dental no notable dental hx.    Pulmonary Current Smoker,  breath sounds clear to auscultation  Pulmonary exam normal       Cardiovascular Exercise Tolerance: Good Rhythm:Regular Rate:Normal     Neuro/Psych negative neurological ROS  negative psych ROS   GI/Hepatic Neg liver ROS, GERD-  Medicated,  Endo/Other  Hypothyroidism   Renal/GU negative Renal ROS  negative genitourinary   Musculoskeletal negative musculoskeletal ROS (+)   Abdominal   Peds negative pediatric ROS (+)  Hematology negative hematology ROS (+)   Anesthesia Other Findings   Reproductive/Obstetrics negative OB ROS                           Anesthesia Physical Anesthesia Plan  ASA: II  Anesthesia Plan: General   Post-op Pain Management:    Induction: Intravenous  Airway Management Planned: Oral ETT  Additional Equipment:   Intra-op Plan:   Post-operative Plan: Extubation in OR  Informed Consent: I have reviewed the patients History and Physical, chart, labs and discussed the procedure including the risks, benefits and alternatives for the proposed anesthesia with the patient or authorized representative who has indicated his/her understanding and acceptance.   Dental advisory given  Plan Discussed with: CRNA  Anesthesia Plan Comments:         Anesthesia Quick Evaluation

## 2013-03-17 NOTE — Interval H&P Note (Signed)
History and Physical Interval Note:  03/17/2013 2:25 PM  [name]  has presented today for surgery, with the diagnosis of HERNIATED DISC  The various methods of treatment have been discussed with the patient and family. After consideration of risks, benefits and other options for treatment, the patient has consented to  Procedure(s): LUMBAR LAMINECTOMY/DECOMPRESSION MICRODISCECTOMY L4-5 ON LEFT (Left) as a surgical intervention .  The patient's history has been reviewed, patient examined, no change in status, stable for surgery.  I have reviewed the patient's chart and labs.  Questions were answered to the patient's satisfaction.     Zeola Brys A

## 2013-03-17 NOTE — Transfer of Care (Signed)
Immediate Anesthesia Transfer of Care Note  Patient: [name]  Procedure(s) Performed: Procedure(s): LUMBAR LAMINECTOMY/DECOMPRESSION IF RECESS STENOSIS FORAMINOTOMIE OF L4 ROOT AND L5,MICRODISECTOMY  L4-5 ON LEFT (Left)  Patient Location: PACU  Anesthesia Type:General  Level of Consciousness: awake, alert , oriented and patient cooperative  Airway & Oxygen Therapy: Patient Spontanous Breathing and Patient connected to nasal cannula oxygen  Post-op Assessment: Report given to PACU RN, Post -op Vital signs reviewed and stable and Patient moving all extremities  Post vital signs: stable  Complications: No apparent anesthesia complications

## 2013-03-17 NOTE — Anesthesia Postprocedure Evaluation (Signed)
  Anesthesia Post-op Note  Patient: [name]  Procedure(s) Performed: Procedure(s) (LRB): LUMBAR LAMINECTOMY/DECOMPRESSION IF RECESS STENOSIS FORAMINOTOMIE OF L4 ROOT AND L5,MICRODISECTOMY  L4-5 ON LEFT (Left)  Patient Location: PACU  Anesthesia Type: General  Level of Consciousness: awake and alert   Airway and Oxygen Therapy: Patient Spontanous Breathing  Post-op Pain: mild  Post-op Assessment: Post-op Vital signs reviewed, Patient's Cardiovascular Status Stable, Respiratory Function Stable, Patent Airway and No signs of Nausea or vomiting  Last Vitals:  Filed Vitals:   03/17/13 1815  BP: 114/62  Pulse: 86  Temp:   Resp: 13    Post-op Vital Signs: stable   Complications: No apparent anesthesia complications

## 2013-03-17 NOTE — Brief Op Note (Signed)
03/17/2013  5:20 PM  PATIENT:  Guilford Shi  67 y.o. female  PRE-OPERATIVE DIAGNOSIS:  HERNIATED DISC  POST-OPERATIVE DIAGNOSIS:  HERNIATED DISC,Lumbar Herniated ,Foraminal Disc with Lateral Recess Stenosis.  PROCEDURE:  Procedure(s): LUMBAR LAMINECTOMY/DECOMPRESSION IF RECESS STENOSIS FORAMINOTOMIE OF L4 ROOT AND L5,MICRODISECTOMY  L4-5 ON LEFT (Left)  SURGEON:  Surgeon(s) and Role:    * Jacki Cones, MD - Primary    * Drucilla Schmidt, MD - Assisting     ASSISTANTS: Marlowe Kays MD   ANESTHESIA:   general  EBL:  Total I/O In: 1600 [I.V.:1600] Out: 100 [Blood:100]  BLOOD ADMINISTERED:none  DRAINS: none   LOCAL MEDICATIONS USED:  MARCAINE 30cc of 0.25% with Epinephrine Injected in SubQ and Exparel 20cc injected in Muscle.   SPECIMEN:  Source of Specimen:  L-4-L-5 interspace.  DISPOSITION OF SPECIMEN:  PATHOLOGY  COUNTS:  YES  TOURNIQUET:  * No tourniquets in log *  DICTATION: .Other Dictation: Dictation Number [id number]  PLAN OF CARE: Admit for overnight observation  PATIENT DISPOSITION:  PACU - hemodynamically stable.   Delay start of Pharmacological VTE agent (>24hrs) due to surgical blood loss or risk of bleeding: yes

## 2013-03-18 MED ORDER — DIPHENHYDRAMINE HCL 25 MG PO CAPS
25.0000 mg | ORAL_CAPSULE | Freq: Once | ORAL | Status: AC
Start: 2013-03-18 — End: 2013-03-18
  Administered 2013-03-18: 25 mg via ORAL
  Filled 2013-03-18: qty 1

## 2013-03-18 NOTE — Progress Notes (Signed)
Patient c/o swelling to face after arise for a.m. Puffiness noted about eyes. Vision difficulty denied. Etiology of facial swelling unknown. Patient has been taking percocet at home with most recent dose accepted at 0122 for pain. Slept well during the night. [employer] PA returned page. 1x dose of benadryl 25mg  given po to patient as ordered for questionable localized allergic reaction.

## 2013-03-18 NOTE — Progress Notes (Signed)
   Subjective: 1 Day Post-Op Procedure(s) (LRB): LUMBAR LAMINECTOMY/DECOMPRESSION IF RECESS STENOSIS FORAMINOTOMIE OF L4 ROOT AND L5,MICRODISECTOMY  L4-5 ON LEFT (Left) Patient reports pain as mild.   Patient seen in rounds with Dr. Lequita Halt on weekend rounds. Patient is well, but has had some minor complaints of pain in the back, requiring pain medications Patient is ready to go home later today  Objective: Vital signs in last 24 hours: Temp:  [97.8 F (36.6 C)-98.6 F (37 C)] 98.6 F (37 C) (07/26 0530) Pulse Rate:  [81-102] 85 (07/26 0530) Resp:  [11-19] 16 (07/26 0530) BP: (100-136)/(52-86) 108/59 mmHg (07/26 0530) SpO2:  [94 %-100 %] 95 % (07/26 0530) Weight:  [54.432 kg (120 lb)] 54.432 kg (120 lb) (07/25 1845)  Intake/Output from previous day:  Intake/Output Summary (Last 24 hours) at 03/18/13 0743 Last data filed at 03/18/13 1610  Gross per 24 hour  Intake   2335 ml  Output   2800 ml  Net   -465 ml    Intake/Output this shift: Total I/O In: 60 [P.O.:60] Out: 500 [Urine:500]  Labs:  Recent Labs  03/16/13 1410  HGB 12.0    Recent Labs  03/16/13 1410  WBC 7.8  RBC 3.72*  HCT 35.1*  PLT 288    Recent Labs  03/16/13 1410  NA 130*  K 4.1  CL 95*  CO2 29  BUN 8  CREATININE 0.73  GLUCOSE 73  CALCIUM 9.6    Recent Labs  03/16/13 1410  INR 0.91    EXAM: General - Patient is Alert, Appropriate and Oriented Extremity - Neurovascular intact Sensation intact distally Dressing - clean, dry Motor Function - intact, moving foot and toes well on exam.   Assessment/Plan: 1 Day Post-Op Procedure(s) (LRB): LUMBAR LAMINECTOMY/DECOMPRESSION IF RECESS STENOSIS FORAMINOTOMIE OF L4 ROOT AND L5,MICRODISECTOMY  L4-5 ON LEFT (Left) Procedure(s) (LRB): LUMBAR LAMINECTOMY/DECOMPRESSION IF RECESS STENOSIS FORAMINOTOMIE OF L4 ROOT AND L5,MICRODISECTOMY  L4-5 ON LEFT (Left) Past Medical History  Diagnosis Date  . Hypothyroidism   . Rosacea   . GERD  (gastroesophageal reflux disease)     WOULD USE OTC MEDICATION IF NEEDED  . 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 Tachycardia     PT STATES HER HEART RATE USUALLY ABOVE 100   Active Problems:   Spinal stenosis, lumbar region, with neurogenic claudication   Herniated lumbar intervertebral disc  Estimated body mass index is 21.94 kg/(m^2) as calculated from the following:   Height as of this encounter: 5\' 2"  (1.575 m).   Weight as of this encounter: 54.432 kg (120 lb). Up with therapy Diet - Cardiac diet Follow up - in 2 weeks Activity - WBAT Disposition - Home Condition Upon Discharge - Stable D/C Meds - See DC Summary   Joe Tanney 03/18/2013, 7:43 AM

## 2013-03-18 NOTE — Progress Notes (Signed)
Discharged from floor via w/c, spouse with pt. No changes in assessment. [name]   

## 2013-03-18 NOTE — Evaluation (Signed)
Physical Therapy One Time Evaluation/Discharge Patient Details Name: [name] MRN: [id number] DOB: 1946/06/19 Today's Date: 03/18/2013 Time: 1478-2956 PT Time Calculation (min): 20 min  PT Assessment / Plan / Recommendation History of Present Illness  67 y.o. female admitted to [facility] for elective lumbar decompression, laminectomy and discectomy at the L4/5 level.  Surgery date was 03/17/13.    Clinical Impression  Pt is moving well, slowly due to post-op pain, but has husband's support at home.  All education completed and no further PT f/u needed at this time.      PT Assessment  Patent does not need any further PT services    Follow Up Recommendations  No PT follow up    Does the patient have the potential to tolerate intense rehabilitation    NA  Barriers to Discharge   none      Equipment Recommendations  None recommended by PT    Recommendations for Other Services   none     Precautions / Restrictions Precautions Precautions: Back Precaution Booklet Issued: Yes (comment) Precaution Comments: handout given and functional exaples reviewed.   Required Braces or Orthoses:  (none) Restrictions Weight Bearing Restrictions: No   Pertinent Vitals/Pain See vitals flow sheet.      Mobility  Bed Mobility Bed Mobility: Not assessed (pt seated EOB ) Details for Bed Mobility Assistance: did not practice.  Demonstrated bed mobility which pt and husband verbalize understanding of technique Transfers Sit to Stand: 5: Supervision;From bed;With upper extremity assist Stand to Sit: 5: Supervision;With upper extremity assist;To bed Details for Transfer Assistance: supervision for safety as pt is slow to move due to pain.   Ambulation/Gait Ambulation/Gait Assistance: 5: Supervision Ambulation Distance (Feet): 150 Feet Assistive device: None Ambulation/Gait Assistance Details: supervision due to slower gait speed and pt holding her back with bil hands.   Gait Pattern:  Step-through pattern Stairs: Yes Stairs Assistance: 4: Min assist Stairs Assistance Details (indicate cue type and reason): min hand held assist from husband with education on proper guarding and how to hold pt to assist her on the stairs.  Husband/pt demonstrated proficiency.   Stair Management Technique: One rail Right;Alternating pattern;Forwards;Other (comment) (hand held assist on the left.  ) Number of Stairs: 9       PT Goals(Current goals can be found in the care plan section) Acute Rehab PT Goals Patient Stated Goal: to go home and recooperate  Visit Information  Last PT Received On: 03/18/13 Assistance Needed: +1 PT/OT Co-Evaluation/Treatment: Yes History of Present Illness: 68 y.o. female admitted to [facility] for elective lumbar decompression, laminectomy and discectomy at the L4/5 level.  Surgery date was 03/17/13.         Prior Functioning  Home Living Family/patient expects to be discharged to:: Private residence Living Arrangements: Spouse/significant other (husband is a Charity fundraiser at [employer]) Available Help at Discharge: Family;Available 24 hours/day Type of Home: House Home Access: Stairs to enter [employer] of Steps: 3-7 Entrance Stairs-Rails: None Home Layout: One level Home Equipment: None Additional Comments: also has high toilet in other bathroom.  Pt tends to use tub/shower.  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[place]) Communication Communication: No difficulties    Cognition  Cognition Arousal/Alertness: Awake/alert Behavior During Therapy: WFL for tasks assessed/performed Overall Cognitive Status: Within Functional Limits for tasks assessed    Extremity/Trunk Assessment Upper Extremity Assessment Upper Extremity Assessment: Defer to OT evaluation Lower  Extremity Assessment Lower Extremity Assessment: Generalized weakness (normal weakness  for postop status) Cervical / Trunk Assessment Cervical / Trunk Assessment: Other exceptions Cervical / Trunk Exceptions: slightly flexed posture in standing.        End of Session PT - End of Session Activity Tolerance: Patient limited by fatigue;Patient limited by pain Patient left: in bed;with call bell/phone within reach;with family/visitor present    Lurena Joiner B. Hy Swiatek, PT, DPT [phone]   03/18/2013, 11:44 AM

## 2013-03-18 NOTE — Evaluation (Signed)
Occupational Therapy Evaluation Patient Details Name: [name] MRN: [id number] DOB: Dec 03, 1945 Today's Date: 03/18/2013 Time: 6962-9528 OT Time Calculation (min): 17 min  OT Assessment / Plan / Recommendation History of present illness     Clinical Impression   Pt was admitted for L4-5 decompression.  All education was completed.  Pt does not need any further OT at this time.      OT Assessment  Patient does not need any further OT services    Follow Up Recommendations  No OT follow up    Barriers to Discharge      Equipment Recommendations  None recommended by OT    Recommendations for Other Services    Frequency       Precautions / Restrictions Precautions Precautions: Back Precaution Booklet Issued: Yes (comment) Restrictions Weight Bearing Restrictions: No   Pertinent Vitals/Pain Back sore--pain not rated.  repositioned    ADL  Transfers/Ambulation Related to ADLs: ambulated with supervision to min guard, cues for sit to stand for back precautions.  ADL Comments: Reviewed adls/back precautions and showed toilet aide. Husband will help with adls as needed.  He helped with dressing today.  Pt overall set up for UB and mod LB bathing/max LB dressing    OT Diagnosis:    OT Problem List:   OT Treatment Interventions:     OT Goals(Current goals can be found in the care plan section)    Visit Information  Last OT Received On: 03/18/13 Assistance Needed: +1 PT/OT Co-Evaluation/Treatment: Yes       Prior Functioning     Home Living Family/patient expects to be discharged to:: Private residence Living Arrangements: Spouse/significant other Type of Home: House Additional Comments: also has high toilet in other bathroom.  Pt tends to use tub/shower.  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  Demonstrated bed mobility which pt and husband verbalize understanding of technique Transfers Transfers: Sit to Stand Sit to Stand: 5: Supervision;From bed;With upper extremity assist     Exercise     Balance     End of Session OT - End of Session Activity Tolerance: Patient tolerated treatment well Patient left: in chair;with call bell/phone within reach;with family/visitor present  GO Functional Assessment Tool Used: clinical judgment Functional Limitation: Self care Self Care Current Status (U1324): At least 60 percent but less than 80 percent impaired, limited or restricted Self Care Goal Status (M0102): At least 60 percent but less than 80 percent impaired, limited or restricted Self Care Discharge Status [phone]): At least 60 percent but less than 80 percent impaired, limited or restricted   [facility] 03/18/2013, 9:45 AM Marica Otter, OTR/L [phone] 03/18/2013

## 2013-03-20 ENCOUNTER — Encounter (HOSPITAL_COMMUNITY): Payer: Self-pay | Admitting: Orthopedic Surgery

## 2013-03-20 NOTE — Discharge Summary (Signed)
Physician Discharge Summary   Patient ID: [name] MRN: [id number] DOB/AGE: [birth year] 67 y.o.  Admit date: 03/17/2013 Discharge date: 03/18/2013  Primary Diagnosis: Lumbar disc herniation   Admission Diagnoses:  Past Medical History  Diagnosis Date  . Hypothyroidism   . Rosacea   . GERD (gastroesophageal reflux disease)     WOULD USE OTC MEDICATION IF NEEDED  . 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 Tachycardia     PT STATES HER HEART RATE USUALLY ABOVE 100   Discharge Diagnoses:   Active Problems:   Spinal stenosis, lumbar region, with neurogenic claudication   Herniated lumbar intervertebral disc  Estimated body mass index is 21.94 kg/(m^2) as calculated from the following:   Height as of this encounter: 5\' 2"  (1.575 m).   Weight as of this encounter: 54.432 kg (120 lb).  Procedure:  Procedure(s) (LRB): LUMBAR LAMINECTOMY/DECOMPRESSION IF RECESS STENOSIS FORAMINOTOMIE OF L4 ROOT AND L5,MICRODISECTOMY  L4-5 ON LEFT (Left)   Consults: None  HPI: The patient is a 67 year old female who presents with back pain. The patient reports low back symptoms that include pain. The pain radiates to the left buttock, left thigh and left lower leg. The patient describes the pain as aching. The patient describes the severity of their symptoms as moderate in severity. Symptoms are exacerbated by standing, sitting, lifting and bending. Symptoms are relieved by opioid analgesics. Current treatment includes opioid analgesics and epidural injections which have not helped. Prior to being seen today the patient was previously evaluated Dr. Ethelene Hal. Symptoms present at the patient's previous evaluation included back pain. Past evaluation has included x-ray of the lumbar spine and MRI of the lumbar spine. Past treatment has included opioid analgesics, muscle relaxants, corticosteroids and epidural injections. MRI shows that she has a large disc at L4-5 on the left causing weakness of  the dorsiflexors of the left foot.     Laboratory Data: Hospital Outpatient Visit on 03/16/2013  Component Date Value Range Status  . MRSA, PCR 03/16/2013 NEGATIVE  NEGATIVE Final  . Staphylococcus aureus 03/16/2013 POSITIVE* NEGATIVE Final   Comment:                                 The Xpert SA Assay (FDA                          approved for NASAL specimens                          in patients over 76 years of age),                          is one component of                          a comprehensive surveillance                          program.  Test performance has                          been validated by [employer]  [facility] for patients greater                          than or equal to 8 year old.                          It is not intended                          to diagnose infection nor to                          guide or monitor treatment.  . WBC 03/16/2013 7.8  4.0 - 10.5 K/uL Final  . RBC 03/16/2013 3.72* 3.87 - 5.11 MIL/uL Final  . Hemoglobin 03/16/2013 12.0  12.0 - 15.0 g/dL Final  . HCT 16/05/9603 35.1* 36.0 - 46.0 % Final  . MCV 03/16/2013 94.4  78.0 - 100.0 fL Final  . MCH 03/16/2013 32.3  26.0 - 34.0 pg Final  . MCHC 03/16/2013 34.2  30.0 - 36.0 g/dL Final  . RDW 54/04/8118 12.6  11.5 - 15.5 % Final  . Platelets 03/16/2013 288  150 - 400 K/uL Final  . aPTT 03/16/2013 30  24 - 37 seconds Final  . Sodium 03/16/2013 130* 135 - 145 mEq/L Final  . Potassium 03/16/2013 4.1  3.5 - 5.1 mEq/L Final  . Chloride 03/16/2013 95* 96 - 112 mEq/L Final  . CO2 03/16/2013 29  19 - 32 mEq/L Final  . Glucose, Bld 03/16/2013 73  70 - 99 mg/dL Final  . BUN 14/78/2956 8  6 - 23 mg/dL Final  . Creatinine, Ser 03/16/2013 0.73  0.50 - 1.10 mg/dL Final  . Calcium 21/30/8657 9.6  8.4 - 10.5 mg/dL Final  . Total Protein 03/16/2013 7.0  6.0 - 8.3 g/dL Final  . Albumin 84/69/6295 3.5  3.5 - 5.2 g/dL Final  . AST 28/41/3244 14  0 - 37 U/L Final  . ALT 03/16/2013 6   0 - 35 U/L Final  . Alkaline Phosphatase 03/16/2013 76  39 - 117 U/L Final  . Total Bilirubin 03/16/2013 0.2* 0.3 - 1.2 mg/dL Final  . GFR calc non Af Amer 03/16/2013 88* >90 mL/min Final  . GFR calc Af Amer 03/16/2013 >90  >90 mL/min Final   Comment:                                 The eGFR has been calculated                          using the CKD EPI equation.                          This calculation has not been                          validated in all clinical                          situations.                          eGFR's persistently                          <  90 mL/min signify                          possible Chronic Kidney Disease.  Marland Kitchen Prothrombin Time 03/16/2013 12.1  11.6 - 15.2 seconds Final  . INR 03/16/2013 0.91  0.00 - 1.49 Final  . Color, Urine 03/16/2013 YELLOW  YELLOW Final  . APPearance 03/16/2013 CLEAR  CLEAR Final  . Specific Gravity, Urine 03/16/2013 1.012  1.005 - 1.030 Final  . pH 03/16/2013 7.0  5.0 - 8.0 Final  . Glucose, UA 03/16/2013 NEGATIVE  NEGATIVE mg/dL Final  . Hgb urine dipstick 03/16/2013 NEGATIVE  NEGATIVE Final  . Bilirubin Urine 03/16/2013 MODERATE* NEGATIVE Final  . Ketones, ur 03/16/2013 NEGATIVE  NEGATIVE mg/dL Final  . Protein, ur 16/05/9603 NEGATIVE  NEGATIVE mg/dL Final  . Urobilinogen, UA 03/16/2013 0.2  0.0 - 1.0 mg/dL Final  . Nitrite 54/04/8118 NEGATIVE  NEGATIVE Final  . Leukocytes, UA 03/16/2013 NEGATIVE  NEGATIVE Final   MICROSCOPIC NOT DONE ON URINES WITH NEGATIVE PROTEIN, BLOOD, LEUKOCYTES, NITRITE, OR GLUCOSE <1000 mg/dL.     X-Rays:Dg Chest 2 View  03/16/2013   *RADIOLOGY REPORT*  Clinical Data: Lumbar disc herniation.  CHEST - 2 VIEW  Comparison: March 21, 2007.  Findings: Cardiomediastinal silhouette appears normal. Right lung is clear.  Minimal linear density is noted laterally in the left lung base consistent with scarring or small focus of subsegmental atelectasis.  Bony thorax is intact.  IMPRESSION: Small focus of  scarring or subsegmental atelectasis seen laterally in left lung base.   Original Report Authenticated By: Lupita Raider.,  M.D.   Dg Lumbar Spine 2-3 Views  03/16/2013   *RADIOLOGY REPORT*  Clinical Data: Preoperative evaluation.  LUMBAR SPINE - 2-3 VIEW  Comparison: 01/11/2003.  Findings: There are five non-rib bearing lumbar-type vertebral bodies which are labeled L1-L5.  There is slight scoliosis convexity to the left.  There is narrowing of the intervertebral disc space at L4-L5 level.  There is multilevel marginal osteophyte formation representing degenerative spondylosis.  There is slight anterior slippage or listhesis of the body of L4 on the body of L5. No fracture or bony destruction is seen.  Pelvic phleboliths are seen.  Nonaneurysmal aortic and arterial calcifications are present.  IMPRESSION: Lumbar vertebral bodies are numbered.  Slight scoliosis.  Changes of degenerative disc disease and degenerative spondylosis.  Slight anterior slippage or listhesis of body of L4 on body of L5. Nonaneurysmal aortic and arterial calcifications are present.   Original Report Authenticated By: Onalee Hua Call   Dg Spine Portable 1 View  03/17/2013   *RADIOLOGY REPORT*  Clinical Data: Intraoperative localization for lumbar spine surgery.  PORTABLE SPINE - 1 VIEW  Comparison: Earlier film, same date.  Findings: Surgical retractors are in place and there is a spinal needle marking the L4-5 disc space level.  IMPRESSION: L4-5 marked intraoperatively.   Original Report Authenticated By: Rudie Meyer, M.D.   Dg Spine Portable 1 View  03/17/2013   *RADIOLOGY REPORT*  Clinical Data: L4-L5 laminectomies/decompression.  PORTABLE SPINE - 1 VIEW  Comparison: Earlier same date.  Findings: Cross-table lateral view labeled #2 at 1541 hours. There is an instrument overlapping the L4 spinous process.  A blunt surgical instrument is directed towards the L4-L5 facet joints.  IMPRESSION: Intraoperative localization as described.    Original Report Authenticated By: Carey Bullocks, M.D.   Dg Spine Portable 1 View  03/17/2013   *RADIOLOGY REPORT*  Clinical Data: L4-L5 lumbar laminectomy/decompression.  PORTABLE SPINE - 1 VIEW  Comparison: Lumbar spine radiographs 03/16/2013.  Findings: Cross-table lateral view labeled #1 at 1528 hours demonstrates posterior localizing needles overlying the L4 and L5 spinous processes.  IMPRESSION: Intraoperative localization as described.   Original Report Authenticated By: Carey Bullocks, M.D.    EKG: Orders placed during the hospital encounter of 03/17/13  . EKG     Hospital Course: [name] is a 67 y.o. who was admitted to [facility]. They were brought to the operating room on 03/17/2013 and underwent Procedure(s): LUMBAR LAMINECTOMY/DECOMPRESSION IF RECESS STENOSIS FORAMINOTOMIE OF L4 ROOT AND L5,MICRODISECTOMY  L4-5 ON LEFT.  Patient tolerated the procedure well and was later transferred to the recovery room and then to the orthopaedic floor for postoperative care.  They were given PO and IV analgesics for pain control following their surgery.  They were given 24 hours of postoperative antibiotics of  Anti-infectives   Start     Dose/Rate Route Frequency Ordered Stop   03/18/13 0600  ceFAZolin (ANCEF) IVPB 2 g/50 mL premix  Status:  Discontinued     2 g 100 mL/hr over 30 Minutes Intravenous On call to O.R. 03/17/13 1217 03/17/13 1218   03/17/13 2200  ceFAZolin (ANCEF) IVPB 1 g/50 mL premix  Status:  Discontinued     1 g 100 mL/hr over 30 Minutes Intravenous 3 times per day 03/17/13 1850 03/18/13 1237   03/17/13 1537  polymyxin B 500,000 Units, bacitracin 50,000 Units in sodium chloride irrigation 0.9 % 500 mL irrigation  Status:  Discontinued       As needed 03/17/13 1538 03/17/13 1706   03/17/13 1230  ceFAZolin (ANCEF) IVPB 2 g/50 mL premix     2 g 100 mL/hr over 30 Minutes Intravenous On call to O.R. 03/17/13 1218 03/17/13 1518     and started on DVT prophylaxis  in the form of Aspirin.   PTwas ordered for ambulation assistance.  Discharge planning consulted to help with postop disposition and equipment needs.  Patient had a decent night on the evening of surgery.  They started to get up OOB with therapy on day one. Patient was seen in rounds and was ready to go home.   Discharge Medications: Prior to Admission medications   Medication Sig Start Date End Date Taking? Authorizing Provider  amitriptyline (ELAVIL) 100 MG tablet Take 100 mg by mouth 3 (three) times daily.   Yes Historical Provider, MD  Ascorbic Acid (VITAMIN C WITH ROSE HIPS) 1000 MG tablet Take 1,000 mg by mouth daily.   Yes Historical Provider, MD  Biotin 5000 MCG TABS Take 1 tablet by mouth daily.   Yes Historical Provider, MD  Cholecalciferol (VITAMIN D) 1000 UNITS capsule Take 1,000 Units by mouth daily.   Yes Historical Provider, MD  diphenhydramine-acetaminophen (TYLENOL PM) 25-500 MG TABS Take 1 tablet by mouth at bedtime.    Yes Historical Provider, MD  etodolac (LODINE) 500 MG tablet Take 500 mg by mouth 2 (two) times daily.   Yes Historical Provider, MD  HYDROmorphone (DILAUDID) 2 MG tablet Take 2 mg by mouth every 4 (four) hours as needed for pain.   Yes Historical Provider, MD  ibuprofen (ADVIL,MOTRIN) 200 MG tablet Take 600 mg by mouth every 6 (six) hours as needed for pain.    Yes Historical Provider, MD  levothyroxine (SYNTHROID, LEVOTHROID) 75 MCG tablet Take 75 mcg by mouth daily before breakfast.   Yes Historical Provider, MD  Multiple Vitamins-Minerals (MULTIVITAMIN WITH MINERALS) tablet Take  1 tablet by mouth daily.   Yes Historical Provider, MD  polyethylene glycol (MIRALAX / GLYCOLAX) packet Take 17 g by mouth daily as needed (for constipation).    Yes Historical Provider, MD  vitamin E (VITAMIN E) 400 UNIT capsule Take 400 Units by mouth daily.   Yes Historical Provider, MD    Diet: Regular diet Activity:WBAT Follow-up:in 2 weeks Disposition - Home Discharged  Condition: stable   Discharge Orders   Future Appointments Provider Department Dept Phone   07/26/2013 8:15 AM [facility] [place] [phone]   08/02/2013 10:00 AM Madelin Headings, MD [facility] [place] [phone]   Future Orders Complete By Expires     Call MD / Call 911  As directed     Comments:      If you experience chest pain or shortness of breath, CALL 911 and be transported to the hospital emergency room.  If you develope a fever above 101 F, pus (white drainage) or increased drainage or redness at the wound, or calf pain, call your surgeon's office.    Constipation Prevention  As directed     Comments:      Drink plenty of fluids.  Prune juice may be helpful.  You may use a stool softener, such as Colace (over the counter) 100 mg twice a day.  Use MiraLax (over the counter) for constipation as needed.    Diet general  As directed     Discharge instructions  As directed     Comments:      Change your dressing daily. Shower only, no tub bath. Call if any temperatures greater than 101 or any wound complications: [phone] during the day and ask for Dr. Jeannetta Ellis nurse, Mackey [place].    Driving restrictions  As directed     Comments:      No driving for 2 weeks    Increase activity slowly as tolerated  As directed     Lifting restrictions  As directed     Comments:      No lifting        Medication List         amitriptyline 100 MG tablet  Commonly known as:  ELAVIL  Take 100 mg by mouth 3 (three) times daily.     Biotin 5000 MCG Tabs  Take 1 tablet by mouth daily.     diphenhydramine-acetaminophen 25-500 MG Tabs  Commonly known as:  TYLENOL PM  Take 1 tablet by mouth at bedtime.     etodolac 500 MG tablet  Commonly known as:  LODINE  Take 500 mg by mouth 2 (two) times daily.     HYDROmorphone 2 MG tablet  Commonly known as:  DILAUDID  Take 2 mg by mouth every 4 (four) hours as needed for pain.     ibuprofen 200 MG  tablet  Commonly known as:  ADVIL,MOTRIN  Take 600 mg by mouth every 6 (six) hours as needed for pain.     levothyroxine 75 MCG tablet  Commonly known as:  SYNTHROID, LEVOTHROID  Take 75 mcg by mouth daily before breakfast.     multivitamin with minerals tablet  Take 1 tablet by mouth daily.     polyethylene glycol packet  Commonly known as:  MIRALAX / GLYCOLAX  Take 17 g by mouth daily as needed (for constipation).     vitamin C with rose hips 1000 MG tablet  Take 1,000 mg by mouth daily.     Vitamin  D 1000 UNITS capsule  Take 1,000 Units by mouth daily.     vitamin E 400 UNIT capsule  Generic drug:  vitamin E  Take 400 Units by mouth daily.           Follow-up Information   Follow up with GIOFFRE,RONALD A, MD. Schedule an appointment as soon as possible for a visit in 2 weeks.   Contact information:   [address] [place] [address] [phone]       Signed: Kerby Nora 03/20/2013, 8:54 AM

## 2013-04-23 ENCOUNTER — Ambulatory Visit (INDEPENDENT_AMBULATORY_CARE_PROVIDER_SITE_OTHER): Payer: Medicare Other | Admitting: Family Medicine

## 2013-04-23 VITALS — BP 118/58 | HR 96 | Temp 98.3°F | Resp 18 | Ht 61.0 in | Wt 110.6 lb

## 2013-04-23 DIAGNOSIS — E860 Dehydration: Secondary | ICD-10-CM

## 2013-04-23 DIAGNOSIS — R110 Nausea: Secondary | ICD-10-CM

## 2013-04-23 LAB — POCT CBC
Granulocyte percent: 81 %G — AB (ref 37–80)
HCT, POC: 37 % — AB (ref 37.7–47.9)
Hemoglobin: 11.7 g/dL — AB (ref 12.2–16.2)
Lymph, poc: 1.9 (ref 0.6–3.4)
MCH, POC: 31.4 pg — AB (ref 27–31.2)
MCHC: 31.6 g/dL — AB (ref 31.8–35.4)
MCV: 99.2 fL — AB (ref 80–97)
MID (cbc): 0.8 (ref 0–0.9)
MPV: 9.6 fL (ref 0–99.8)
POC Granulocyte: 11.4 — AB (ref 2–6.9)
POC LYMPH PERCENT: 13.6 %L (ref 10–50)
POC MID %: 5.4 %M (ref 0–12)
Platelet Count, POC: 287 10*3/uL (ref 142–424)
RBC: 3.73 M/uL — AB (ref 4.04–5.48)
RDW, POC: 12.3 %
WBC: 14.1 10*3/uL — AB (ref 4.6–10.2)

## 2013-04-23 LAB — POCT URINALYSIS DIPSTICK
Blood, UA: NEGATIVE
Glucose, UA: NEGATIVE
Ketones, UA: NEGATIVE
Leukocytes, UA: NEGATIVE
Nitrite, UA: NEGATIVE
Protein, UA: NEGATIVE
Spec Grav, UA: 1.01
Urobilinogen, UA: 0.2
pH, UA: 7.5

## 2013-04-23 MED ORDER — ONDANSETRON HCL 8 MG PO TABS: 8.0000 mg | ORAL_TABLET | Freq: Three times a day (TID) | ORAL | Status: DC | PRN

## 2013-04-23 MED ORDER — ONDANSETRON 4 MG PO TBDP
4.0000 mg | ORAL_TABLET | Freq: Once | ORAL | Status: AC
  Administered 2013-04-23: 4 mg via ORAL

## 2013-04-23 NOTE — Progress Notes (Signed)
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 She's had no abdominal pain or fever but she did stop her hydromorphone abruptly 5 days ago. The onset on Thursday was associated with diarrhea. He's having no significant problems with back pain, but after vomiting the first day she did notice some abdominal muscle soreness. She continues to be nauseated but the diarrhea has cleared. Patient's concern that she may be having withdrawal from hydromorphone.  Objective: Patient is alert, in no distress, and pale. I checked her blood pressure standing and sitting it was 100/60 seated and 90/60 standing. Pulse was approximately 100 standing and sitting. There is no icterus HEENT: Unremarkable Skin: Well-healed surgical scar in lumbar area without erythema or drainage Chest: Clear Heart: Rapid, regular without murmur or gallop Abdomen: Soft nontender without HSM or mass Results for orders placed during the hospital encounter of 03/16/13  SURGICAL PCR SCREEN      Result Value Range   MRSA, PCR NEGATIVE  NEGATIVE   Staphylococcus aureus POSITIVE (*) NEGATIVE  CBC      Result Value Range   WBC 7.8  4.0 - 10.5 K/uL   RBC 3.72 (*) 3.87 - 5.11 MIL/uL   Hemoglobin 12.0  12.0 - 15.0 g/dL   HCT 16.1 (*) 09.6 - 04.5 %   MCV 94.4  78.0 - 100.0 fL   MCH 32.3  26.0 - 34.0 pg   MCHC 34.2  30.0 - 36.0 g/dL   RDW 40.9  81.1 - 91.4 %   Platelets 288  150 - 400 K/uL  APTT      Result Value Range   aPTT 30  24 - 37 seconds  COMPREHENSIVE METABOLIC PANEL      Result Value Range   Sodium 130 (*) 135 - 145 mEq/L   Potassium 4.1  3.5 - 5.1 mEq/L   Chloride 95 (*) 96 - 112 mEq/L   CO2 29  19 - 32 mEq/L   Glucose, Bld 73  70 - 99 mg/dL   BUN 8  6 - 23 mg/dL   Creatinine, Ser 7.82  0.50 - 1.10 mg/dL   Calcium 9.6  8.4 - 95.6 mg/dL   Total Protein 7.0  6.0 - 8.3 g/dL   Albumin 3.5  3.5 -  5.2 g/dL   AST 14  0 - 37 U/L   ALT 6  0 - 35 U/L   Alkaline Phosphatase 76  39 - 117 U/L   Total Bilirubin 0.2 (*) 0.3 - 1.2 mg/dL   GFR calc non Af Amer 88 (*) >90 mL/min   GFR calc Af Amer >90  >90 mL/min  PROTIME-INR      Result Value Range   Prothrombin Time 12.1  11.6 - 15.2 seconds   INR 0.91  0.00 - 1.49  URINALYSIS, ROUTINE W REFLEX MICROSCOPIC      Result Value Range   Color, Urine YELLOW  YELLOW   APPearance CLEAR  CLEAR   Specific Gravity, Urine 1.012  1.005 - 1.030   pH 7.0  5.0 - 8.0   Glucose, UA NEGATIVE  NEGATIVE mg/dL   Hgb urine dipstick NEGATIVE  NEGATIVE   Bilirubin Urine MODERATE (*) NEGATIVE   Ketones, ur NEGATIVE  NEGATIVE mg/dL   Protein, ur NEGATIVE  NEGATIVE mg/dL   Urobilinogen, UA 0.2  0.0 - 1.0 mg/dL   Nitrite NEGATIVE  NEGATIVE  Leukocytes, UA NEGATIVE  NEGATIVE   patient appeared to improve significantly after 2 L of fluid Nausea - Plan: ondansetron (ZOFRAN-ODT) disintegrating tablet 4 mg, POCT CBC, ondansetron (ZOFRAN) 8 MG tablet Assessment: Patient seems to had some dehydration following abrupt cessation of Dilaudid. She seemed to improve after fluid resuscitation. Patient was seen and of observed acutely over 2 hours during her stay here in the clinic.   Signed, Elvina Sidle, MD

## 2013-04-29 ENCOUNTER — Encounter: Payer: Self-pay | Admitting: Family Medicine

## 2013-04-29 ENCOUNTER — Ambulatory Visit (INDEPENDENT_AMBULATORY_CARE_PROVIDER_SITE_OTHER): Payer: Medicare Other | Admitting: Family Medicine

## 2013-04-29 VITALS — BP 144/88 | HR 99 | Temp 99.0°F | Resp 16 | Ht 61.5 in | Wt 111.0 lb

## 2013-04-29 DIAGNOSIS — J209 Acute bronchitis, unspecified: Secondary | ICD-10-CM

## 2013-04-29 MED ORDER — AMOXICILLIN-POT CLAVULANATE 875-125 MG PO TABS: 1.0000 | ORAL_TABLET | Freq: Two times a day (BID) | ORAL | Status: DC

## 2013-04-29 MED ORDER — HYDROCOD POLST-CHLORPHEN POLST 10-8 MG/5ML PO LQCR: 5.0000 mL | Freq: Two times a day (BID) | ORAL | Status: DC | PRN

## 2013-04-29 NOTE — Progress Notes (Signed)
     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   The cough has become productive but not associated with any fever. Patient had no chest pain or shortness of breath.  Objective: Few rhonchi and rales in the left base Throat is mildly erythematous Neck: Supple no adenopathy Heart: Regular no murmur Extremities: No edema  Assessment: Bronchitis without respiratory compromise. Patient knows she needs to quit smoking Plan:Acute bronchitis - Plan: amoxicillin-clavulanate (AUGMENTIN) 875-125 MG per tablet, chlorpheniramine-HYDROcodone (TUSSIONEX PENNKINETIC ER) 10-8 MG/5ML LQCR  Signed, Elvina Sidle, MD

## 2013-04-29 NOTE — Patient Instructions (Signed)

## 2013-06-03 ENCOUNTER — Ambulatory Visit (INDEPENDENT_AMBULATORY_CARE_PROVIDER_SITE_OTHER): Payer: Medicare Other | Admitting: Emergency Medicine

## 2013-06-03 VITALS — BP 118/74 | HR 100 | Temp 98.1°F | Resp 20 | Ht 61.5 in | Wt 111.2 lb

## 2013-06-03 DIAGNOSIS — Z23 Encounter for immunization: Secondary | ICD-10-CM

## 2013-06-03 DIAGNOSIS — J209 Acute bronchitis, unspecified: Secondary | ICD-10-CM

## 2013-06-03 MED ORDER — AMOXICILLIN-POT CLAVULANATE 875-125 MG PO TABS: 1.0000 | ORAL_TABLET | Freq: Two times a day (BID) | ORAL | Status: DC

## 2013-06-03 MED ORDER — OFLOXACIN 0.3 % OP SOLN: 1.0000 [drp] | Freq: Four times a day (QID) | OPHTHALMIC | Status: DC

## 2013-06-03 NOTE — Progress Notes (Signed)
[facility] [address], [place] [address] [phone]  Date:  06/03/2013   Name:  [name]   DOB:  1945-12-16   MRN:  [id number]  PCP:  Lorretta Harp, MD    Chief Complaint: Eye Drainage   History of Present Illness:  [name] is a 68 y.o. very pleasant female patient who presents with the following:  Ill with nasal congestion and post nasal drip.  Fatigued.  Has a cough productive green sputum.  No wheezing or shortness of breath.  Ill all week.  No fever or chills.  Now has bilateral red, draining eyes.  No sick contacts.  No improvement with over the counter medications or other home remedies. Denies other complaint or health concern today.   Patient Active Problem List   Diagnosis Date Noted  . Spinal stenosis, lumbar region, with neurogenic claudication 03/17/2013  . Herniated lumbar intervertebral disc 03/17/2013  . Wax in ear 07/19/2011  . Decreased hearing 07/19/2011  . UTI (lower urinary tract infection) 07/19/2011  . UTI 06/30/2010  . TOBACCO USE, QUIT 06/24/2009  . ELEVATED BLOOD PRESSURE WITHOUT DIAGNOSIS OF HYPERTENSION 04/11/2009  . RHINITIS, CHRONIC 06/25/2008  . ACTINIC KERATOSIS, HEAD 06/25/2008  . HEARTBURN 09/12/2007  . LEG PAIN 06/27/2007  . OSTEOPOROSIS 06/27/2007  . UNSPECIFIED SLEEP DISTURBANCE 06/27/2007  . NUMBNESS 06/27/2007  . Edema 06/27/2007  . FASTING HYPERGLYCEMIA 06/27/2007  . HYPOTHYROIDISM 04/21/2007  . ROSACEA 04/21/2007  . OSTEOARTHRITIS 04/21/2007    Past Medical History  Diagnosis Date  . Hypothyroidism   . Rosacea   . GERD (gastroesophageal reflux disease)     WOULD USE OTC MEDICATION IF NEEDED  . 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 Tachycardia     PT STATES HER HEART RATE USUALLY ABOVE 100    Past Surgical History  Procedure Laterality Date  . Tonsillectomy  1953  . Breast biopsy      bilateral benign  . Bunionectomy  2003    bilateral  . Lumbar  laminectomy/decompression microdiscectomy Left 03/17/2013    Procedure: LUMBAR LAMINECTOMY/DECOMPRESSION IF RECESS STENOSIS FORAMINOTOMIE OF L4 ROOT AND L5,MICRODISECTOMY  L4-5 ON LEFT;  Surgeon: Jacki Cones, MD;  Location: [facility];  Service: Orthopedics;  Laterality: Left;    History  Substance Use Topics  . Smoking status: Former Smoker -- 1.00 packs/day for 40 years    Types: Cigarettes    Quit date: 06/03/2012  . Smokeless tobacco: Never Used  . Alcohol Use: Yes    Family History  Problem Relation Age of Onset  . Heart disease    . Pancreatic cancer Brother     No Known Allergies  Medication list has been reviewed and updated.  Current Outpatient Prescriptions on File Prior to Visit  Medication Sig Dispense Refill  . amitriptyline (ELAVIL) 100 MG tablet Take 100 mg by mouth 3 (three) times daily.      . Ascorbic Acid (VITAMIN C WITH ROSE HIPS) 1000 MG tablet Take 1,000 mg by mouth daily.      . Biotin 5000 MCG TABS Take 1 tablet by mouth daily.      . diphenhydramine-acetaminophen (TYLENOL PM) 25-500 MG TABS Take 1 tablet by mouth at bedtime.       Marland Kitchen levothyroxine (SYNTHROID, LEVOTHROID) 75 MCG tablet Take 75 mcg by mouth daily before breakfast.      . Multiple Vitamins-Minerals (MULTIVITAMIN WITH MINERALS) tablet Take 1 tablet by mouth daily.      Marland Kitchen  polyethylene glycol (MIRALAX / GLYCOLAX) packet Take 17 g by mouth daily as needed (for constipation).       . vitamin E (VITAMIN E) 400 UNIT capsule Take 400 Units by mouth daily.      Marland Kitchen amoxicillin-clavulanate (AUGMENTIN) 875-125 MG per tablet Take 1 tablet by mouth 2 (two) times daily.  20 tablet  0  . chlorpheniramine-HYDROcodone (TUSSIONEX PENNKINETIC ER) 10-8 MG/5ML LQCR Take 5 mLs by mouth every 12 (twelve) hours as needed.  90 mL  0  . Cholecalciferol (VITAMIN D) 1000 UNITS capsule Take 1,000 Units by mouth daily.      Marland Kitchen etodolac (LODINE) 500 MG tablet Take 500 mg by mouth 2 (two) times daily.      Marland Kitchen HYDROmorphone  (DILAUDID) 2 MG tablet Take 2 mg by mouth every 4 (four) hours as needed for pain.      Marland Kitchen ibuprofen (ADVIL,MOTRIN) 200 MG tablet Take 600 mg by mouth every 6 (six) hours as needed for pain.       Marland Kitchen ondansetron (ZOFRAN) 8 MG tablet Take 1 tablet (8 mg total) by mouth every 8 (eight) hours as needed for nausea.  8 tablet  0   No current facility-administered medications on file prior to visit.    Review of Systems:  As per HPI, otherwise negative.    Physical Examination: Filed Vitals:   06/03/13 1024  BP: 118/74  Pulse: 100  Temp: 98.1 F (36.7 C)  Resp: 20   Filed Vitals:   06/03/13 1024  Height: 5' 1.5" (1.562 m)  Weight: 111 lb 3.2 oz (50.44 kg)   Body mass index is 20.67 kg/(m^2). Ideal Body Weight: Weight in (lb) to have BMI = 25: 134.2  GEN: WDWN, NAD, Non-toxic, A & O x 3 HEENT: Atraumatic, Normocephalic. Neck supple. No masses, No LAD.  Mild conjunctival injection with exudate Ears and Nose: No external deformity.  Bilateral cerumen impaction.   CV: RRR, No M/G/R. No JVD. No thrill. No extra heart sounds. PULM: CTA B, no wheezes, crackles, rhonchi. No retractions. No resp. distress. No accessory muscle use. ABD: S, NT, ND, +BS. No rebound. No HSM. EXTR: No c/c/e NEURO Normal gait.  PSYCH: Normally interactive. Conversant. Not depressed or anxious appearing.  Calm demeanor.    Assessment and Plan: Sinusitis  Bronchitis Conjunctivitis Ocuflox augmentin  Signed,  Phillips Odor, MD

## 2013-06-03 NOTE — Patient Instructions (Signed)

## 2013-06-08 ENCOUNTER — Other Ambulatory Visit: Payer: Self-pay | Admitting: Internal Medicine

## 2013-07-26 ENCOUNTER — Other Ambulatory Visit (INDEPENDENT_AMBULATORY_CARE_PROVIDER_SITE_OTHER): Payer: Medicare Other

## 2013-07-26 DIAGNOSIS — E039 Hypothyroidism, unspecified: Secondary | ICD-10-CM

## 2013-07-26 DIAGNOSIS — Z0000 Encounter for general adult medical examination without abnormal findings: Secondary | ICD-10-CM

## 2013-07-26 LAB — CBC WITH DIFFERENTIAL/PLATELET
Basophils Absolute: 0 10*3/uL (ref 0.0–0.1)
Basophils Relative: 0.6 % (ref 0.0–3.0)
Eosinophils Absolute: 0.3 10*3/uL (ref 0.0–0.7)
Eosinophils Relative: 4.9 % (ref 0.0–5.0)
HCT: 38.1 % (ref 36.0–46.0)
Hemoglobin: 12.7 g/dL (ref 12.0–15.0)
Lymphocytes Relative: 28.5 % (ref 12.0–46.0)
Lymphs Abs: 1.9 10*3/uL (ref 0.7–4.0)
MCHC: 33.4 g/dL (ref 30.0–36.0)
MCV: 96.8 fl (ref 78.0–100.0)
Monocytes Absolute: 0.4 10*3/uL (ref 0.1–1.0)
Monocytes Relative: 6.3 % (ref 3.0–12.0)
Neutro Abs: 3.9 10*3/uL (ref 1.4–7.7)
Neutrophils Relative %: 59.7 % (ref 43.0–77.0)
Platelets: 276 10*3/uL (ref 150.0–400.0)
RBC: 3.94 Mil/uL (ref 3.87–5.11)
RDW: 14.6 % (ref 11.5–14.6)
WBC: 6.5 10*3/uL (ref 4.5–10.5)

## 2013-07-26 LAB — BASIC METABOLIC PANEL WITH GFR
BUN: 9 mg/dL (ref 6–23)
CO2: 26 meq/L (ref 19–32)
Calcium: 9 mg/dL (ref 8.4–10.5)
Chloride: 106 meq/L (ref 96–112)
Creatinine, Ser: 0.8 mg/dL (ref 0.4–1.2)
GFR: 75.2 mL/min
Glucose, Bld: 87 mg/dL (ref 70–99)
Potassium: 3.4 meq/L — ABNORMAL LOW (ref 3.5–5.1)
Sodium: 139 meq/L (ref 135–145)

## 2013-07-26 LAB — POCT URINALYSIS DIPSTICK
Blood, UA: NEGATIVE
Glucose, UA: NEGATIVE
Ketones, UA: NEGATIVE
Nitrite, UA: NEGATIVE
Protein, UA: NEGATIVE
Spec Grav, UA: 1.015
Urobilinogen, UA: 0.2
pH, UA: 7

## 2013-07-26 LAB — LIPID PANEL
Cholesterol: 197 mg/dL (ref 0–200)
HDL: 56.2 mg/dL
LDL Cholesterol: 122 mg/dL — ABNORMAL HIGH (ref 0–99)
Total CHOL/HDL Ratio: 4
Triglycerides: 94 mg/dL (ref 0.0–149.0)
VLDL: 18.8 mg/dL (ref 0.0–40.0)

## 2013-07-26 LAB — HEPATIC FUNCTION PANEL
ALT: 11 U/L (ref 0–35)
AST: 12 U/L (ref 0–37)
Albumin: 3.5 g/dL (ref 3.5–5.2)
Alkaline Phosphatase: 60 U/L (ref 39–117)
Bilirubin, Direct: 0 mg/dL (ref 0.0–0.3)
Total Bilirubin: 0.4 mg/dL (ref 0.3–1.2)
Total Protein: 6.6 g/dL (ref 6.0–8.3)

## 2013-07-26 LAB — TSH: TSH: 0.79 u[IU]/mL (ref 0.35–5.50)

## 2013-08-02 ENCOUNTER — Ambulatory Visit (INDEPENDENT_AMBULATORY_CARE_PROVIDER_SITE_OTHER): Payer: Medicare Other | Admitting: Internal Medicine

## 2013-08-02 ENCOUNTER — Encounter: Payer: Self-pay | Admitting: Internal Medicine

## 2013-08-02 VITALS — BP 136/100 | HR 86 | Temp 98.3°F | Ht 62.0 in | Wt 110.0 lb

## 2013-08-02 DIAGNOSIS — Z9889 Other specified postprocedural states: Secondary | ICD-10-CM

## 2013-08-02 DIAGNOSIS — F4323 Adjustment disorder with mixed anxiety and depressed mood: Secondary | ICD-10-CM

## 2013-08-02 DIAGNOSIS — R030 Elevated blood-pressure reading, without diagnosis of hypertension: Secondary | ICD-10-CM

## 2013-08-02 DIAGNOSIS — Z0000 Encounter for general adult medical examination without abnormal findings: Secondary | ICD-10-CM

## 2013-08-02 DIAGNOSIS — E039 Hypothyroidism, unspecified: Secondary | ICD-10-CM

## 2013-08-02 DIAGNOSIS — IMO0002 Reserved for concepts with insufficient information to code with codable children: Secondary | ICD-10-CM

## 2013-08-02 DIAGNOSIS — M5410 Radiculopathy, site unspecified: Secondary | ICD-10-CM

## 2013-08-02 MED ORDER — AMITRIPTYLINE HCL 100 MG PO TABS: ORAL_TABLET | ORAL | Status: DC

## 2013-08-02 MED ORDER — SYNTHROID 75 MCG PO TABS: ORAL_TABLET | ORAL | Status: DC

## 2013-08-02 NOTE — Patient Instructions (Signed)
Check into the cost  To you of the shingles vaccine and you can receive this at any time Stop tobacco.  Make appt with dr Ethelene Hal   About the pain.  consider change elavil to cymbalta a no guarantee that it will help sleep. . And depression . Will  Increase potassium rich foods  In the meantime   You blood pressusre if elevated today . Check readings 3 x per week and ROV in 1-2 months with readings  . Same dose of  synthroiufd at this time. Stop tobacco .

## 2013-08-02 NOTE — Progress Notes (Signed)
Chief Complaint  Patient presents with  . Medicare Wellness    pain back thyroid etc     HPI: Patient comes in today for Preventive Medicare wellness visit . No major injuries, ed visits ,hospitalizations , new medications since last visit.   Since last visit she has had a number of visits to urgen care for "bronchitis" and eye infcation given augmentin both times   Had surgery august  laminectamy for spinal stenosis  And had  [place]y wd of medication  Pain med WD.   Now back to work. But is in chronic pain. This appointment at the level she is having Burns and wakes up for night. With left lower leg pain  Pain  Causing pain .   Sen ortho end of October. Doesn't have her routine followup appointment yet.  Pain 5- 9 depending on time  Thyroid no change in medicine currently on brand. May want to switch to generic at sometime in the future.  Taking Lodine twice a day uncertain if it helps.  Taking vitamin E. and vitamin C because someone told her it was good. To do that.  Taking biotin for hair and nails.   Hearing: ok  Vision:  No limitations at present . Last eye check UTD  Safety:  Has smoke detector and wears seat belts.  No firearms. No excess sun exposure. Sees dentist regularly.  Falls:  No   Advance directive :  Reviewed   Memory: Felt to be good  , no concern from her or her family.  Depression: No anhedonia unusual crying or depressive symptoms see screen  Nutrition: Eats well balanced diet; adequate calcium and vitamin D. No swallowing chewing problems. Lost  10 pounds since surgery   Injury: no major injuries in the last six months.  Other healthcare providers:  Reviewed today . Ortho    Social:  Lives with spouse married. 2 pets   Preventive parameters: up-to-date  Reviewed   ADLS:   There are no problems or need for assistance  driving, feeding, obtaining food, dressing, toileting and bathing, managing money using phone. She is independent.  EXERCISE/  HABITS  Per week   No rehab. Restarted  tobacco  10 day    etoh   no Health Maintenance  Topic Date Due  . Zostavax  08/08/2007  . Mammogram  07/14/2011  . Influenza Vaccine  03/24/2014  . Tetanus/tdap  05/07/2015  . Colonoscopy  09/27/2022  . Pneumococcal Polysaccharide Vaccine Age 16 And Over  Completed   Health Maintenance Review    ROS:  GEN/ HEENT: No fever, significant weight changes sweats headaches vision problems hearing changes, did lose 10 pounds related to the surgery CV/ PULM; No chest pain shortness of breath cough, syncope,edema  change in exercise tolerance. GI /GU: No adominal pain, vomiting, change in bowel habits. No blood in the stool. No significant GU symptoms. SKIN/HEME: ,no acute skin rashes suspicious lesions or bleeding. No lymphadenopathy, nodules, masses.  NEURO/ PSYCH:  No new neurologic signs such as weakness numbness.   IMM/ Allergy: No unusual infections.  Allergy .   REST of 12 system review negative except as per HPI   Past Medical History  Diagnosis Date  . Hypothyroidism   . Rosacea   . GERD (gastroesophageal reflux disease)     WOULD USE OTC MEDICATION IF NEEDED  . 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 Tachycardia     PT STATES HER HEART RATE USUALLY ABOVE  100    Family History  Problem Relation Age of Onset  . Heart disease    . Pancreatic cancer Brother     History   Social History  . Marital Status: Married    Spouse Name: N/A    Number of Children: N/A  . Years of Education: N/A   Social History Main Topics  . Smoking status: Former Smoker -- 1.00 packs/day for 40 years    Types: Cigarettes    Quit date: 06/03/2012  . Smokeless tobacco: Never Used     Comment: back to tobacco 10 per day   . Alcohol Use: Yes  . Drug Use: No  . Sexual Activity: None   Other Topics Concern  . 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  [place]   [facility] OF 2son getting a divorce    Married.    No health insurance self employed  Now on medicare    2 dogs     Outpatient Encounter Prescriptions as of 08/02/2013  Medication Sig  . amitriptyline (ELAVIL) 100 MG tablet TAKE 3 TABLETS BY MOUTH EVERY day  . Ascorbic Acid (VITAMIN C WITH ROSE HIPS) 1000 MG tablet Take 1,000 mg by mouth daily.  . Biotin 5000 MCG TABS Take 1 tablet by mouth daily.  . Cholecalciferol (VITAMIN D) 1000 UNITS capsule Take 1,000 Units by mouth daily.  . diphenhydramine-acetaminophen (TYLENOL PM) 25-500 MG TABS Take 1 tablet by mouth at bedtime.   Marland Kitchen etodolac (LODINE) 500 MG tablet Take 500 mg by mouth 2 (two) times daily.  Marland Kitchen gabapentin (NEURONTIN) 300 MG capsule Take 300 mg by mouth daily.  Marland Kitchen HYDROcodone-acetaminophen (NORCO/VICODIN) 5-325 MG per tablet   . Multiple Vitamins-Minerals (MULTIVITAMIN WITH MINERALS) tablet Take 1 tablet by mouth daily.  . polyethylene glycol (MIRALAX / GLYCOLAX) packet Take 17 g by mouth daily as needed (for constipation).   . SYNTHROID 75 MCG tablet TAKE 1 TABLET BY MOUTH EVERY DAY  . vitamin E (VITAMIN E) 400 UNIT capsule Take 400 Units by mouth daily.  . [DISCONTINUED] amitriptyline (ELAVIL) 100 MG tablet Take 100 mg by mouth 3 (three) times daily.  . [DISCONTINUED] amitriptyline (ELAVIL) 100 MG tablet TAKE 3 TABLETS BY MOUTH EVERY DAY  . [DISCONTINUED] ibuprofen (ADVIL,MOTRIN) 200 MG tablet Take 600 mg by mouth every 6 (six) hours as needed for pain.   . [DISCONTINUED] levothyroxine (SYNTHROID, LEVOTHROID) 75 MCG tablet Take 75 mcg by mouth daily before breakfast.  . [DISCONTINUED] SYNTHROID 75 MCG tablet TAKE 1 TABLET BY MOUTH EVERY DAY  . [DISCONTINUED] amoxicillin-clavulanate (AUGMENTIN) 875-125 MG per tablet Take 1 tablet by mouth 2 (two) times daily.  . [DISCONTINUED] chlorpheniramine-HYDROcodone (TUSSIONEX PENNKINETIC ER) 10-8 MG/5ML LQCR Take 5 mLs by mouth every 12 (twelve) hours as needed.  . [DISCONTINUED]  HYDROmorphone (DILAUDID) 2 MG tablet Take 2 mg by mouth every 4 (four) hours as needed for pain.  . [DISCONTINUED] ofloxacin (OCUFLOX) 0.3 % ophthalmic solution Place 1 drop into both eyes 4 (four) times daily.  . [DISCONTINUED] ondansetron (ZOFRAN) 8 MG tablet Take 1 tablet (8 mg total) by mouth every 8 (eight) hours as needed for nausea.    EXAM:  BP 136/100  Pulse 86  Temp(Src) 98.3 F (36.8 C) (Oral)  Ht 5\' 2"  (1.575 m)  Wt 110 lb (49.896 kg)  BMI 20.11 kg/m2  SpO2 99%  Body mass index  is 20.11 kg/(m^2).  Physical Exam: Vital signs reviewed 145/96 bp left XBJ:YNWG is a well-developed well-nourished alert cooperative   who appears stated age in no acute distress.  HEENT: normocephalic atraumatic , Eyes: PERRL EOM's full, conjunctiva clear, Nares: paten,t no deformity discharge or tenderness., Ears: no deformity EAC's clear TMs with normal landmarks. Mouth: clear OP, no lesions, edema.  Moist mucous membranes. Dentition in adequate repair. NECK: supple without masses, thyromegaly or bruits. CHEST/PULM:  Clear to auscultation and percussion breath sounds equal no wheeze , rales or rhonchi. No chest wall deformities or tenderness. Breast: normal by inspection . No dimpling, discharge, masses, tenderness or discharge . CV: PMI is nondisplaced, S1 S2 no gallops, murmurs, rubs. Peripheral pulses are full without delay.No JVD .  ABDOMEN: Bowel sounds normal nontender  No guard or rebound, no hepato splenomegal no CVA tenderness.  No hernia. Extremtities:  No clubbing cyanosis or edema, no acute joint swelling or redness no focal atrophy NEURO:  Oriented x3, cranial nerves 3-12 appear to be intact, no obvious focal weakness, mildly antalgic can crsoss legs without change in pain  SKIN: No acute rashes normal turgor, color, no bruising or petechiae. PSYCH: Oriented, good eye contact, no obvious depression anxiety, cognition and judgment appear normal. LN: no cervical axillary inguinal  adenopathy No noted deficits in memory, attention, and speech.   Lab Results  Component Value Date   WBC 6.5 07/26/2013   HGB 12.7 07/26/2013   HCT 38.1 07/26/2013   PLT 276.0 07/26/2013   GLUCOSE 87 07/26/2013   CHOL 197 07/26/2013   TRIG 94.0 07/26/2013   HDL 56.20 07/26/2013   LDLDIRECT 132.5 06/23/2010   LDLCALC 122* 07/26/2013   ALT 11 07/26/2013   AST 12 07/26/2013   NA 139 07/26/2013   K 3.4* 07/26/2013   CL 106 07/26/2013   CREATININE 0.8 07/26/2013   BUN 9 07/26/2013   CO2 26 07/26/2013   TSH 0.79 07/26/2013   INR 0.91 03/16/2013   HGBA1C 5.5 06/18/2008    ASSESSMENT AND PLAN:  Discussed the following assessment and plan:  Visit for preventive health examination  Encounter for Medicare annual wellness exam  Elevated blood pressure reading - Uncertain if related to pain confirmed on repeat need readings outside the office followup in one to 2 months decide on adding medication  Radicular pain of left lower extremity - Only taking 300 Neurontin at night has been taking hydrocodone at night from more thorough suggest increase see Dr. Ethelene Hal. is or or so about pain management  History of back surgery - Spinal stenosis summer 2014  Adjustment reaction with anxiety and depression - Symptoms really relate to her pain and interference with sleep. She is on antidepressant doses of Elavil consider switching to Cymbalta  Hypothyroidism Borderline low potassium uncertain cause not on diuretic increased high potassium rich foods. I would like her to go back and see orthopedics or physiatry. Consideration of increasing the Neurontin at night consideration of changing amitriptyline to Cymbalta. I would like her to see physiatry first monitor blood pressures follow her back in 2 months. Any controlled substance regarding to her pain should come from the above offices. Patient Care Team: Madelin Headings, MD as PCP - General  Patient Instructions  Check into the cost  To you of the shingles  vaccine and you can receive this at any time Stop tobacco.  Make appt with dr Ethelene Hal   About the pain.  consider change elavil to cymbalta a no guarantee  that it will help sleep. . And depression . Will  Increase potassium rich foods  In the meantime   You blood pressusre if elevated today . Check readings 3 x per week and ROV in 1-2 months with readings  . Same dose of  synthroiufd at this time. Stop tobacco .       Neta Mends. Elasia Furnish M.D.    Pre visit review using our clinic review tool, if applicable. No additional management support is needed unless otherwise documented below in the visit note.

## 2013-09-02 ENCOUNTER — Other Ambulatory Visit: Payer: Self-pay | Admitting: Internal Medicine

## 2013-10-03 ENCOUNTER — Ambulatory Visit (INDEPENDENT_AMBULATORY_CARE_PROVIDER_SITE_OTHER): Payer: Medicare Other | Admitting: Internal Medicine

## 2013-10-03 ENCOUNTER — Encounter: Payer: Self-pay | Admitting: Internal Medicine

## 2013-10-03 ENCOUNTER — Ambulatory Visit: Payer: Medicare Other | Admitting: Internal Medicine

## 2013-10-03 VITALS — BP 142/86 | HR 89 | Temp 98.4°F | Ht 62.0 in | Wt 107.0 lb

## 2013-10-03 DIAGNOSIS — Z9889 Other specified postprocedural states: Secondary | ICD-10-CM

## 2013-10-03 DIAGNOSIS — M1990 Unspecified osteoarthritis, unspecified site: Secondary | ICD-10-CM

## 2013-10-03 DIAGNOSIS — E039 Hypothyroidism, unspecified: Secondary | ICD-10-CM

## 2013-10-03 DIAGNOSIS — Z79899 Other long term (current) drug therapy: Secondary | ICD-10-CM

## 2013-10-03 DIAGNOSIS — R030 Elevated blood-pressure reading, without diagnosis of hypertension: Secondary | ICD-10-CM

## 2013-10-03 DIAGNOSIS — M48062 Spinal stenosis, lumbar region with neurogenic claudication: Secondary | ICD-10-CM

## 2013-10-03 DIAGNOSIS — G479 Sleep disorder, unspecified: Secondary | ICD-10-CM

## 2013-10-03 DIAGNOSIS — F4323 Adjustment disorder with mixed anxiety and depressed mood: Secondary | ICD-10-CM

## 2013-10-03 MED ORDER — MELOXICAM 15 MG PO TABS: 15.0000 mg | ORAL_TABLET | Freq: Every day | ORAL | Status: DC

## 2013-10-03 MED ORDER — LEVOTHYROXINE SODIUM 75 MCG PO TABS: 75.0000 ug | ORAL_TABLET | Freq: Every day | ORAL | Status: DC

## 2013-10-03 NOTE — Patient Instructions (Addendum)
300 mg  Of amitryptaline  Is maximum dose  . Caution with side effects .  Can review formulary and try changing to  Another less expensive in class  consider  cymbalta gemeric used for depression and anxiety but helps nerve pain in some people.   But may not be as good for. sleep.  Other option increase the gabapentin . Instead . To 2 at night Stop the etodolac and try meloxicam if needed as antiinflammatory.  Continue  Pain management through dr Nelva Bush at this time.  If changing to generic.  Synthroid would check  Lab in 3 months .

## 2013-10-03 NOTE — Progress Notes (Signed)
Chief Complaint  Patient presents with  . Follow-up    HPI: Patient comes in today for follow up of  multiple medical problems.   BP 127 135 range not checking at this time  PAin problematic tolerating using 300 of  elavil  For sleep ? ican she take more? onelavil for a long time  referal to dr Nelva Bush  Taking gabapentin   pen 300 at night . With help.  Pain pill hydrocodone  Only when flares.  Never in day about 3 doses per week.  Formulary has changed in the cost of Elavil is more alternative list given to her by insurance company which includes desipramine and nortriptyline. Also anti-inflammatories meloxicam is less expensive. ROS: See pertinent positives and negatives per HPI. No cp sob syncope no GI bleeding  Past Medical History  Diagnosis Date  . Hypothyroidism   . Rosacea   . GERD (gastroesophageal reflux disease)     WOULD USE OTC MEDICATION IF NEEDED  . 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 Tachycardia     PT STATES HER HEART RATE USUALLY ABOVE 100    Family History  Problem Relation Age of Onset  . Heart disease    . Pancreatic cancer Brother     History   Social History  . Marital Status: Married    Spouse Name: N/A    Number of Children: N/A  . Years of Education: N/A   Social History Main Topics  . Smoking status: Former Smoker -- 1.00 packs/day for 40 years    Types: Cigarettes    Quit date: 06/03/2012  . Smokeless tobacco: Never Used     Comment: back to tobacco 10 per day   . Alcohol Use: Yes  . Drug Use: No  . Sexual Activity: None   Other Topics Concern  . 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    No health insurance self employed  Now on medicare    2 dogs     Outpatient Encounter Prescriptions as of 10/03/2013  Medication Sig  . Ascorbic Acid (VITAMIN C WITH ROSE HIPS) 1000  MG tablet Take 1,000 mg by mouth daily.  . Biotin 5000 MCG TABS Take 1 tablet by mouth daily.  . diphenhydramine-acetaminophen (TYLENOL PM) 25-500 MG TABS Take 1 tablet by mouth at bedtime.   Marland Kitchen HYDROcodone-acetaminophen (NORCO/VICODIN) 5-325 MG per tablet   . levothyroxine (SYNTHROID, LEVOTHROID) 75 MCG tablet Take 1 tablet (75 mcg total) by mouth daily before breakfast.  . Multiple Vitamins-Minerals (MULTIVITAMIN WITH MINERALS) tablet Take 1 tablet by mouth daily.  . polyethylene glycol (MIRALAX / GLYCOLAX) packet Take 17 g by mouth daily as needed (for constipation).   . [DISCONTINUED] amitriptyline (ELAVIL) 100 MG tablet TAKE 3 TABLETS BY MOUTH EVERY day  . [DISCONTINUED] etodolac (LODINE) 500 MG tablet TAKE 1 TABLET BY MOUTH TWICE A DAY  . [DISCONTINUED] levothyroxine (SYNTHROID, LEVOTHROID) 75 MCG tablet Take 75 mcg by mouth daily before breakfast.  . [DISCONTINUED] SYNTHROID 75 MCG tablet TAKE 1 TABLET BY MOUTH EVERY DAY  . meloxicam (MOBIC) 15 MG tablet Take 1 tablet (15 mg total) by mouth daily. As needed for pain  . nortriptyline (PAMELOR) 50 MG capsule Take 3 capsules (150 mg total) by mouth at bedtime.  . [  DISCONTINUED] Cholecalciferol (VITAMIN D) 1000 UNITS capsule Take 1,000 Units by mouth daily.  . [DISCONTINUED] etodolac (LODINE) 500 MG tablet Take 500 mg by mouth 2 (two) times daily.  . [DISCONTINUED] gabapentin (NEURONTIN) 300 MG capsule Take 300 mg by mouth daily.  . [DISCONTINUED] vitamin E (VITAMIN E) 400 UNIT capsule Take 400 Units by mouth daily.    EXAM:  BP 142/86  Pulse 89  Temp(Src) 98.4 F (36.9 C) (Oral)  Ht 5\' 2"  (1.575 m)  Wt 107 lb (48.535 kg)  BMI 19.57 kg/m2  SpO2 97%  Body mass index is 19.57 kg/(m^2). Blood pressure 135/80 GENERAL: vitals reviewed and listed above, alert, oriented, appears well hydrated and in no acute distress HEENT: atraumatic, conjunctiva  clear, no obvious abnormalities on inspection of external nose and ears  NECK: no obvious  masses on inspection palpation LUNGS: clear to auscultation bilaterally, no wheezes, rales or rhonchi, good air movement CV: HRRR, no clubbing cyanosis or  peripheral edema nl cap refill  MS: moves all extremities without noticeable focal  Abnormality has obvious antalgic gait. Favors back but in no acute distress PSYCH: pleasant and cooperative, no obvious depression or anxiety Lab Results  Component Value Date   WBC 6.5 07/26/2013   HGB 12.7 07/26/2013   HCT 38.1 07/26/2013   PLT 276.0 07/26/2013   GLUCOSE 87 07/26/2013   CHOL 197 07/26/2013   TRIG 94.0 07/26/2013   HDL 56.20 07/26/2013   LDLDIRECT 132.5 06/23/2010   LDLCALC 122* 07/26/2013   ALT 11 07/26/2013   AST 12 07/26/2013   NA 139 07/26/2013   K 3.4* 07/26/2013   CL 106 07/26/2013   CREATININE 0.8 07/26/2013   BUN 9 07/26/2013   CO2 26 07/26/2013   TSH 0.79 07/26/2013   INR 0.91 03/16/2013   HGBA1C 5.5 06/18/2008    ASSESSMENT AND PLAN:  Discussed the following assessment and plan:  UNSPECIFIED SLEEP DISTURBANCE - Really a maximum dose of Elavil at risk at her age we'll review her formulary and change medication  Adjustment reaction with anxiety and depression  Elevated blood pressure reading  Medication management  Osteoarthrosis, unspecified whether generalized or localized, unspecified site  Radicular pain of left lower extremity  Spinal stenosis, lumbar region, with neurogenic claudication  History of back surgery  Hypothyroidism Change to Mobic ever record review we switched to nortriptyline 150 mg in into her pharmacy. The multiple may be higher risk than this TCA uncertain at this time Trying to save money switching Synthroid to generic should check TSH in 3 months. If unstable may have to go back to branded medication. -Patient advised to return or notify health care team  if symptoms worsen or persist or new concerns arise.  Patient Instructions  300 mg  Of amitryptaline  Is maximum dose  . Caution with side  effects .  Can review formulary and try changing to  Another less expensive in class  consider  cymbalta gemeric used for depression and anxiety but helps nerve pain in some people.   But may not be as good for. sleep.  Other option increase the gabapentin . Instead . To 2 at night Stop the etodolac and try meloxicam if needed as antiinflammatory.  Continue  Pain management through dr Nelva Bush at this time.  If changing to generic.  Synthroid would check  Lab in 3 months .      Standley Brooking. Avriana Joo M.D.  Pre visit review using our clinic review tool, if applicable. No additional management  support is needed unless otherwise documented below in the visit note. Total visit 34mins > 50% spent counseling and coordinating care

## 2013-10-06 ENCOUNTER — Telehealth: Payer: Self-pay | Admitting: Family Medicine

## 2013-10-06 DIAGNOSIS — Z79899 Other long term (current) drug therapy: Secondary | ICD-10-CM | POA: Insufficient documentation

## 2013-10-06 MED ORDER — NORTRIPTYLINE HCL 50 MG PO CAPS: 150.0000 mg | ORAL_CAPSULE | Freq: Every day | ORAL | Status: DC

## 2013-10-06 NOTE — Telephone Encounter (Signed)
Message copied by Hulda Humphrey on Fri Oct 06, 2013  4:59 PM ------      Message from: [facility] [place] [facility]      Created: Fri Oct 06, 2013  4:43 PM      Regarding: change in med       [name]       Tell pt i checked her list and  Have her stop the elavil( amitriptyline) and change to nortriptyline 150 mg per hs .( 3- 50mg  capsules             I sent it in to her pharmacy already      Thanks      [facility] ------

## 2013-10-06 NOTE — Telephone Encounter (Signed)
Left message on home phone for the pt to return my call. 

## 2013-10-09 NOTE — Telephone Encounter (Signed)
Pt notified of all by telephone.

## 2013-10-10 ENCOUNTER — Ambulatory Visit: Payer: Medicare Other | Admitting: Internal Medicine

## 2013-10-27 ENCOUNTER — Ambulatory Visit (INDEPENDENT_AMBULATORY_CARE_PROVIDER_SITE_OTHER): Payer: Medicare Other | Admitting: Family Medicine

## 2013-10-27 ENCOUNTER — Encounter: Payer: Self-pay | Admitting: Family Medicine

## 2013-10-27 VITALS — BP 130/76 | Temp 98.9°F | Wt 107.0 lb

## 2013-10-27 DIAGNOSIS — T148 Other injury of unspecified body region: Secondary | ICD-10-CM

## 2013-10-27 DIAGNOSIS — W57XXXA Bitten or stung by nonvenomous insect and other nonvenomous arthropods, initial encounter: Secondary | ICD-10-CM

## 2013-10-27 MED ORDER — TRIAMCINOLONE ACETONIDE 0.1 % EX CREA: 1.0000 "application " | TOPICAL_CREAM | Freq: Two times a day (BID) | CUTANEOUS | Status: DC

## 2013-10-27 NOTE — Progress Notes (Signed)
Pre visit review using our clinic review tool, if applicable. No additional management support is needed unless otherwise documented below in the visit note. 

## 2013-10-27 NOTE — Progress Notes (Signed)
Chief Complaint  Patient presents with  . Rash    HPI:  Acute visit for Rash: -started with a bug bite, then noticed more yesterday -has a boxer and lab at home - does not think they have fleas -denies: malaise, fevers, illness -has been at daughters house the last few days painting, old house that daughter is moving into  ROS: See pertinent positives and negatives per HPI.  Past Medical History  Diagnosis Date  . Hypothyroidism   . Rosacea   . GERD (gastroesophageal reflux disease)     WOULD USE OTC MEDICATION IF NEEDED  . 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 Tachycardia     PT STATES HER HEART RATE USUALLY ABOVE 100    Past Surgical History  Procedure Laterality Date  . Tonsillectomy  1953  . Breast biopsy      bilateral benign  . Bunionectomy  2003    bilateral  . Lumbar laminectomy/decompression microdiscectomy Left 03/17/2013    Procedure: LUMBAR LAMINECTOMY/DECOMPRESSION IF RECESS STENOSIS FORAMINOTOMIE OF L4 ROOT AND L5,MICRODISECTOMY  L4-5 ON LEFT;  Surgeon: Tobi Bastos, MD;  Location: [facility];  Service: Orthopedics;  Laterality: Left;    Family History  Problem Relation Age of Onset  . Heart disease    . Pancreatic cancer Brother     History   Social History  . Marital Status: Married    Spouse Name: N/A    Number of Children: N/A  . Years of Education: N/A   Social History Main Topics  . Smoking status: Former Smoker -- 1.00 packs/day for 40 years    Types: Cigarettes    Quit date: 06/03/2012  . Smokeless tobacco: Never Used     Comment: back to tobacco 10 per day   . Alcohol Use: Yes  . Drug Use: No  . Sexual Activity: None   Other Topics Concern  . 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    No health insurance self employed  Now on medicare    2 dogs     Current outpatient  prescriptions:Ascorbic Acid (VITAMIN C WITH ROSE HIPS) 1000 MG tablet, Take 1,000 mg by mouth daily., Disp: , Rfl: ;  Biotin 5000 MCG TABS, Take 1 tablet by mouth daily., Disp: , Rfl: ;  diphenhydramine-acetaminophen (TYLENOL PM) 25-500 MG TABS, Take 1 tablet by mouth at bedtime. , Disp: , Rfl: ;  HYDROcodone-acetaminophen (NORCO/VICODIN) 5-325 MG per tablet, , Disp: , Rfl:  levothyroxine (SYNTHROID, LEVOTHROID) 75 MCG tablet, Take 1 tablet (75 mcg total) by mouth daily before breakfast., Disp: 90 tablet, Rfl: 1;  meloxicam (MOBIC) 15 MG tablet, Take 1 tablet (15 mg total) by mouth daily. As needed for pain, Disp: 30 tablet, Rfl: 3;  Multiple Vitamins-Minerals (MULTIVITAMIN WITH MINERALS) tablet, Take 1 tablet by mouth daily., Disp: , Rfl:  nortriptyline (PAMELOR) 50 MG capsule, Take 3 capsules (150 mg total) by mouth at bedtime., Disp: 90 capsule, Rfl: 1;  polyethylene glycol (MIRALAX / GLYCOLAX) packet, Take 17 g by mouth daily as needed (for constipation). , Disp: , Rfl: ;  triamcinolone cream (KENALOG) 0.1 %, Apply 1 application topically 2 (two) times daily., Disp: 30 g, Rfl: 1  EXAM:  Filed Vitals:   10/27/13 1121  BP: 130/76  Temp:  98.9 F (37.2 C)    Body mass index is 19.57 kg/(m^2).  GENERAL: vitals reviewed and listed above, alert, oriented, appears well hydrated and in no acute distress  HEENT: atraumatic, conjunttiva clear, no obvious abnormalities on inspection of external nose and ears  NECK: no obvious masses on inspection  SKIN: sporatically scattered erythematous papules  MS: moves all extremities without noticeable abnormality  PSYCH: pleasant and cooperative, no obvious depression or anxiety  ASSESSMENT AND PLAN:  Discussed the following assessment and plan:  Bug bites - Plan: triamcinolone cream (KENALOG) 0.1 %  -hx and exam c/w bug bites - likely flea or beg bug per appearance advised of treatment options, advised exterminator, return precuaitons -Patient  advised to return or notify a doctor immediately if symptoms worsen or persist or new concerns arise.  There are no Patient Instructions on file for this visit.   Colin Benton R.

## 2013-12-15 ENCOUNTER — Other Ambulatory Visit: Payer: Self-pay | Admitting: Internal Medicine

## 2013-12-15 ENCOUNTER — Encounter: Payer: Self-pay | Admitting: Internal Medicine

## 2013-12-15 ENCOUNTER — Ambulatory Visit (INDEPENDENT_AMBULATORY_CARE_PROVIDER_SITE_OTHER): Payer: Medicare Other | Admitting: Internal Medicine

## 2013-12-15 VITALS — BP 138/84 | HR 94 | Ht 62.0 in | Wt 102.0 lb

## 2013-12-15 DIAGNOSIS — L089 Local infection of the skin and subcutaneous tissue, unspecified: Secondary | ICD-10-CM

## 2013-12-15 DIAGNOSIS — Z79899 Other long term (current) drug therapy: Secondary | ICD-10-CM

## 2013-12-15 MED ORDER — NORTRIPTYLINE HCL 50 MG PO CAPS: 150.0000 mg | ORAL_CAPSULE | Freq: Every day | ORAL | Status: DC

## 2013-12-15 MED ORDER — VALACYCLOVIR HCL 1 G PO TABS: 1000.0000 mg | ORAL_TABLET | Freq: Three times a day (TID) | ORAL | Status: DC

## 2013-12-15 MED ORDER — DOXYCYCLINE HYCLATE 100 MG PO CAPS: 100.0000 mg | ORAL_CAPSULE | Freq: Two times a day (BID) | ORAL | Status: DC

## 2013-12-15 NOTE — Progress Notes (Signed)
Pre visit review using our clinic review tool, if applicable. No additional management support is needed unless otherwise documented below in the visit note. 

## 2013-12-15 NOTE — Progress Notes (Signed)
Chief Complaint  Patient presents with  . Rash    HPI: Patient comes in today for SDA for  new problem evaluation. Onset second day of [ain ful red bumps right  thigh  And getting more painful even around the area . No remembered bite no fever chills  ? Could this be shingles .  No hx of same .  No injury  Used some cortisone but not helpful and poss worse   Needs refill of pamelor also . ROS: See pertinent positives and negatives per HPI. No hx of staph in fections etc no trauma  No gu sx no hx of similar problem   Past Medical History  Diagnosis Date  . Hypothyroidism   . Rosacea   . GERD (gastroesophageal reflux disease)     WOULD USE OTC MEDICATION IF NEEDED  . 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 Tachycardia     PT STATES HER HEART RATE USUALLY ABOVE 100    Family History  Problem Relation Age of Onset  . Heart disease    . Pancreatic cancer Brother     History   Social History  . Marital Status: Married    Spouse Name: N/A    Number of Children: N/A  . Years of Education: N/A   Social History Main Topics  . Smoking status: Former Smoker -- 1.00 packs/day for 40 years    Types: Cigarettes    Quit date: 06/03/2012  . Smokeless tobacco: Never Used     Comment: back to tobacco 10 per day   . Alcohol Use: Yes  . Drug Use: No  . Sexual Activity: None   Other Topics Concern  . 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    No health insurance self employed  Now on medicare    2 dogs     Outpatient Encounter Prescriptions as of 12/15/2013  Medication Sig  . Ascorbic Acid (VITAMIN C WITH ROSE HIPS) 1000 MG tablet Take 1,000 mg by mouth daily.  . Biotin 5000 MCG TABS Take 1 tablet by mouth daily.  . diphenhydramine-acetaminophen (TYLENOL PM) 25-500 MG TABS Take 1 tablet by mouth at bedtime.   Marland Kitchen  HYDROcodone-acetaminophen (NORCO/VICODIN) 5-325 MG per tablet   . levothyroxine (SYNTHROID, LEVOTHROID) 75 MCG tablet Take 1 tablet (75 mcg total) by mouth daily before breakfast.  . meloxicam (MOBIC) 15 MG tablet Take 1 tablet (15 mg total) by mouth daily. As needed for pain  . Multiple Vitamins-Minerals (MULTIVITAMIN WITH MINERALS) tablet Take 1 tablet by mouth daily.  . nortriptyline (PAMELOR) 50 MG capsule Take 3 capsules (150 mg total) by mouth at bedtime.  . polyethylene glycol (MIRALAX / GLYCOLAX) packet Take 17 g by mouth daily as needed (for constipation).   . triamcinolone cream (KENALOG) 0.1 % Apply 1 application topically 2 (two) times daily.  . [DISCONTINUED] nortriptyline (PAMELOR) 50 MG capsule Take 3 capsules (150 mg total) by mouth at bedtime.  Marland Kitchen doxycycline (VIBRAMYCIN) 100 MG capsule Take 1 capsule (100 mg total) by mouth 2 (two) times daily.  . valACYclovir (VALTREX) 1000 MG tablet Take 1 tablet (1,000 mg total) by mouth 3 (three) times daily.    EXAM:  BP 138/84  Pulse 94  Ht 5\' 2"  (1.575 m)  Wt 102 lb (46.267 kg)  BMI 18.65 kg/m2  SpO2 94%  Body mass index is 18.65 kg/(m^2).  GENERAL: vitals reviewed and listed above, alert, oriented, appears well hydrated and in no acute distress HEENT: atraumatic, conjunctiva  clear, no obvious abnormalities on inspection of external nose and ears NECK: no obvious masses on inspection palpation  CV: HRRR, no clubbing cyanosis or  peripheral edema nl cap refill  Right thigh medial to groin scattered discrete coin shapped hyperemic lesions various sizes x 5  with surrponding redness  And tenderness on  On lesion  No abscess or fluctuance  No ing adenopathy  No vesicle  MS: moves all extremities without noticeable focal  abnormality PSYCH: pleasant and cooperative, no obvious depression or anxiety  ASSESSMENT AND PLAN:  Discussed the following assessment and plan:  Skin infection ? vs reaction bug bites vsatypical shingles  -  not typical but sig pain  linear distribution will cover for bacterial viral  over the weekend . close observation warranted   Medication management - refill pamelor consider trial switch cymbalta in future  Total visit 8mins > 50% spent counseling and coordinating care   -Patient advised to return or notify health care team  if symptoms worsen ,persist or new concerns arise.  Patient Instructions  This could be  Insect bite  But because of seerity of pain and swelling   We will add   Antibiotic in case   Bacterial infection and antiviral.   medicine in case this could be atypical shingles . exepct improvement either way in 3 days or so .   Standley Brooking. Panosh M.D.

## 2013-12-15 NOTE — Patient Instructions (Signed)
This could be  Insect bite  But because of seerity of pain and swelling   We will add   Antibiotic in case   Bacterial infection and antiviral.   medicine in case this could be atypical shingles . exepct improvement either way in 3 days or so .

## 2013-12-18 NOTE — Telephone Encounter (Signed)
Reviewed last note.  Should pt continue to take?

## 2013-12-19 ENCOUNTER — Telehealth: Payer: Self-pay | Admitting: Internal Medicine

## 2013-12-19 NOTE — Telephone Encounter (Signed)
Ok to swithc back to [employer] and change back med list  ( It is possible lower dose would have been better )  Ok to stop the valtrex

## 2013-12-19 NOTE — Telephone Encounter (Signed)
Spoke to the pt.  She would like to stop the Valtrex.  Says she is not in pain and looks like the "bumps" are going away due to the antibiotic.  Would like to go back to amitriptyline because the nortriptyline is making her "wobbly" and dizzy.  Please advise.  Thanks!

## 2013-12-19 NOTE — Telephone Encounter (Signed)
Please contact   Is she trying the pamelor ?to use instead of the elavil as we tried to swithc.

## 2013-12-19 NOTE — Telephone Encounter (Signed)
Pt states that her rx nortriptyline (PAMELOR) 50 MG capsule is not working for her and makes her dizzy, pt is requesting to go back to her original rx amitriptyline. Please send to [facility]. Also pt would like to speak directly with nurse regarding other meds that she is taking.

## 2013-12-20 NOTE — Telephone Encounter (Signed)
Left a message at the below listed number per the patient's request informing her that the medication has been sent to the pharmacy and it is okay to stop the Valtrex.

## 2013-12-25 ENCOUNTER — Other Ambulatory Visit: Payer: Self-pay | Admitting: Family Medicine

## 2013-12-25 ENCOUNTER — Other Ambulatory Visit (INDEPENDENT_AMBULATORY_CARE_PROVIDER_SITE_OTHER): Payer: Medicare Other

## 2013-12-25 DIAGNOSIS — R030 Elevated blood-pressure reading, without diagnosis of hypertension: Secondary | ICD-10-CM

## 2013-12-25 DIAGNOSIS — Z79899 Other long term (current) drug therapy: Secondary | ICD-10-CM

## 2013-12-25 DIAGNOSIS — E039 Hypothyroidism, unspecified: Secondary | ICD-10-CM

## 2013-12-25 LAB — TSH: TSH: 0.33 u[IU]/mL — ABNORMAL LOW (ref 0.35–5.50)

## 2013-12-25 LAB — BASIC METABOLIC PANEL
BUN: 9 mg/dL (ref 6–23)
CO2: 26 mEq/L (ref 19–32)
Calcium: 9 mg/dL (ref 8.4–10.5)
Chloride: 103 mEq/L (ref 96–112)
Creatinine, Ser: 0.8 mg/dL (ref 0.4–1.2)
GFR: 75.1 mL/min (ref >60.00)
GLUCOSE: 110 mg/dL — AB (ref 70–99)
POTASSIUM: 3.7 meq/L (ref 3.5–5.1)
Sodium: 137 mEq/L (ref 135–145)

## 2014-01-01 ENCOUNTER — Encounter: Payer: Self-pay | Admitting: Internal Medicine

## 2014-01-01 ENCOUNTER — Ambulatory Visit (INDEPENDENT_AMBULATORY_CARE_PROVIDER_SITE_OTHER): Payer: Medicare Other | Admitting: Internal Medicine

## 2014-01-01 VITALS — BP 140/82 | Temp 98.7°F | Ht 62.0 in | Wt 102.0 lb

## 2014-01-01 DIAGNOSIS — R5383 Other fatigue: Secondary | ICD-10-CM

## 2014-01-01 DIAGNOSIS — IMO0002 Reserved for concepts with insufficient information to code with codable children: Secondary | ICD-10-CM

## 2014-01-01 DIAGNOSIS — R634 Abnormal weight loss: Secondary | ICD-10-CM

## 2014-01-01 DIAGNOSIS — G479 Sleep disorder, unspecified: Secondary | ICD-10-CM

## 2014-01-01 DIAGNOSIS — R5381 Other malaise: Secondary | ICD-10-CM | POA: Insufficient documentation

## 2014-01-01 DIAGNOSIS — Z733 Stress, not elsewhere classified: Secondary | ICD-10-CM

## 2014-01-01 DIAGNOSIS — F439 Reaction to severe stress, unspecified: Secondary | ICD-10-CM | POA: Insufficient documentation

## 2014-01-01 DIAGNOSIS — M5410 Radiculopathy, site unspecified: Secondary | ICD-10-CM

## 2014-01-01 DIAGNOSIS — F4323 Adjustment disorder with mixed anxiety and depressed mood: Secondary | ICD-10-CM

## 2014-01-01 DIAGNOSIS — E039 Hypothyroidism, unspecified: Secondary | ICD-10-CM

## 2014-01-01 MED ORDER — SYNTHROID 75 MCG PO TABS: 75.0000 ug | ORAL_TABLET | Freq: Every day | ORAL | Status: DC

## 2014-01-01 MED ORDER — DULOXETINE HCL 20 MG PO CPEP: 20.0000 mg | ORAL_CAPSULE | Freq: Every day | ORAL | Status: DC

## 2014-01-01 NOTE — Assessment & Plan Note (Signed)
Change to generic levothyroxine TSH went down into the abnormal range she is complaining of fatigue and weight loss could be from other situations go back to brand.

## 2014-01-01 NOTE — Progress Notes (Signed)
Chief Complaint  Patient presents with  . Follow-up    Patient complains of fatigue.  Has never felt this tired.  Now on generic Synthroid.    HPI: [name]  comes in today for follow up of  multiple medical problems.   Since last time she is switched to generic levothyroxine. Doesn't feel as well having excess fatigue and a few pound weight loss.  Had st and glands  3 days  Resolved and then recurred  No fever.  No coughing no allergy sx. no cough fevers and night sweats wonders if she could have mono.  Pain med  Trying to  Get off.  Taking about once a night  And mobic  And  Sleep 5 hours .    Pain off and on.   Stress and worry about her daughter his marriage broke up having to work on helping her reestablish feels sad at times. ROS: See pertinent positives and negatives per HPI. No syncope vomiting change in bowel habits has not had a mammogram in a few years.  Past Medical History  Diagnosis Date  . Hypothyroidism   . Rosacea   . GERD (gastroesophageal reflux disease)     WOULD USE OTC MEDICATION IF NEEDED  . 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 Tachycardia     PT STATES HER HEART RATE USUALLY ABOVE 100    Family History  Problem Relation Age of Onset  . Heart disease    . Pancreatic cancer Brother     History   Social History  . Marital Status: Married    Spouse Name: N/A    Number of Children: N/A  . Years of Education: N/A   Social History Main Topics  . Smoking status: Former Smoker -- 1.00 packs/day for 40 years    Types: Cigarettes    Quit date: 06/03/2012  . Smokeless tobacco: Never Used     Comment: back to tobacco 10 per day   . Alcohol Use: Yes  . Drug Use: No  . Sexual Activity: None   Other Topics Concern  . 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    No health insurance self  employed  Now on medicare    2 dogs     Outpatient Encounter Prescriptions as of 01/01/2014  Medication Sig  . amitriptyline (ELAVIL) 100 MG tablet TAKE 3 TABLETS BY MOUTH EVERY DAY  . Ascorbic Acid (VITAMIN C WITH ROSE HIPS) 1000 MG tablet Take 1,000 mg by mouth daily.  . Biotin 5000 MCG TABS Take 1 tablet by mouth daily.  . diphenhydramine-acetaminophen (TYLENOL PM) 25-500 MG TABS Take 1 tablet by mouth at bedtime.   . gabapentin (NEURONTIN) 300 MG capsule Take 300 mg by mouth at bedtime.   Marland Kitchen HYDROcodone-acetaminophen (NORCO/VICODIN) 5-325 MG per tablet   . meloxicam (MOBIC) 15 MG tablet Take 1 tablet (15 mg total) by mouth daily. As needed for pain  . Multiple Vitamins-Minerals (MULTIVITAMIN WITH MINERALS) tablet Take 1 tablet by mouth daily.  . polyethylene glycol (MIRALAX / GLYCOLAX) packet Take 17 g by mouth daily as needed (for constipation).   . [DISCONTINUED] levothyroxine (SYNTHROID, LEVOTHROID) 75 MCG tablet Take 1 tablet (75 mcg total) by mouth daily before breakfast.  . DULoxetine (CYMBALTA)  20 MG capsule Take 1 capsule (20 mg total) by mouth daily.  Marland Kitchen SYNTHROID 75 MCG tablet Take 1 tablet (75 mcg total) by mouth daily before breakfast.  . [DISCONTINUED] doxycycline (VIBRAMYCIN) 100 MG capsule Take 1 capsule (100 mg total) by mouth 2 (two) times daily.  . [DISCONTINUED] nortriptyline (PAMELOR) 50 MG capsule Take 3 capsules (150 mg total) by mouth at bedtime.  . [DISCONTINUED] triamcinolone cream (KENALOG) 0.1 % Apply 1 application topically 2 (two) times daily.  . [DISCONTINUED] valACYclovir (VALTREX) 1000 MG tablet Take 1 tablet (1,000 mg total) by mouth 3 (three) times daily.    EXAM:  BP 140/82  Temp(Src) 98.7 F (37.1 C) (Oral)  Ht 5\' 2"  (1.575 m)  Wt 102 lb (46.267 kg)  BMI 18.65 kg/m2  Body mass index is 18.65 kg/(m^2).  GENERAL: vitals reviewed and listed above, alert, oriented, appears well hydrated and in no acute distress HEENT: atraumatic, conjunctiva   clear, no obvious abnormalities on inspection of external nose and ears OP : no lesion edema or exudate dry mucous membranes NECK: no obvious masses on inspection palpation  LUNGS: clear to auscultation bilaterally, no wheezes, rales or rhonchi,  CV: HRRR, no clubbing cyanosis or  peripheral edema nl cap refill  Abdomen soft without organomegaly guarding or rebound MS: moves all extremities without noticeable focal  abnormality PSYCH: pleasant and cooperative, no obvious depression or anxiety good eye contact. Lab Results  Component Value Date   WBC 6.5 07/26/2013   HGB 12.7 07/26/2013   HCT 38.1 07/26/2013   PLT 276.0 07/26/2013   GLUCOSE 110* 12/25/2013   CHOL 197 07/26/2013   TRIG 94.0 07/26/2013   HDL 56.20 07/26/2013   LDLDIRECT 132.5 06/23/2010   LDLCALC 122* 07/26/2013   ALT 11 07/26/2013   AST 12 07/26/2013   NA 137 12/25/2013   K 3.7 12/25/2013   CL 103 12/25/2013   CREATININE 0.8 12/25/2013   BUN 9 12/25/2013   CO2 26 12/25/2013   TSH 0.33* 12/25/2013   INR 0.91 03/16/2013   HGBA1C 5.5 06/18/2008    ASSESSMENT AND PLAN:  Discussed the following assessment and plan:  Unspecified hypothyroidism  UNSPECIFIED SLEEP DISTURBANCE - Okay to remain on Elavil she states works better and had a side effect from nortriptyline.  Radicular pain of left lower extremity - Agree with limiting narcotic medication.  Adjustment reaction with anxiety and depression  Other malaise and fatigue - Plan: DG Chest 2 View  Loss of weight - Couple pounds could be related to stress and thyroid change we'll follow closely no alarm features on exam check chest x-ray at her convenience - Plan: DG Chest 2 View  Stress - Family situation. Coping fairly well but could add to her symptoms.  -Patient advised to return or notify health care team  if symptoms worsen ,persist or new concerns arise.  Patient Instructions  Change back to branded synthroid. Tired and weight loss could be from stress but also thyroid  issue. Agree with trying to use narcotic pain med just as needed and infrequent.  Add los dose cymbalta  for mood help and pain if tolerating we could increase to 30 mg per day and higher . May get nausea when first starting but goes away with time.  Get a mammogram.  chest x ray  To be sure ok cause of the weight loss and fatigue . return office visit in   8 weeks  tsh pre visit .     Standley Brooking.  Panosh M.D.   Pre visit review using our clinic review tool, if applicable. No additional management support is needed unless otherwise documented below in the visit note.

## 2014-01-01 NOTE — Patient Instructions (Addendum)
Change back to branded synthroid. Tired and weight loss could be from stress but also thyroid issue. Agree with trying to use narcotic pain med just as needed and infrequent.  Add los dose cymbalta  for mood help and pain if tolerating we could increase to 30 mg per day and higher . May get nausea when first starting but goes away with time.  Get a mammogram.  chest x ray  To be sure ok cause of the weight loss and fatigue . return office visit in   8 weeks  tsh cbcdiff pre visit .

## 2014-01-03 ENCOUNTER — Telehealth: Payer: Self-pay | Admitting: Internal Medicine

## 2014-01-03 DIAGNOSIS — E039 Hypothyroidism, unspecified: Secondary | ICD-10-CM

## 2014-01-03 DIAGNOSIS — R7989 Other specified abnormal findings of blood chemistry: Secondary | ICD-10-CM

## 2014-01-03 NOTE — Telephone Encounter (Signed)
Dr Regis Bill wanted pt to have tsh cbcdiff pre visit. Can you put the orders in? Thanks!

## 2014-01-04 NOTE — Telephone Encounter (Signed)
Order placed in the system. 

## 2014-01-29 ENCOUNTER — Other Ambulatory Visit: Payer: Self-pay | Admitting: Internal Medicine

## 2014-02-01 NOTE — Telephone Encounter (Signed)
Ok to send in ( i did)

## 2014-02-17 ENCOUNTER — Other Ambulatory Visit: Payer: Self-pay | Admitting: Family Medicine

## 2014-02-18 ENCOUNTER — Other Ambulatory Visit: Payer: Self-pay | Admitting: Internal Medicine

## 2014-02-20 NOTE — Telephone Encounter (Signed)
Sent to the pharmacy by e-scribe. 

## 2014-02-26 ENCOUNTER — Other Ambulatory Visit (INDEPENDENT_AMBULATORY_CARE_PROVIDER_SITE_OTHER): Payer: Medicare Other

## 2014-02-26 DIAGNOSIS — R7989 Other specified abnormal findings of blood chemistry: Secondary | ICD-10-CM

## 2014-02-26 DIAGNOSIS — E039 Hypothyroidism, unspecified: Secondary | ICD-10-CM

## 2014-02-26 LAB — CBC WITH DIFFERENTIAL/PLATELET
Basophils Absolute: 0 10*3/uL (ref 0.0–0.1)
Basophils Relative: 0.6 % (ref 0.0–3.0)
EOS PCT: 6.3 % — AB (ref 0.0–5.0)
Eosinophils Absolute: 0.4 10*3/uL (ref 0.0–0.7)
HEMATOCRIT: 35.5 % — AB (ref 36.0–46.0)
HEMOGLOBIN: 11.9 g/dL — AB (ref 12.0–15.0)
LYMPHS ABS: 1.8 10*3/uL (ref 0.7–4.0)
Lymphocytes Relative: 29 % (ref 12.0–46.0)
MCHC: 33.4 g/dL (ref 30.0–36.0)
MCV: 99.1 fl (ref 78.0–100.0)
MONOS PCT: 6.3 % (ref 3.0–12.0)
Monocytes Absolute: 0.4 10*3/uL (ref 0.1–1.0)
NEUTROS ABS: 3.5 10*3/uL (ref 1.4–7.7)
Neutrophils Relative %: 57.8 % (ref 43.0–77.0)
Platelets: 273 10*3/uL (ref 150.0–400.0)
RBC: 3.58 Mil/uL — ABNORMAL LOW (ref 3.87–5.11)
RDW: 14.3 % (ref 11.5–15.5)
WBC: 6.1 10*3/uL (ref 4.0–10.5)

## 2014-02-26 LAB — TSH: TSH: 0.73 u[IU]/mL (ref 0.35–4.50)

## 2014-03-06 ENCOUNTER — Ambulatory Visit (INDEPENDENT_AMBULATORY_CARE_PROVIDER_SITE_OTHER): Payer: Medicare Other | Admitting: Internal Medicine

## 2014-03-06 ENCOUNTER — Encounter: Payer: Self-pay | Admitting: Internal Medicine

## 2014-03-06 VITALS — BP 138/84 | Temp 98.6°F | Ht 62.0 in | Wt 102.0 lb

## 2014-03-06 DIAGNOSIS — Z79899 Other long term (current) drug therapy: Secondary | ICD-10-CM

## 2014-03-06 DIAGNOSIS — R636 Underweight: Secondary | ICD-10-CM | POA: Insufficient documentation

## 2014-03-06 DIAGNOSIS — D649 Anemia, unspecified: Secondary | ICD-10-CM | POA: Insufficient documentation

## 2014-03-06 DIAGNOSIS — M1990 Unspecified osteoarthritis, unspecified site: Secondary | ICD-10-CM

## 2014-03-06 DIAGNOSIS — E038 Other specified hypothyroidism: Secondary | ICD-10-CM

## 2014-03-06 NOTE — Progress Notes (Signed)
Pre visit review using our clinic review tool, if applicable. No additional management support is needed unless otherwise documented below in the visit note.  Chief Complaint  Patient presents with  . Follow-up    HPI: [name]  comes in today for follow up of  multiple medical problems.  States she feels overall much better than last visit.  backs tolerable  Now and feels ok. Is been cleared to try to start to play tennis or more activity still has a lot of stress  mobic at nigjht   With elevil . 300 mg and sleeps well wants to stay on this dose Same   Family concerned because she still hasn't gained much weight but she is not losing any more and feels like her appetite is picking up finally.  Denies any GI bleeding blood in stool epigastric pain no significant bruising or bleeding  She did try the Cymbalta 20 mg and got a headache that she felt possibly was from the medicine so she stopped it for about a week later . Thinks it could be in the medicine but unsure  Hasn't been checking her blood pressure to she thought it would be good doesn't have a machine at home ROS: See pertinent positives and negatives per HPI. No chest pain or shortness of breath at this time neurologic symptoms mood is improved  Past Medical History  Diagnosis Date  . Hypothyroidism   . Rosacea   . GERD (gastroesophageal reflux disease)     WOULD USE OTC MEDICATION IF NEEDED  . 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 Tachycardia     PT STATES HER HEART RATE USUALLY ABOVE 100    Family History  Problem Relation Age of Onset  . Heart disease    . Pancreatic cancer Brother     History   Social History  . Marital Status: Married    Spouse Name: N/A    Number of Children: N/A  . Years of Education: N/A   Social History Main Topics  . Smoking status: Former Smoker -- 1.00 packs/day for 40 years    Types: Cigarettes    Quit date: 06/03/2012  . Smokeless tobacco: Never Used     Comment: back to tobacco 10 per day   . Alcohol Use: Yes  . Drug Use: No  . Sexual Activity: None   Other Topics Concern  . 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    No health insurance self employed  Now on medicare    2 dogs     Outpatient Encounter Prescriptions as of 03/06/2014  Medication Sig  . amitriptyline (ELAVIL) 100 MG tablet TAKE 3 TABLETS BY MOUTH EVERY DAY  . Ascorbic Acid (VITAMIN C WITH ROSE HIPS) 1000 MG tablet Take 1,000 mg by mouth daily.  . Biotin 5000 MCG TABS Take 1 tablet by mouth daily.  . diphenhydramine-acetaminophen (TYLENOL PM) 25-500 MG TABS Take 1 tablet by mouth at bedtime.   . DULoxetine (CYMBALTA) 20 MG capsule Take 1 capsule (20 mg total) by mouth daily.  Marland Kitchen gabapentin (NEURONTIN) 300 MG capsule Take 300 mg by mouth at bedtime.   Marland Kitchen HYDROcodone-acetaminophen (NORCO/VICODIN) 5-325 MG per tablet   . meloxicam (MOBIC) 15 MG tablet TAKE 1 TABLET (15  MG TOTAL) BY MOUTH DAILY. AS NEEDED FOR PAIN  . Multiple Vitamins-Minerals (MULTIVITAMIN WITH MINERALS) tablet Take 1 tablet by mouth daily.  . polyethylene glycol (MIRALAX / GLYCOLAX) packet Take 17 g by mouth daily as needed (for constipation).   . SYNTHROID 75 MCG tablet Take 1 tablet (75 mcg total) by mouth daily before breakfast.    EXAM:  BP 138/84  Temp(Src) 98.6 F (37 C) (Oral)  Ht 5\' 2"  (1.575 m)  Wt 102 lb (46.267 kg)  BMI 18.65 kg/m2  Body mass index is 18.65 kg/(m^2). Repeat bp readings in range r 138/84 left 134/80 GENERAL: vitals reviewed and listed above, alert, oriented, appears well hydrated and in no acute distress HEENT: atraumatic, conjunctiva  clear, no obvious abnormalities on inspection of external nose and ears NECK: no obvious masses on inspection palpation  LUNGS: clear to auscultation bilaterally, no wheezes, rales or rhonchi, good  air movement CV: HRRR, no clubbing cyanosis or  peripheral edema nl cap refill  MS: moves all extremities without noticeable focal  abnormality PSYCH: pleasant and cooperative, no obvious depression or anxiety Lab Results  Component Value Date   WBC 6.1 02/26/2014   HGB 11.9* 02/26/2014   HCT 35.5* 02/26/2014   PLT 273.0 02/26/2014   GLUCOSE 110* 12/25/2013   CHOL 197 07/26/2013   TRIG 94.0 07/26/2013   HDL 56.20 07/26/2013   LDLDIRECT 132.5 06/23/2010   LDLCALC 122* 07/26/2013   ALT 11 07/26/2013   AST 12 07/26/2013   NA 137 12/25/2013   K 3.7 12/25/2013   CL 103 12/25/2013   CREATININE 0.8 12/25/2013   BUN 9 12/25/2013   CO2 26 12/25/2013   TSH 0.73 02/26/2014   INR 0.91 03/16/2013   HGBA1C 5.5 06/18/2008   BP Readings from Last 3 Encounters:  03/06/14 138/84  01/01/14 140/82  12/15/13 138/84    ASSESSMENT AND PLAN:  Discussed the following assessment and plan:  Other specified hypothyroidism - better back on brand  Medication management  Osteoarthrosis, unspecified whether generalized or localized, unspecified site - stable   tolerating pain  ocass pain med mostly not.increasing activiy to try tennis  Underweight - stabilizing doing better per report . follow   Borderline anemia - This had resolved and is now 11.9 again no GI bleeding but at risk. On Mobic. We'll follow her CBC. Perhaps get iron studies at some time Advised mammogram.   May get pap at check up hasnt had one in years  Revisit at that time. utd on colon  -Patient advised to return or notify health care team  if symptoms worsen ,persist or new concerns arise.  Patient Instructions   blood pressure is better on repeat . Monitor as time. Continue synthroid . Thyroid is better .  Borderline anemia has come back  We should follow up at your next visit  In December . increase healthy foods  .  calories  . Ok to stay off the cymbalta  For now.   Wt Readings from Last 3 Encounters:  03/06/14 102 lb (46.267 kg)  01/01/14 102 lb  (46.267 kg)  12/15/13 102 lb (46.267 kg)      Mariann Laster K. Ghadeer Kastelic M.D.  Total visit 42mins > 50% spent counseling and coordinating care    Disc above alarm features  aneima diff dx and thyroid . Also preventive issue  Ask about pap at wellness but utuslly not needed unless no reg prev screening assess pap need at wellness visit

## 2014-03-06 NOTE — Patient Instructions (Signed)
blood pressure is better on repeat . Monitor as time. Continue synthroid . Thyroid is better .  Borderline anemia has come back  We should follow up at your next visit  In December . increase healthy foods  .  calories  . Ok to stay off the cymbalta  For now.   Wt Readings from Last 3 Encounters:  03/06/14 102 lb (46.267 kg)  01/01/14 102 lb (46.267 kg)  12/15/13 102 lb (46.267 kg)

## 2014-03-07 NOTE — Telephone Encounter (Signed)
Ok 3 refills per [facility].  Sent by e-scribe.

## 2014-04-26 ENCOUNTER — Other Ambulatory Visit: Payer: Self-pay | Admitting: Internal Medicine

## 2014-04-27 NOTE — Telephone Encounter (Signed)
Sent to the pharmacy by e-scribe. 

## 2014-04-27 NOTE — Telephone Encounter (Signed)
Ok x 3  

## 2014-04-30 ENCOUNTER — Emergency Department (HOSPITAL_COMMUNITY): Payer: Medicare Other

## 2014-04-30 ENCOUNTER — Ambulatory Visit (INDEPENDENT_AMBULATORY_CARE_PROVIDER_SITE_OTHER): Payer: Medicare Other | Admitting: Family Medicine

## 2014-04-30 ENCOUNTER — Emergency Department (HOSPITAL_COMMUNITY)
Admission: EM | Admit: 2014-04-30 | Discharge: 2014-04-30 | Disposition: A | Discharge status: Home / Self Care | Payer: Medicare Other | Attending: Emergency Medicine | Admitting: Emergency Medicine

## 2014-04-30 ENCOUNTER — Encounter (HOSPITAL_COMMUNITY): Payer: Self-pay | Admitting: Emergency Medicine

## 2014-04-30 ENCOUNTER — Ambulatory Visit: Payer: Medicare Other

## 2014-04-30 VITALS — BP 98/70 | HR 98 | Temp 97.3°F | Resp 17

## 2014-04-30 DIAGNOSIS — Z79899 Other long term (current) drug therapy: Secondary | ICD-10-CM | POA: Diagnosis not present

## 2014-04-30 DIAGNOSIS — B820 Intestinal helminthiasis, unspecified: Secondary | ICD-10-CM | POA: Diagnosis not present

## 2014-04-30 DIAGNOSIS — Z872 Personal history of diseases of the skin and subcutaneous tissue: Secondary | ICD-10-CM | POA: Insufficient documentation

## 2014-04-30 DIAGNOSIS — R4789 Other speech disturbances: Secondary | ICD-10-CM | POA: Diagnosis present

## 2014-04-30 DIAGNOSIS — G252 Other specified forms of tremor: Secondary | ICD-10-CM

## 2014-04-30 DIAGNOSIS — Z8639 Personal history of other endocrine, nutritional and metabolic disease: Secondary | ICD-10-CM | POA: Insufficient documentation

## 2014-04-30 DIAGNOSIS — I671 Cerebral aneurysm, nonruptured: Secondary | ICD-10-CM | POA: Diagnosis not present

## 2014-04-30 DIAGNOSIS — Z87891 Personal history of nicotine dependence: Secondary | ICD-10-CM | POA: Insufficient documentation

## 2014-04-30 DIAGNOSIS — Z791 Long term (current) use of non-steroidal anti-inflammatories (NSAID): Secondary | ICD-10-CM | POA: Insufficient documentation

## 2014-04-30 DIAGNOSIS — G250 Essential tremor: Secondary | ICD-10-CM | POA: Diagnosis not present

## 2014-04-30 DIAGNOSIS — R4182 Altered mental status, unspecified: Secondary | ICD-10-CM

## 2014-04-30 DIAGNOSIS — M1990 Unspecified osteoarthritis, unspecified site: Secondary | ICD-10-CM | POA: Diagnosis not present

## 2014-04-30 DIAGNOSIS — Z862 Personal history of diseases of the blood and blood-forming organs and certain disorders involving the immune mechanism: Secondary | ICD-10-CM | POA: Insufficient documentation

## 2014-04-30 DIAGNOSIS — R4702 Dysphasia: Secondary | ICD-10-CM

## 2014-04-30 DIAGNOSIS — R2681 Unsteadiness on feet: Secondary | ICD-10-CM

## 2014-04-30 LAB — I-STAT TROPONIN, ED: TROPONIN I, POC: 0.01 ng/mL (ref 0.00–0.08)

## 2014-04-30 LAB — COMPREHENSIVE METABOLIC PANEL
ALBUMIN: 3.2 g/dL — AB (ref 3.5–5.2)
ALK PHOS: 70 U/L (ref 39–117)
ALT: 7 U/L (ref 0–35)
ANION GAP: 11 (ref 5–15)
AST: 14 U/L (ref 0–37)
BUN: 9 mg/dL (ref 6–23)
CO2: 24 mEq/L (ref 19–32)
CREATININE: 0.81 mg/dL (ref 0.50–1.10)
Calcium: 8.3 mg/dL — ABNORMAL LOW (ref 8.4–10.5)
Chloride: 100 mEq/L (ref 96–112)
GFR calc non Af Amer: 74 mL/min — ABNORMAL LOW (ref >90)
GFR, EST AFRICAN AMERICAN: 86 mL/min — AB (ref >90)
GLUCOSE: 147 mg/dL — AB (ref 70–99)
POTASSIUM: 3.9 meq/L (ref 3.7–5.3)
Sodium: 135 mEq/L — ABNORMAL LOW (ref 137–147)
TOTAL PROTEIN: 6.2 g/dL (ref 6.0–8.3)
Total Bilirubin: <0.2 mg/dL — ABNORMAL LOW (ref 0.3–1.2)

## 2014-04-30 LAB — DIFFERENTIAL
Basophils Absolute: 0 10*3/uL (ref 0.0–0.1)
Basophils Relative: 0 % (ref 0–1)
EOS ABS: 0.2 10*3/uL (ref 0.0–0.7)
Eosinophils Relative: 2 % (ref 0–5)
Lymphocytes Relative: 14 % (ref 12–46)
Lymphs Abs: 1.2 10*3/uL (ref 0.7–4.0)
MONO ABS: 0.3 10*3/uL (ref 0.1–1.0)
MONOS PCT: 4 % (ref 3–12)
NEUTROS PCT: 80 % — AB (ref 43–77)
Neutro Abs: 6.8 10*3/uL (ref 1.7–7.7)

## 2014-04-30 LAB — CBC
HCT: 33.5 % — ABNORMAL LOW (ref 36.0–46.0)
Hemoglobin: 11.6 g/dL — ABNORMAL LOW (ref 12.0–15.0)
MCH: 33.8 pg (ref 26.0–34.0)
MCHC: 34.6 g/dL (ref 30.0–36.0)
MCV: 97.7 fL (ref 78.0–100.0)
Platelets: 227 10*3/uL (ref 150–400)
RBC: 3.43 MIL/uL — ABNORMAL LOW (ref 3.87–5.11)
RDW: 12.9 % (ref 11.5–15.5)
WBC: 8.5 10*3/uL (ref 4.0–10.5)

## 2014-04-30 LAB — I-STAT CHEM 8, ED
BUN: 8 mg/dL (ref 6–23)
Calcium, Ion: 1.14 mmol/L (ref 1.13–1.30)
Chloride: 103 mEq/L (ref 96–112)
Creatinine, Ser: 0.9 mg/dL (ref 0.50–1.10)
Glucose, Bld: 149 mg/dL — ABNORMAL HIGH (ref 70–99)
HEMATOCRIT: 38 % (ref 36.0–46.0)
Hemoglobin: 12.9 g/dL (ref 12.0–15.0)
POTASSIUM: 3.6 meq/L — AB (ref 3.7–5.3)
Sodium: 136 mEq/L — ABNORMAL LOW (ref 137–147)
TCO2: 23 mmol/L (ref 0–100)

## 2014-04-30 LAB — CBG MONITORING, ED: GLUCOSE-CAPILLARY: 114 mg/dL — AB (ref 70–99)

## 2014-04-30 LAB — PROTIME-INR
INR: 1.04 (ref 0.00–1.49)
PROTHROMBIN TIME: 13.6 s (ref 11.6–15.2)

## 2014-04-30 LAB — APTT: aPTT: 26 seconds (ref 24–37)

## 2014-04-30 MED ORDER — STUDY - INVESTIGATIONAL DRUG SIMPLE RECORD
325.0000 mg | Freq: Once | Status: DC
  Filled 2014-04-30: qty 325

## 2014-04-30 MED ORDER — STUDY - INVESTIGATIONAL DRUG SIMPLE RECORD
0.9000 mg/kg | Freq: Once | Status: DC
  Filled 2014-04-30: qty 35.1

## 2014-04-30 NOTE — Code Documentation (Signed)
68yo female arriving to [facility] at 26 via [place].  EMS reports that the patient was drinking coffee when she developed right arm tremors and dropped her cup at 1000.  Patient called family and they noticed slurred speech.  EMS activated Code Stroke.  Patient taken to CT on arrival. NIHSS 3, see documentation for details and code stroke times.  Patient with bilateral ataxia and dysarthria.  Dr. Leonel Ramsay at bedside for assessment.  Patient is too mild to treat at this time.  Research staff called to notify as patient may be a potential research candidate.  Patient taken to MRI with Stroke RN and RRT RN.  Dr. Leonel Ramsay reviewed films.  Patient back to room and patient reassessed by Dr. Leonel Ramsay and Dr. Leonie Man.  Patient's husband at the bedside.  He reports that the patient first c/o dizziness at 0945.  His son in law went to their house to check the patient's medications and it appears that the patient took tonight's medications this morning instead of her day medications.  Her nighttime medications include sleeping aids, pain medication and anti depressant medications.  No acute stroke treatment or stroke research at this time per MD.  Patient remains in the window to treat with tPA until 1415 should symptoms worsen.  Bedside handoff with ED RN Hayley.

## 2014-04-30 NOTE — ED Notes (Addendum)
Pt taken to MRI with Stroke Nurse

## 2014-04-30 NOTE — ED Notes (Signed)
Per [facility], pt was drinking coffee and started having tremors in her right hand. Tremors resolved on their arrival. Pt started having some slurred speech and aphasia with EMS, states the MD from [facility] didn't mention any speech deficits. 20g to LFA. VSS, CBG 186.

## 2014-04-30 NOTE — Consult Note (Signed)
Neurology Consultation Reason for Consult: Slurred speech Referring Physician: Sabra Heck, B.  CC: Slurred speech  History is obtained from: Patient, family  HPI: [name] is a 68 y.o. female who was in her normal state of health this morning, but complained of being slightly dizzy around 9:45 AM. Around 10 AM, she was noticed to be slurring her words and having some unsteady gait.   Of note, she took her evening medicines this morning by accident. This did include amitriptyline which does include both ataxia and dysarthria under side effects and his prescription information. MRI fails to demonstrate clear evidence of infarct.   LKW: 9:45 AM tpa given?: no, mild symptoms    ROS: A 14 point ROS was performed and is negative except as noted in the HPI.   Past Medical History  Diagnosis Date  . Hypothyroidism   . Rosacea   . GERD (gastroesophageal reflux disease)     WOULD USE OTC MEDICATION IF NEEDED  . 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 Tachycardia     PT STATES HER HEART RATE USUALLY ABOVE 100    Family History: No history of stroke  Social History: Tob: Current smoker  Exam: Current vital signs: BP 166/99  Pulse 84  Temp(Src) 97.4 F (36.3 C) (Oral)  Resp 12  Wt 39.009 kg (86 lb)  SpO2 98% Vital signs in last 24 hours: Temp:  [97.3 F (36.3 C)-97.4 F (36.3 C)] 97.4 F (36.3 C) (09/07 1324) Pulse Rate:  [80-98] 84 (09/07 1415) Resp:  [11-19] 12 (09/07 1415) BP: (98-166)/(64-99) 166/99 mmHg (09/07 1415) SpO2:  [96 %-100 %] 98 % (09/07 1415) Weight:  [39.009 kg (86 lb)] 39.009 kg (86 lb) (09/07 1210)  General: In bed, NAD CV: Regular rate and rhythm Mental Status: Patient is awake, alert, oriented to person, place, month, year, and situation. Immediate and remote memory are intact. Patient is able to give a clear and coherent history. No signs of aphasia or neglect She is mildly dysarthric Cranial Nerves: II: Visual Fields are  full. Pupils are equal, round, and reactive to light.  Discs are difficult to visualize. III,IV, VI: EOMI without ptosis or diploplia.  V: Facial sensation is symmetric to temperature VII: Facial movement is symmetric.  VIII: hearing is intact to voice X: Uvula elevates symmetrically XI: Shoulder shrug is symmetric. XII: tongue is midline without atrophy or fasciculations.  Motor: Tone is normal. Bulk is normal. 5/5 strength was present in all four extremities.  Sensory: Sensation is symmetric to light touch and temperature in the arms and legs. Deep Tendon Reflexes: 2+ and symmetric in the biceps and patellae.  Plantars: Toes are downgoing bilaterally.  Cerebellar: She has mild tremor on finger-nose-finger and heel-knee-shin bilaterally Gait: She is able to ambulate without assistance, though when backing up she becomes unsteady.    I have reviewed labs in epic and the results pertinent to this consultation are: Mildly low sodium  I have reviewed the images obtained: CT head is negative MRI head-no clear evidence of infarct  Impression: 68 year old female with slurred speech and unsteadiness in the setting of doubling her dose of amitriptyline with a morning dose by accident as well as gabapentin, and possibly Benadryl and Vicodin. Given this history, I suspect that medication effect could be playing a role.  She is on a very large dose of amitriptyline, especially considering her weight of 39 kg.  Recommendations: 1) I would favor watching her to make sure  that her gait becomes more stable, and if it does then no further evaluation or admission is needed 2) no further evaluation is needed, unless the patient has not returned to baseline by tomorrow.   Roland Rack, MD [facility] [phone]  If 7pm- 7am, please page neurology on call as listed in [place].

## 2014-04-30 NOTE — Progress Notes (Signed)
Subjective: 68 year old lady who came in here with history of having been trying to drink some coffee about 10 AM when she developed a tremor and spilled her coffee. She had a hard time speaking. She called her daughter eventually after trying to reach several individuals. She had a general body tremulousness. She had some slurring of her speech though audible.  She apparently had staggering of her gait she walked. She denied any lateral weakness. Her daughter brought her here to urgent medical and family care. She has some pain medicines but had not taken any this morning. The patient said she felt drunk or drugged. No pain. She does smoke one half pack of cigarettes a day. No history of hypertension in recent years though apparently it was once high.  Objective: Threasa Beards was alert but a little hard to understand. She spoke in a soft voice with some difficulty being understood. Eyes equal. Cranial nerves grossly intact no carotid bruits. Chest clear. Heart regular without murmurs. Moves all extremities. Can lift arms or legs off the bed. No unilateral palmar drift though she did not hold her hands very still. Blood pressure repeat was 98/70.  Assessment: Acute tremulousness Dysphagia Gait disturbance Suspicious for acute cva Tobacco use disorder Hypothyroidism   Plan: IV  Laid patient flat Oxygen EMS was here very quickly and took the patient to emergency room Spoke to ER physician on duty, Dr. Christy Gentles, notified of suspicious CVA

## 2014-04-30 NOTE — ED Notes (Addendum)
Pt returned from MRI, more alert and responsive. Husband states that her night meds are missing from her pill box so thinks she might have taken her night time medications.

## 2014-04-30 NOTE — Discharge Instructions (Signed)
You have been seen by the emergency department physician as well as the neurology specialist. At this time your MRI shows no signs of stroke, it does show a very tiny aneurysm which needs no attention today but does need to be followed up by your family doctor. It was thought that your symptoms occurred because she took her nighttime medication in the morning, they can cause these symptoms. Please be careful about the timing of her medications, take them as directed, return to the hospital for severe or worsening symptoms including weakness, numbness, change in vision or difficulty with speech.  Please call your doctor for a followup appointment within 24-48 hours. When you talk to your doctor please let them know that you were seen in the emergency department and have them acquire all of your records so that they can discuss the findings with you and formulate a treatment plan to fully care for your new and ongoing problems.

## 2014-04-30 NOTE — ED Notes (Signed)
Returned to room 14. Dr. Leonel Ramsay and Dr. Sabra Heck at the bedside,

## 2014-04-30 NOTE — ED Notes (Signed)
Per Dr. Leonie Man pharmacy made aware pt being considered [facility]

## 2014-04-30 NOTE — ED Provider Notes (Signed)
CSN: [id number]     Arrival date & time 04/30/14  1145 History   First MD Initiated Contact with Patient 04/30/14 1149     Chief Complaint  Patient presents with  . Code Stroke     (Consider location/radiation/quality/duration/timing/severity/associated sxs/prior Treatment) HPI Comments: 68 year old female, history of hypothyroidism, acid reflux presents with a complaint of the acute onset of tremor and possible slurred speech. The patient has acute onset of symptoms at 10:00 AM, this had resolved upon paramedics arrival and was not associated with shortness of breath, palpitations, headache, blurred vision, weakness or numbness. No medications given prior to arrival, code stroke activated in the field.  The history is provided by the patient and the EMS personnel.    Past Medical History  Diagnosis Date  . Hypothyroidism   . Rosacea   . GERD (gastroesophageal reflux disease)     WOULD USE OTC MEDICATION IF NEEDED  . 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 Tachycardia     PT STATES HER HEART RATE USUALLY ABOVE 100   Past Surgical History  Procedure Laterality Date  . Tonsillectomy  1953  . Breast biopsy      bilateral benign  . Bunionectomy  2003    bilateral  . Lumbar laminectomy/decompression microdiscectomy Left 03/17/2013    Procedure: LUMBAR LAMINECTOMY/DECOMPRESSION IF RECESS STENOSIS FORAMINOTOMIE OF L4 ROOT AND L5,MICRODISECTOMY  L4-5 ON LEFT;  Surgeon: Tobi Bastos, MD;  Location: [facility];  Service: Orthopedics;  Laterality: Left;   Family History  Problem Relation Age of Onset  . Heart disease    . Pancreatic cancer Brother    History  Substance Use Topics  . Smoking status: Former Smoker -- 1.00 packs/day for 48 years    Types: Cigarettes  . Smokeless tobacco: Never Used     Comment: back to tobacco 10 per day   . Alcohol Use: Yes   OB History   Grav Para Term Preterm Abortions TAB SAB Ect Mult Living   3 3             Review of Systems   All other systems reviewed and are negative.     Allergies  Review of patient's allergies indicates no known allergies.  Home Medications   Prior to Admission medications   Medication Sig Start Date End Date Taking? Authorizing Provider  amitriptyline (ELAVIL) 100 MG tablet Take 300 mg by mouth at bedtime.   Yes Historical Provider, MD  Ascorbic Acid (VITAMIN C WITH ROSE HIPS) 1000 MG tablet Take 1,000 mg by mouth daily.   Yes Historical Provider, MD  Biotin 5000 MCG TABS Take 5,000 mcg by mouth daily.    Yes Historical Provider, MD  diphenhydramine-acetaminophen (TYLENOL PM) 25-500 MG TABS Take 1 tablet by mouth at bedtime.    Yes Historical Provider, MD  DULoxetine (CYMBALTA) 20 MG capsule Take 1 capsule (20 mg total) by mouth daily. 01/01/14  Yes Burnis Medin, MD  gabapentin (NEURONTIN) 300 MG capsule Take 300 mg by mouth at bedtime.  12/11/13  Yes Historical Provider, MD  HYDROcodone-acetaminophen (NORCO/VICODIN) 5-325 MG per tablet Take 1 tablet by mouth at bedtime as needed for moderate pain.  06/30/13  Yes Historical Provider, MD  meloxicam (MOBIC) 15 MG tablet Take 15 mg by mouth at bedtime.   Yes Historical Provider, MD  Multiple Vitamins-Minerals (MULTIVITAMIN WITH MINERALS) tablet Take 1 tablet by mouth daily.   Yes Historical Provider, MD  OVER THE COUNTER MEDICATION Take  1 capsule by mouth daily. MEGA RED   Yes Historical Provider, MD  polyethylene glycol (MIRALAX / GLYCOLAX) packet Take 17 g by mouth daily as needed (for constipation).    Yes Historical Provider, MD  SYNTHROID 75 MCG tablet Take 1 tablet (75 mcg total) by mouth daily before breakfast. 01/01/14  Yes Burnis Medin, MD   BP 135/80  Pulse 90  Temp(Src) 97.4 F (36.3 C) (Oral)  Resp 14  Wt 86 lb (39.009 kg)  SpO2 99% Physical Exam  Nursing note and vitals reviewed. Constitutional: She appears well-developed and well-nourished. No distress.  HENT:  Head: Normocephalic and atraumatic.  Mouth/Throat:  Oropharynx is clear and moist. No oropharyngeal exudate.  Eyes: Conjunctivae and EOM are normal. Pupils are equal, round, and reactive to light. Right eye exhibits no discharge. Left eye exhibits no discharge. No scleral icterus.  Neck: Normal range of motion. Neck supple. No JVD present. No thyromegaly present.  Cardiovascular: Normal rate, regular rhythm, normal heart sounds and intact distal pulses.  Exam reveals no gallop and no friction rub.   No murmur heard. Pulmonary/Chest: Effort normal and breath sounds normal. No respiratory distress. She has no wheezes. She has no rales.  Abdominal: Soft. Bowel sounds are normal. She exhibits no distension and no mass. There is no tenderness.  Musculoskeletal: Normal range of motion. She exhibits no edema and no tenderness.  Lymphadenopathy:    She has no cervical adenopathy.  Neurological: She is alert. Coordination normal.  Some mild finger-nose-finger ataxia, some mild heel shin ataxia, some mild expressive aphasia.  Skin: Skin is warm and dry. No rash noted. No erythema.  Psychiatric: She has a normal mood and affect. Her behavior is normal.    ED Course  Procedures (including critical care time) Labs Review Labs Reviewed  CBC - Abnormal; Notable for the following:    RBC 3.43 (*)    Hemoglobin 11.6 (*)    HCT 33.5 (*)    All other components within normal limits  DIFFERENTIAL - Abnormal; Notable for the following:    Neutrophils Relative % 80 (*)    All other components within normal limits  COMPREHENSIVE METABOLIC PANEL - Abnormal; Notable for the following:    Sodium 135 (*)    Glucose, Bld 147 (*)    Calcium 8.3 (*)    Albumin 3.2 (*)    Total Bilirubin <0.2 (*)    GFR calc non Af Amer 74 (*)    GFR calc Af Amer 86 (*)    All other components within normal limits  CBG MONITORING, ED - Abnormal; Notable for the following:    Glucose-Capillary 114 (*)    All other components within normal limits  I-STAT CHEM 8, ED - Abnormal;  Notable for the following:    Sodium 136 (*)    Potassium 3.6 (*)    Glucose, Bld 149 (*)    All other components within normal limits  PROTIME-INR  APTT  I-STAT TROPOININ, ED    Imaging Review Ct Head (brain) Wo Contrast  04/30/2014   CLINICAL DATA:  68 year old female. Tremor right hand with slurred speech. Initial encounter.  EXAM: CT HEAD WITHOUT CONTRAST  TECHNIQUE: Contiguous axial images were obtained from the base of the skull through the vertex without intravenous contrast.  COMPARISON:  None.  FINDINGS: No intracranial hemorrhage.  No CT evidence of large acute infarct.  Small vessel disease type changes.  Vascular calcifications.  No hydrocephalus.  No intracranial mass lesion noted on  this unenhanced exam.  Pineal calcification probably an incidental finding.  IMPRESSION: No intracranial hemorrhage.  No CT evidence of large acute infarct.  Mild small vessel disease type changes.  Vascular calcifications.  These results were called by telephone at the time of interpretation on 04/30/2014 at 11:55 am to Dr. Leonel Ramsay, who verbally acknowledged these results.   Electronically Signed   By: Chauncey Cruel M.D.   On: 04/30/2014 12:06   Mr Brain Wo Contrast  04/30/2014   CLINICAL DATA:  Tremors right hand.  EXAM: MRI HEAD WITHOUT CONTRAST  TECHNIQUE: Multiplanar, multiecho pulse sequences of the brain and surrounding structures were obtained without intravenous contrast.  COMPARISON:  04/30/2014 MR.  FINDINGS: Coronal and axial diffusion sequence including thin-section imaging was performed to evaluate for the possibility of acute stroke. Although there are tiny regions of questionable altered signal intensity involving the posterior fossa structures, none of these could be confirmed as acute infarcts. Overall, no acute infarct is demonstrated.  Remainder of examination was truncated as per Dr. Leonel Ramsay. This includes T2 and FLAIR axial imaging.  Several punctate and patchy scattered white matter  type changes suggestive of result of small vessel disease. Involvement of the anterior left temporal lobe has been described in patients with CADASIL (cerebral autosomal dominant arteriopathy with subcortical infarcts and leukoencephalopathy). Patient has multiple risk factors for atherosclerosis and therefore changes most consistent with result of small vessel disease.  No hydrocephalus.  No intracranial mass lesion noted on this unenhanced exam.  Major intracranial vascular structures are patent.  Suggestion of tiny aneurysm lateral aspect of the left internal carotid artery super clinoid segment.  Minimal partial opacification left mastoid air cells without obstructing lesion of the eustachian tube noted.  No intracranial hemorrhage.  IMPRESSION: No discrete infarct demonstrated on current exam as noted above.  Moderate white matter type changes most likely related to result of small vessel disease.  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  Partial opacification inferior aspect left mastoid air cells.  These results were reviewed at the time of interpretation with Dr. Roland Rack .   Electronically Signed   By: Chauncey Cruel M.D.   On: 04/30/2014 13:14     EKG Interpretation   Date/Time:  Monday April 30 2014 12:06:01 EDT Ventricular Rate:  81 PR Interval:  194 QRS Duration: 101 QT Interval:  420 QTC Calculation: 487 R Axis:   50 Text Interpretation:  Sinus rhythm Consider right atrial enlargement  Borderline prolonged QT interval ECG OTHERWISE WITHIN NORMAL LIMITS since  last tracing no significant change Confirmed by Gaspard Isbell  MD, Shiniqua Groseclose ([id number])  on 04/30/2014 12:44:17 PM      MDM   Final diagnoses:  Coarse tremors  Brain aneurysm    Discussed care with neurology who is at the bedside, Dr. Leonel Ramsay is evaluating the patient at this time as well. She is currently within the three-hour window for TPA but at this time the  symptoms are very mild.  The patient now endorses taking her nighttime medications this morning instead, this is likely the source of her symptoms according to the neurologist he states that if she is back to herself by 4:00 she came to home.  3:30 PM the patient is ambulated back and forth to the bathroom several times unassisted with a stable gait and no longer has any limb ataxia or difficulty speaking. She is requesting discharge, she has been given indications for return and has expressed her  understanding.    Johnna Acosta, MD 04/30/14 [phone]

## 2014-04-30 NOTE — ED Notes (Signed)
RN of CBG 114

## 2014-05-04 ENCOUNTER — Telehealth: Payer: Self-pay | Admitting: *Deleted

## 2014-05-04 ENCOUNTER — Encounter (HOSPITAL_COMMUNITY): Payer: Self-pay | Admitting: Emergency Medicine

## 2014-05-04 ENCOUNTER — Emergency Department (HOSPITAL_COMMUNITY): Payer: Medicare Other

## 2014-05-04 ENCOUNTER — Emergency Department (HOSPITAL_COMMUNITY)
Admission: EM | Admit: 2014-05-04 | Discharge: 2014-05-04 | Disposition: A | Discharge status: Home / Self Care | Payer: Medicare Other | Attending: Emergency Medicine | Admitting: Emergency Medicine

## 2014-05-04 DIAGNOSIS — Z862 Personal history of diseases of the blood and blood-forming organs and certain disorders involving the immune mechanism: Secondary | ICD-10-CM | POA: Insufficient documentation

## 2014-05-04 DIAGNOSIS — Y9289 Other specified places as the place of occurrence of the external cause: Secondary | ICD-10-CM | POA: Diagnosis not present

## 2014-05-04 DIAGNOSIS — R4182 Altered mental status, unspecified: Secondary | ICD-10-CM

## 2014-05-04 DIAGNOSIS — Z791 Long term (current) use of non-steroidal anti-inflammatories (NSAID): Secondary | ICD-10-CM | POA: Diagnosis not present

## 2014-05-04 DIAGNOSIS — Z8719 Personal history of other diseases of the digestive system: Secondary | ICD-10-CM | POA: Diagnosis not present

## 2014-05-04 DIAGNOSIS — Z8639 Personal history of other endocrine, nutritional and metabolic disease: Secondary | ICD-10-CM | POA: Insufficient documentation

## 2014-05-04 DIAGNOSIS — R296 Repeated falls: Secondary | ICD-10-CM | POA: Insufficient documentation

## 2014-05-04 DIAGNOSIS — W19XXXA Unspecified fall, initial encounter: Secondary | ICD-10-CM

## 2014-05-04 DIAGNOSIS — Y9389 Activity, other specified: Secondary | ICD-10-CM | POA: Insufficient documentation

## 2014-05-04 DIAGNOSIS — M1990 Unspecified osteoarthritis, unspecified site: Secondary | ICD-10-CM | POA: Insufficient documentation

## 2014-05-04 DIAGNOSIS — R4789 Other speech disturbances: Secondary | ICD-10-CM

## 2014-05-04 DIAGNOSIS — Z79899 Other long term (current) drug therapy: Secondary | ICD-10-CM | POA: Diagnosis not present

## 2014-05-04 DIAGNOSIS — R42 Dizziness and giddiness: Secondary | ICD-10-CM | POA: Diagnosis present

## 2014-05-04 DIAGNOSIS — Z043 Encounter for examination and observation following other accident: Secondary | ICD-10-CM | POA: Diagnosis not present

## 2014-05-04 DIAGNOSIS — Z872 Personal history of diseases of the skin and subcutaneous tissue: Secondary | ICD-10-CM | POA: Diagnosis not present

## 2014-05-04 DIAGNOSIS — Z87891 Personal history of nicotine dependence: Secondary | ICD-10-CM | POA: Diagnosis not present

## 2014-05-04 DIAGNOSIS — R269 Unspecified abnormalities of gait and mobility: Secondary | ICD-10-CM | POA: Diagnosis not present

## 2014-05-04 LAB — COMPREHENSIVE METABOLIC PANEL
ALBUMIN: 3.4 g/dL — AB (ref 3.5–5.2)
ALT: 8 U/L (ref 0–35)
ANION GAP: 14 (ref 5–15)
AST: 18 U/L (ref 0–37)
Alkaline Phosphatase: 86 U/L (ref 39–117)
BUN: 8 mg/dL (ref 6–23)
CALCIUM: 8.9 mg/dL (ref 8.4–10.5)
CHLORIDE: 99 meq/L (ref 96–112)
CO2: 24 mEq/L (ref 19–32)
CREATININE: 0.86 mg/dL (ref 0.50–1.10)
GFR calc Af Amer: 80 mL/min — ABNORMAL LOW (ref >90)
GFR calc non Af Amer: 69 mL/min — ABNORMAL LOW (ref >90)
Glucose, Bld: 94 mg/dL (ref 70–99)
Potassium: 4 mEq/L (ref 3.7–5.3)
Sodium: 137 mEq/L (ref 137–147)
Total Protein: 6.6 g/dL (ref 6.0–8.3)

## 2014-05-04 LAB — CBC WITH DIFFERENTIAL/PLATELET
Basophils Absolute: 0 10*3/uL (ref 0.0–0.1)
Basophils Relative: 0 % (ref 0–1)
Eosinophils Absolute: 0.2 10*3/uL (ref 0.0–0.7)
Eosinophils Relative: 3 % (ref 0–5)
HCT: 35.7 % — ABNORMAL LOW (ref 36.0–46.0)
HEMOGLOBIN: 12.1 g/dL (ref 12.0–15.0)
Lymphocytes Relative: 20 % (ref 12–46)
Lymphs Abs: 1.6 10*3/uL (ref 0.7–4.0)
MCH: 32.4 pg (ref 26.0–34.0)
MCHC: 33.9 g/dL (ref 30.0–36.0)
MCV: 95.7 fL (ref 78.0–100.0)
MONO ABS: 0.7 10*3/uL (ref 0.1–1.0)
MONOS PCT: 9 % (ref 3–12)
NEUTROS ABS: 5.3 10*3/uL (ref 1.7–7.7)
Neutrophils Relative %: 68 % (ref 43–77)
Platelets: 254 10*3/uL (ref 150–400)
RBC: 3.73 MIL/uL — ABNORMAL LOW (ref 3.87–5.11)
RDW: 12.7 % (ref 11.5–15.5)
WBC: 7.9 10*3/uL (ref 4.0–10.5)

## 2014-05-04 LAB — TROPONIN I: Troponin I: <0.3 ng/mL (ref <0.30)

## 2014-05-04 LAB — RAPID URINE DRUG SCREEN, HOSP PERFORMED
Amphetamines: NOT DETECTED
Barbiturates: NOT DETECTED
Benzodiazepines: NOT DETECTED
Cocaine: NOT DETECTED
OPIATES: NOT DETECTED
Tetrahydrocannabinol: NOT DETECTED

## 2014-05-04 LAB — URINALYSIS W MICROSCOPIC (NOT AT ARMC)
Bilirubin Urine: NEGATIVE
GLUCOSE, UA: NEGATIVE mg/dL
Hgb urine dipstick: NEGATIVE
Ketones, ur: NEGATIVE mg/dL
Nitrite: NEGATIVE
PH: 7.5 (ref 5.0–8.0)
Protein, ur: NEGATIVE mg/dL
SPECIFIC GRAVITY, URINE: 1.006 (ref 1.005–1.030)
Urobilinogen, UA: 0.2 mg/dL (ref 0.0–1.0)

## 2014-05-04 LAB — ETHANOL

## 2014-05-04 NOTE — ED Notes (Signed)
Patient states fall this am, patient states dizziness with some blurred vision, patient states some altered speech this am, stuttering to get words out

## 2014-05-04 NOTE — ED Notes (Signed)
Patient worked up for stroke like symptoms x few days ago, Dr Tomi Bamberger notified of symptoms today starting at 0530.

## 2014-05-04 NOTE — ED Provider Notes (Signed)
CSN: [id number]     Arrival date & time 05/04/14  0913 History   First MD Initiated Contact with Patient 05/04/14 [phone]     Chief Complaint  Patient presents with  . Dizziness  . Fall     (Consider location/radiation/quality/duration/timing/severity/associated sxs/prior Treatment) HPI Patient was seen in the ED 4 days ago when she was unable to walk in the morning with complaints of dizziness and difficulty standing up. Her speech was not clear. She was seen in the ED and had a CT scan of the head and MRI of the brain done which did not show any acute findings except a possible aneurysm in the left internal carotid. She was discharged from the hospital and was fine. Last night patient took her usual bedtime medications which is all of her medicine except her Synthroid at 6 PM which is her usual habit. She normally goes to bed about 8 or 8:30. Husband states he found 10/31/1927 laying face down across the bed with her feet pain on this side. He helped her to in under the covers. Patient does not recall going to bed last night. Patient got up to use the bathroom early this and was fine. Her alarm clock went off and then when she got up to get up out of bed she felt dizzy. She walked around the bed holding onto the bed, she felt like she was going to fall. She states she was seeing double. She denies spinning or a feeling of going to pass out however her legs didn't give out on her and she fell to the floor. Husband states she got up and she was able to walk into the done. She then got dressed and he drove her to work. However when she got to work he states she was "spacey". He states it wasn't exactly confusion but she just seemed to have trouble figuring out what she needed to do to get started for the day. He states she was walking fine and her speech was slightly not as clear as normal but no slurring. She denies headache, nausea, vomiting, chest pain, or shortness of breath. Patient has been having  episodes of falling for about one year. She denies any numbness or tingling in her extremities although her husband states sometimes she drops things. Patient states her blood pressure has been much higher than usual and her heart rate has been higher. Her blood pressure in the ED is 144/87 which they state is very high for her and her pulse rate was 80 which they also state is high for her.  Family history there is no family history of any neurological problems. She states her father did have a aortic abdominal aneurysm.  PCP Dr Regis Bill Dr. Nelva Bush for chronic back pain  Past Medical History  Diagnosis Date  . Hypothyroidism   . Rosacea   . GERD (gastroesophageal reflux disease)     WOULD USE OTC MEDICATION IF NEEDED  . 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 Tachycardia     PT STATES HER HEART RATE USUALLY ABOVE 100   Past Surgical History  Procedure Laterality Date  . Tonsillectomy  1953  . Breast biopsy      bilateral benign  . Bunionectomy  2003    bilateral  . Lumbar laminectomy/decompression microdiscectomy Left 03/17/2013    Procedure: LUMBAR LAMINECTOMY/DECOMPRESSION IF RECESS STENOSIS FORAMINOTOMIE OF L4 ROOT AND L5,MICRODISECTOMY  L4-5 ON LEFT;  Surgeon: Tobi Bastos, MD;  Location:  [facility];  Service: Orthopedics;  Laterality: Left;   Family History  Problem Relation Age of Onset  . Heart disease    . Pancreatic cancer Brother    History  Substance Use Topics  . Smoking status: Former Smoker -- 1.00 packs/day for 48 years    Types: Cigarettes  . Smokeless tobacco: Never Used     Comment: back to tobacco 10 per day   . 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  OB History   Grav Para Term Preterm Abortions TAB SAB Ect Mult Living   3 3             Review of Systems  All other systems reviewed and are negative.     Allergies   Review of patient's allergies indicates no known allergies.  Home Medications   Prior to Admission medications   Medication Sig Start Date End Date Taking? Authorizing Provider  amitriptyline (ELAVIL) 100 MG tablet Take 300 mg by mouth at bedtime.   Yes Historical Provider, MD  Ascorbic Acid (VITAMIN C WITH ROSE HIPS) 1000 MG tablet Take 1,000 mg by mouth daily.   Yes Historical Provider, MD  Biotin 5000 MCG TABS Take 5,000 mcg by mouth daily.    Yes Historical Provider, MD  diphenhydramine-acetaminophen (TYLENOL PM) 25-500 MG TABS Take 1 tablet by mouth at bedtime.    Yes Historical Provider, MD  DULoxetine (CYMBALTA) 20 MG capsule Take 1 capsule (20 mg total) by mouth daily. 01/01/14  Yes Burnis Medin, MD  gabapentin (NEURONTIN) 300 MG capsule Take 300 mg by mouth at bedtime.  12/11/13  Yes Historical Provider, MD  HYDROcodone-acetaminophen (NORCO/VICODIN) 5-325 MG per tablet Take 1 tablet by mouth at bedtime as needed for moderate pain.  06/30/13  Yes Historical Provider, MD  meloxicam (MOBIC) 15 MG tablet Take 15 mg by mouth at bedtime.   Yes Historical Provider, MD  Multiple Vitamins-Minerals (MULTIVITAMIN WITH MINERALS) tablet Take 1 tablet by mouth daily.   Yes Historical Provider, MD  Omega-3 Fatty Acids (FISH OIL) 1000 MG CAPS Take 1,000 mg by mouth daily.   Yes Historical Provider, MD  OVER THE COUNTER MEDICATION Take 1 capsule by mouth daily. MEGA RED   Yes Historical Provider, MD  polyethylene glycol (MIRALAX / GLYCOLAX) packet Take 17 g by mouth daily as needed (for constipation).    Yes Historical Provider, MD  SYNTHROID 75 MCG tablet Take 1 tablet (75 mcg total) by mouth daily before breakfast. 01/01/14  Yes Burnis Medin, MD   BP 148/88  Pulse 86  Temp(Src) 98.1 F (36.7 C) (Oral)  Resp 14  Wt 102 lb (46.267 kg)  SpO2 99%  Vital signs normal   Physical Exam  Nursing note and vitals reviewed. Constitutional: She is oriented to person, place, and time. She appears  well-developed and well-nourished.  Non-toxic appearance. She does not appear ill. No distress.  HENT:  Head: Normocephalic and atraumatic.  Right Ear: External ear normal.  Left Ear: External ear normal.  Nose: Nose normal. No mucosal edema or rhinorrhea.  Mouth/Throat: Oropharynx is clear and moist and mucous membranes are normal. No dental abscesses or uvula swelling.  Eyes: Conjunctivae and EOM are normal. Pupils are equal, round, and reactive to light.  Neck: Normal range of motion and full passive range of motion without pain. Neck supple.  Cardiovascular: Normal rate, regular  rhythm and normal heart sounds.  Exam reveals no gallop and no friction rub.   No murmur heard. Pulmonary/Chest: Effort normal and breath sounds normal. No respiratory distress. She has no wheezes. She has no rhonchi. She has no rales. She exhibits no tenderness and no crepitus.  Abdominal: Soft. Normal appearance and bowel sounds are normal. She exhibits no distension. There is no tenderness. There is no rebound and no guarding.  Musculoskeletal: Normal range of motion. She exhibits no edema and no tenderness.  Moves all extremities well.   Neurological: She is alert and oriented to person, place, and time. She has normal strength. No cranial nerve deficit.  Speech normal, no focal cranial nerve deficit. Pt has intact finger to nose however sometimes she seems confused with the instructions. Patient did her right leg heel-to-shin with no difficulty. However with her left leg she is very uncoordinated and had difficulty doing it, however after several times she did do it correctly.  Skin: Skin is warm, dry and intact. No rash noted. No erythema. No pallor.  Psychiatric: She has a normal mood and affect. Her speech is normal and behavior is normal. Her mood appears not anxious.    ED Course  Procedures (including critical care time) Medications - No data to display   10:50 D/W Dr Aram Beecham he will see in the  ED  Dr Aram Beecham has evaluated patient and states if her MRI is normal, she can be discharged to follow up with neurology in 2-3 weeks.  Pt and husband given results of her MRI and labs. Pt ready to be discharged.   Labs Review Results for orders placed during the hospital encounter of 05/04/14  CBC WITH DIFFERENTIAL      Result Value Ref Range   WBC 7.9  4.0 - 10.5 K/uL   RBC 3.73 (*) 3.87 - 5.11 MIL/uL   Hemoglobin 12.1  12.0 - 15.0 g/dL   HCT 35.7 (*) 36.0 - 46.0 %   MCV 95.7  78.0 - 100.0 fL   MCH 32.4  26.0 - 34.0 pg   MCHC 33.9  30.0 - 36.0 g/dL   RDW 12.7  11.5 - 15.5 %   Platelets 254  150 - 400 K/uL   Neutrophils Relative % 68  43 - 77 %   Neutro Abs 5.3  1.7 - 7.7 K/uL   Lymphocytes Relative 20  12 - 46 %   Lymphs Abs 1.6  0.7 - 4.0 K/uL   Monocytes Relative 9  3 - 12 %   Monocytes Absolute 0.7  0.1 - 1.0 K/uL   Eosinophils Relative 3  0 - 5 %   Eosinophils Absolute 0.2  0.0 - 0.7 K/uL   Basophils Relative 0  0 - 1 %   Basophils Absolute 0.0  0.0 - 0.1 K/uL  COMPREHENSIVE METABOLIC PANEL      Result Value Ref Range   Sodium 137  137 - 147 mEq/L   Potassium 4.0  3.7 - 5.3 mEq/L   Chloride 99  96 - 112 mEq/L   CO2 24  19 - 32 mEq/L   Glucose, Bld 94  70 - 99 mg/dL   BUN 8  6 - 23 mg/dL   Creatinine, Ser 0.86  0.50 - 1.10 mg/dL   Calcium 8.9  8.4 - 10.5 mg/dL   Total Protein 6.6  6.0 - 8.3 g/dL   Albumin 3.4 (*) 3.5 - 5.2 g/dL   AST 18  0 - 37 U/L  ALT 8  0 - 35 U/L   Alkaline Phosphatase 86  39 - 117 U/L   Total Bilirubin <0.2 (*) 0.3 - 1.2 mg/dL   GFR calc non Af Amer 69 (*) >90 mL/min   GFR calc Af Amer 80 (*) >90 mL/min   Anion gap 14  5 - 15  ETHANOL      Result Value Ref Range   Alcohol, Ethyl (B) <11  0 - 11 mg/dL  URINE RAPID DRUG SCREEN (HOSP PERFORMED)      Result Value Ref Range   Opiates NONE DETECTED  NONE DETECTED   Cocaine NONE DETECTED  NONE DETECTED   Benzodiazepines NONE DETECTED  NONE DETECTED   Amphetamines NONE DETECTED  NONE DETECTED    Tetrahydrocannabinol NONE DETECTED  NONE DETECTED   Barbiturates NONE DETECTED  NONE DETECTED  TROPONIN I      Result Value Ref Range   Troponin I <0.30  <0.30 ng/mL  URINALYSIS W MICROSCOPIC      Result Value Ref Range   Color, Urine YELLOW  YELLOW   APPearance CLEAR  CLEAR   Specific Gravity, Urine 1.006  1.005 - 1.030   pH 7.5  5.0 - 8.0   Glucose, UA NEGATIVE  NEGATIVE mg/dL   Hgb urine dipstick NEGATIVE  NEGATIVE   Bilirubin Urine NEGATIVE  NEGATIVE   Ketones, ur NEGATIVE  NEGATIVE mg/dL   Protein, ur NEGATIVE  NEGATIVE mg/dL   Urobilinogen, UA 0.2  0.0 - 1.0 mg/dL   Nitrite NEGATIVE  NEGATIVE   Leukocytes, UA SMALL (*) NEGATIVE   WBC, UA 3-6  <3 WBC/hpf   Bacteria, UA FEW (*) RARE   Squamous Epithelial / LPF RARE  RARE    Laboratory interpretation all normal    Imaging Review Mr Brain Wo Contrast  05/04/2014   CLINICAL DATA:  Dizziness, weakness, and speech difficulty.  EXAM: MRI HEAD WITHOUT CONTRAST  TECHNIQUE: Multiplanar, multiecho pulse sequences of the brain and surrounding structures were obtained without intravenous contrast.  COMPARISON:  04/30/2014  FINDINGS: There is no evidence of acute infarct, mass, midline shift, or extra-axial fluid collection. Small focus of remote microhemorrhage is noted in the posterior left temporal lobe. Small, scattered foci of T2 hyperintensity are again seen throughout the subcortical and deep cerebral white matter and brainstem and are nonspecific but compatible with mild to moderate chronic small vessel ischemic disease. Ventricles and sulci are within normal limits for age.  Orbits are unremarkable. Small right sphenoid sinus mucous retention cyst is noted. Trace left mastoid fluid is present. Major intracranial vascular flow voids are preserved. 2 mm outpouching from the left supraclinoid ICA is again seen and suggestive of a tiny aneurysm.  IMPRESSION: 1. No acute intracranial abnormality. 2. Mild to moderate chronic small vessel  ischemic disease. 3. 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   Electronically Signed   By: Logan Bores   On: 05/04/2014 14:40     Ct Head (brain) Wo Contrast  04/30/2014   CLINICAL DATA:  68 year old female. Tremor right hand with slurred speech. Initial encounter.Marland Kitchen  IMPRESSION: No intracranial hemorrhage.  No CT evidence of large acute infarct.  Mild small vessel disease type changes.  Vascular calcifications.  These results were called by telephone at the time of interpretation on 04/30/2014 at 11:55 am to Dr. Leonel Ramsay, who verbally acknowledged these results.   Electronically Signed   By: Chauncey Cruel M.D.   On: 04/30/2014 12:06   Mr Brain Wo Contrast  04/30/2014   CLINICAL DATA:  Tremors right hand.    IMPRESSION: No discrete infarct demonstrated on current exam as noted above.  Moderate white matter type changes most likely related to result of small vessel disease.  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  Partial opacification inferior aspect left mastoid air cells.  These results were reviewed at the time of interpretation with Dr. Roland Rack .   Electronically Signed   By: Chauncey Cruel M.D.   On: 04/30/2014 13:14    EKG Interpretation   Date/Time:  Friday May 04 2014 11:53:25 EDT Ventricular Rate:  77 PR Interval:  178 QRS Duration: 99 QT Interval:  399 QTC Calculation: 452 R Axis:   57 Text Interpretation:  Sinus rhythm Anteroseptal infarct, age indeterminate  No significant change since last tracing 30 Apr 2014 Confirmed by [facility]   MD-I, Exzavier Ruderman ([id number]) on 05/04/2014 12:41:21 PM      MDM   Final diagnoses:  Falls, initial encounter  Gait difficulty   Plan discharge  Rolland Porter, MD, Alanson Aly, MD 05/04/14 1544

## 2014-05-04 NOTE — ED Notes (Signed)
Patient in NAD at time of d/c, out with family

## 2014-05-04 NOTE — Discharge Instructions (Signed)
Make an appointment with [facility] to be evaluated further for your gait problems and falls.  Recheck if you feel worse in any way.

## 2014-05-04 NOTE — Consult Note (Signed)
NEURO HOSPITALIST CONSULT NOTE    Reason for Consult: dizziness, imbalance, speech changes, confusion  HPI:                                                                                                                                          [name] is an 68 y.o. female with a past medical history significant for hypothyroidism, GERD, OA, s/p lumbar spine surgery, comes in for further evaluation of the above stated symptoms. Her husband is at the bedside. [name] was seen in this ED approximately 1 week ago with similar symptoms and had MRI brain that was unimpressive except for a possible tiny aneurysm left internal carotid artery cavernous segment. e and was discharged home. She comes today stating that when she woke up this morning she was feeling a little bit dizzy and when she tried to walk around the house she was very off balance and " felt bad in my knees". She was able to get up by herself and husband assisted her to walk and noticed that she was having some " slow speech, her speech was not quite clear and she was confused about going to work today". He said that his wife had not facial weakness or focal arm/legs weakness. Denies HA, vertigo, double vision, difficulty swallowing, visual disturbances, CP, SOB, or palpitations. Denies bladder or bowel incontinence or impairment of consciousness at the time of her fall today. On further questioning, she indicated that she has been having balance issues and occasional falls over the past year.   Past Medical History  Diagnosis Date  . Hypothyroidism   . Rosacea   . GERD (gastroesophageal reflux disease)     WOULD USE OTC MEDICATION IF NEEDED  . 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 Tachycardia     PT STATES HER HEART RATE USUALLY ABOVE 100    Past Surgical History  Procedure Laterality Date  . Tonsillectomy  1953  . Breast biopsy      bilateral benign  . Bunionectomy  2003     bilateral  . Lumbar laminectomy/decompression microdiscectomy Left 03/17/2013    Procedure: LUMBAR LAMINECTOMY/DECOMPRESSION IF RECESS STENOSIS FORAMINOTOMIE OF L4 ROOT AND L5,MICRODISECTOMY  L4-5 ON LEFT;  Surgeon: Tobi Bastos, MD;  Location: [facility];  Service: Orthopedics;  Laterality: Left;    Family History  Problem Relation Age of Onset  . Heart disease    . Pancreatic cancer Brother     Social History:  reports that she has quit smoking. Her smoking use included Cigarettes. She has a 48 pack-year smoking history. She has never used smokeless tobacco. She reports that she drinks alcohol. She reports that she does not use illicit drugs.  No Known Allergies  MEDICATIONS:  I have reviewed the patient's current medications.   ROS:                                                                                                                                       History obtained from the patient, husband, and chart review.  General ROS: negative for - chills, fatigue, fever, night sweats, weight gain or weight loss Psychological ROS: negative for - behavioral disorder, hallucinations, memory difficulties, mood swings or suicidal ideation Ophthalmic ROS: negative for - double vision, eye pain or loss of vision ENT ROS: negative for - epistaxis, nasal discharge, oral lesions, sore throat, tinnitus or vertigo Allergy and Immunology ROS: negative for - hives or itchy/watery eyes Hematological and Lymphatic ROS: negative for - bleeding problems, bruising or swollen lymph nodes Endocrine ROS: negative for - galactorrhea, hair pattern changes, polydipsia/polyuria or temperature intolerance Respiratory ROS: negative for - cough, hemoptysis, shortness of breath or wheezing Cardiovascular ROS: negative for - chest pain, dyspnea on exertion, edema or irregular  heartbeat Gastrointestinal ROS: negative for - abdominal pain, diarrhea, hematemesis, nausea/vomiting or stool incontinence Genito-Urinary ROS: negative for - dysuria, hematuria, incontinence or urinary frequency/urgency Musculoskeletal ROS: negative for - joint swelling or muscular weakness Neurological ROS: as noted in HPI Dermatological ROS: negative for rash and skin lesion changes  Physical exam: pleasant female in no apparent distress. Blood pressure 159/88, pulse 86, temperature 98.1 F (36.7 C), temperature source Oral, weight 46.267 kg (102 lb), SpO2 99.00%. Head: normocephalic. Neck: supple, no bruits, no JVD. Cardiac: no murmurs. Lungs: clear. Abdomen: soft, no tender, no mass. Extremities: no edema. Neurologic Examination:                                                                                                      General: Mental Status: Alert, oriented, thought content appropriate.  Speech fluent without evidence of aphasia.  Able to follow 3 step commands without difficulty. Cranial Nerves: II: Discs flat bilaterally; Visual fields grossly normal, pupils equal, round, reactive to light and accommodation III,IV, VI: ptosis not present, extra-ocular motions intact bilaterally V,VII: smile symmetric, facial light touch sensation normal bilaterally VIII: hearing normal bilaterally IX,X: gag reflex present XI: bilateral shoulder shrug XII: midline tongue extension without atrophy or fasciculations  Motor: Right : Upper extremity   5/5    Left:     Upper extremity   5/5  Lower extremity   5/5     Lower extremity   5/5 Tone and bulk:normal  tone throughout; no atrophy noted Sensory: Pinprick and light touch intact throughout, bilaterally Deep Tendon Reflexes:  Right: Upper Extremity   Left: Upper extremity   biceps (C-5 to C-6) 2/4   biceps (C-5 to C-6) 2/4 tricep (C7) 2/4    triceps (C7) 2/4 Brachioradialis (C6) 2/4  Brachioradialis (C6) 2/4  Lower Extremity Lower  Extremity  quadriceps (L-2 to L-4) 2/4   quadriceps (L-2 to L-4) 2/4 Achilles (S1) 2/4   Achilles (S1) 2/4 Plantars: Right: downgoing   Left: downgoing Cerebellar: normal finger-to-nose,  normal heel-to-shin test Gait:  No ataxia CV: pulses palpable throughout   Lab Results  Component Value Date/Time   CHOL 197 07/26/2013  8:08 AM    Results for orders placed during the hospital encounter of 05/04/14 (from the past 48 hour(s))  CBC WITH DIFFERENTIAL     Status: Abnormal   Collection Time    05/04/14  9:50 AM      Result Value Ref Range   WBC 7.9  4.0 - 10.5 K/uL   RBC 3.73 (*) 3.87 - 5.11 MIL/uL   Hemoglobin 12.1  12.0 - 15.0 g/dL   HCT 35.7 (*) 36.0 - 46.0 %   MCV 95.7  78.0 - 100.0 fL   MCH 32.4  26.0 - 34.0 pg   MCHC 33.9  30.0 - 36.0 g/dL   RDW 12.7  11.5 - 15.5 %   Platelets 254  150 - 400 K/uL   Neutrophils Relative % 68  43 - 77 %   Neutro Abs 5.3  1.7 - 7.7 K/uL   Lymphocytes Relative 20  12 - 46 %   Lymphs Abs 1.6  0.7 - 4.0 K/uL   Monocytes Relative 9  3 - 12 %   Monocytes Absolute 0.7  0.1 - 1.0 K/uL   Eosinophils Relative 3  0 - 5 %   Eosinophils Absolute 0.2  0.0 - 0.7 K/uL   Basophils Relative 0  0 - 1 %   Basophils Absolute 0.0  0.0 - 0.1 K/uL  COMPREHENSIVE METABOLIC PANEL     Status: Abnormal   Collection Time    05/04/14  9:50 AM      Result Value Ref Range   Sodium 137  137 - 147 mEq/L   Potassium 4.0  3.7 - 5.3 mEq/L   Chloride 99  96 - 112 mEq/L   CO2 24  19 - 32 mEq/L   Glucose, Bld 94  70 - 99 mg/dL   BUN 8  6 - 23 mg/dL   Creatinine, Ser 0.86  0.50 - 1.10 mg/dL   Calcium 8.9  8.4 - 10.5 mg/dL   Total Protein 6.6  6.0 - 8.3 g/dL   Albumin 3.4 (*) 3.5 - 5.2 g/dL   AST 18  0 - 37 U/L   ALT 8  0 - 35 U/L   Alkaline Phosphatase 86  39 - 117 U/L   Total Bilirubin <0.2 (*) 0.3 - 1.2 mg/dL   GFR calc non Af Amer 69 (*) >90 mL/min   GFR calc Af Amer 80 (*) >90 mL/min   Comment: (NOTE)     The eGFR has been calculated using the CKD EPI  equation.     This calculation has not been validated in all clinical situations.     eGFR's persistently <90 mL/min signify possible Chronic Kidney     Disease.   Anion gap 14  5 - 15  ETHANOL     Status: None  Collection Time    05/04/14  9:50 AM      Result Value Ref Range   Alcohol, Ethyl (B) <11  0 - 11 mg/dL   Comment:            LOWEST DETECTABLE LIMIT FOR     SERUM ALCOHOL IS 11 mg/dL     FOR MEDICAL PURPOSES ONLY  TROPONIN I     Status: None   Collection Time    05/04/14  9:50 AM      Result Value Ref Range   Troponin I <0.30  <0.30 ng/mL   Comment:            Due to the release kinetics of cTnI,     a negative result within the first hours     of the onset of symptoms does not rule out     myocardial infarction with certainty.     If myocardial infarction is still suspected,     repeat the test at appropriate intervals.    No results found. Assessment/Plan: 68 y/o with dizziness, imbalance, speech changes, confusion and non focal neuro-exam. 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 In retrospect, patient offers a history of imbalance and occasional falls over the past year. Will suggest obtaining MRI brain today to exclude the unlikely possibility of an acute vascular insult posterior circulation. If MRI negative, she can be discharge home with outpatient neurology follow up in 2-3 weeks   Dorian Pod, MD 05/04/2014, 11:28 AM [facility]

## 2014-05-04 NOTE — Telephone Encounter (Signed)
8:30 Pt's husband Mr. Eppes came in the office stated his wife is in the car. Pt of Dr. Regis Bill. She was seen in the ER on 9/7 questionable stroke, little aneurysm and this morning she woke up at 5:30 and fell, tried to go to work but unable, has some confusion. Asked him if she has a headache or numbness? He said no, not that she has mentioned. Discussed with Dr. Regis Bill. She said pt needs to go back to the ED to be evaluated and needs Neurology consult due to pt fell and confusion. Told husband need to take pt to ED to be evaluated again and have Neurology consult due to pt fell and confusion, per Dr. Regis Bill. Mr Niese verbalized understanding and will take her back to [facility].

## 2014-05-14 ENCOUNTER — Other Ambulatory Visit: Payer: Self-pay | Admitting: Internal Medicine

## 2014-05-14 DIAGNOSIS — R4182 Altered mental status, unspecified: Secondary | ICD-10-CM

## 2014-05-21 ENCOUNTER — Telehealth: Payer: Self-pay | Admitting: Family Medicine

## 2014-05-21 NOTE — Telephone Encounter (Signed)
Left message on home phone for the pt to return my call.  Please make sure the patient gets an appt with neurology We referred and was seen in ed x 2 for transient mental status changes and on record reviewed no FU appts are on the books Thanks [facility] ----- Message ----- From: SYSTEM Sent: 05/04/2014 3:24 PM To: Burnis Medin, MD

## 2014-05-23 NOTE — Telephone Encounter (Signed)
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 Can only come in mon, tues or thurs. pls advise.

## 2014-05-23 NOTE — Telephone Encounter (Signed)
We can see her  Next Wednesday  For 30 minutes also for hosp FU

## 2014-05-23 NOTE — Telephone Encounter (Signed)
FYI! [facility] for the pt to return my call.  Looked at the patient's appointment desk and she is scheduled with Dr. Delice Lesch on 06/19/14.

## 2014-05-23 NOTE — Telephone Encounter (Signed)
Tuesday oct 13th block 30 minutes

## 2014-05-23 NOTE — Telephone Encounter (Signed)
Spoke with Juliann Pulse and she will schedule the pt.  Will forward message.

## 2014-05-24 NOTE — Telephone Encounter (Signed)
Done,

## 2014-06-05 ENCOUNTER — Ambulatory Visit (INDEPENDENT_AMBULATORY_CARE_PROVIDER_SITE_OTHER): Payer: Medicare Other | Admitting: Internal Medicine

## 2014-06-05 ENCOUNTER — Encounter: Payer: Self-pay | Admitting: Internal Medicine

## 2014-06-05 VITALS — BP 148/70 | Temp 98.3°F | Ht 62.0 in | Wt 103.0 lb

## 2014-06-05 DIAGNOSIS — R519 Headache, unspecified: Secondary | ICD-10-CM

## 2014-06-05 DIAGNOSIS — I10 Essential (primary) hypertension: Secondary | ICD-10-CM

## 2014-06-05 DIAGNOSIS — Z23 Encounter for immunization: Secondary | ICD-10-CM

## 2014-06-05 DIAGNOSIS — R51 Headache: Secondary | ICD-10-CM

## 2014-06-05 DIAGNOSIS — I729 Aneurysm of unspecified site: Secondary | ICD-10-CM

## 2014-06-05 DIAGNOSIS — R4182 Altered mental status, unspecified: Secondary | ICD-10-CM

## 2014-06-05 MED ORDER — LISINOPRIL 10 MG PO TABS: 10.0000 mg | ORAL_TABLET | Freq: Every day | ORAL | Status: DC

## 2014-06-05 NOTE — Progress Notes (Signed)
Pre visit review using our clinic review tool, if applicable. No additional management support is needed unless otherwise documented below in the visit note.  Chief Complaint  Patient presents with  . Hospitalization Follow-up    HPI: [name] is 68 y.o. comes in today for fu of ed visits for altered mental status. Episodes. Her neuro appt is in   10 27 with dr Delice Lesch   Sept 7th   Took pm meds in am . This includes Elavil Neurontin hydrocodone. Didn't realize it until later she felt Like drunk and couldn't think straight. As evaluated in the EDTook x ray of brain  offered an anti-stroke medication but she declined.  Had MRI of the head with a small 2 mm aneurysm no acute changes small vessel disease perhaps and slight opacity of left mastoid with no obstruction.  Then fell the Next Friday and then  Balance issue   [place] 2 times  since that time No injury and then headaches. Before this .  And ear ache left side of head . Shooting up to the left side of her head Takes ibuprofen   Hit head again.  She's not had a history of recurrent headaches.  Feels like having a nervous  Breakdown. Stresses and family gets irritable with her husband and doesn't like that.  Anxiety may be contributing. Back is still a problem but feels as if his baseline was told to take the hydrocodone at night to help with the pain. Is not taking it in the day.  negtad  ROS: See pertinent positives and negatives per HPI. Neg cp sob weakness  Other change in meds some irritability   Past Medical History  Diagnosis Date  . Hypothyroidism   . Rosacea   . GERD (gastroesophageal reflux disease)     WOULD USE OTC MEDICATION IF NEEDED  . 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 Tachycardia     PT STATES HER HEART RATE USUALLY ABOVE 100    Family History  Problem Relation Age of Onset  . Heart disease    . Pancreatic cancer Brother     History   Social History  . Marital Status: Married   Spouse Name: N/A    Number of Children: N/A  . Years of Education: N/A   Social History Main Topics  . Smoking status: Former Smoker -- 1.00 packs/day for 48 years    Types: Cigarettes  . Smokeless tobacco: Never Used     Comment: back to tobacco 10 per day   . Alcohol Use: Yes  . Drug Use: No  . Sexual Activity: None   Other Topics Concern  . 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    No health insurance self employed  Now on medicare    2 dogs     Outpatient Encounter Prescriptions as of 06/05/2014  Medication Sig  . amitriptyline (ELAVIL) 100 MG tablet Take 300 mg by mouth at bedtime.  . Ascorbic Acid (VITAMIN C WITH ROSE HIPS) 1000 MG tablet Take 1,000 mg by mouth daily.  . Biotin 5000 MCG TABS Take 5,000 mcg by mouth daily.   Marland Kitchen gabapentin (NEURONTIN) 300 MG capsule Take 300 mg by mouth at bedtime.   Marland Kitchen HYDROcodone-acetaminophen (NORCO/VICODIN) 5-325 MG per  tablet Take 1 tablet by mouth at bedtime as needed for moderate pain.   . Multiple Vitamins-Minerals (MULTIVITAMIN WITH MINERALS) tablet Take 1 tablet by mouth daily.  . Omega-3 Fatty Acids (FISH OIL) 1000 MG CAPS Take 1,000 mg by mouth daily.  Marland Kitchen OVER THE COUNTER MEDICATION Take 1 capsule by mouth daily. MEGA RED  . polyethylene glycol (MIRALAX / GLYCOLAX) packet Take 17 g by mouth daily as needed (for constipation).   . SYNTHROID 75 MCG tablet Take 1 tablet (75 mcg total) by mouth daily before breakfast.  . [DISCONTINUED] diphenhydramine-acetaminophen (TYLENOL PM) 25-500 MG TABS Take 1 tablet by mouth at bedtime.   . [DISCONTINUED] DULoxetine (CYMBALTA) 20 MG capsule Take 1 capsule (20 mg total) by mouth daily.  . [DISCONTINUED] meloxicam (MOBIC) 15 MG tablet Take 15 mg by mouth at bedtime.  Marland Kitchen lisinopril (PRINIVIL,ZESTRIL) 10 MG tablet Take 1 tablet (10 mg total) by mouth daily.     EXAM:  BP 148/70  Temp(Src) 98.3 F (36.8 C) (Oral)  Ht 5\' 2"  (1.575 m)  Wt 103 lb (46.72 kg)  BMI 18.83 kg/m2  Body mass index is 18.83 kg/(m^2).  GENERAL: vitals reviewed and listed above, alert, oriented, appears well hydrated and in no acute distress HEENT: atraumatic, conjunctiva  clear, no obvious abnormalities on inspection of external nose and ears TMs are clear negative mastoid tenderness EOMs appear to be full OP : no lesion edema or exudate tongue is midline negative TMJ pain NECK: no obvious masses on inspection palpation  LUNGS: clear to auscultation bilaterally, no wheezes, rales or rhonchi, good air movement CV: HRRR, no clubbing cyanosis or  peripheral edema nl cap refill repeat blood pressure elevated 148/82. MS: moves all extremities without noticeable focal  abnormality gait appears fairly normal negative Romberg cranial nerves III through XII appear grossly intact. PSYCH: pleasant and cooperative, no obvious depression or anxiety does report stress. But normal speech. Lab Results  Component Value Date   WBC 7.9 05/04/2014   HGB 12.1 05/04/2014   HCT 35.7* 05/04/2014   PLT 254 05/04/2014   GLUCOSE 94 05/04/2014   CHOL 197 07/26/2013   TRIG 94.0 07/26/2013   HDL 56.20 07/26/2013   LDLDIRECT 132.5 06/23/2010   LDLCALC 122* 07/26/2013   ALT 8 05/04/2014   AST 18 05/04/2014   NA 137 05/04/2014   K 4.0 05/04/2014   CL 99 05/04/2014   CREATININE 0.86 05/04/2014   BUN 8 05/04/2014   CO2 24 05/04/2014   TSH 0.73 02/26/2014   INR 1.04 04/30/2014   HGBA1C 5.5 06/18/2008    ASSESSMENT AND PLAN:  Discussed the following assessment and plan:  Essential hypertension - History of elevated blood pressure readings now appear to be consistently elevated although low when she went to the ED possibly from meds begin  low-doacei  Need for prophylactic vaccination and inoculation against influenza - Plan: Flu Vaccine QUAD 36+ mos PF IM (Fluarix Quad PF)  New onset headache - left   shooting  after fall episode?  Altered mental status, unspecified altered mental status type - ? related to meds  need neuro opinion about ned for furhter work up [employer] reveiw sig of small aneurysm etc.   Aneurysm - 2 mm outpouchingfrom the left supraclinoid ICA is again seen and suggestive of atiny aneurysm.uncertain significance  Uncertain cause of all sx but certainly meds could cause se  bp up today  And needs controlled .  Left sided  Shooting pain has.  BP Readings from  Last 3 Encounters:  06/05/14 148/70  05/04/14 151/71  04/30/14 145/88   risk-benefit of medicine caution discussed. She is on a number of medications that can cause CNS effects although she states this helped her pain and her sleep. Am concerned about her complaint of balance issues headaches since her fall and episode and uncertain what to make of the findings of her MRI specifically to 2 mm aneurysm and the slight opacification of the left mastoid of unclear significance clinically. Also the small vessel disease assuming to be atherosclerotic but need neurologic opinion based on her presentation and findings. rov in December as planned  consider counseling about the stresses happening.   -Patient advised to return or notify health care team  if symptoms worsen ,persist or new concerns arise.  Patient Instructions  Begin low dose of blood pressure medicine .    Because your readings are still up.  Take one a day .  consider  Getting bp monitor .   And check readings for control below  140/90  i agree that  Some of you meds could have caused the problem but not certain related to the balance issue. Will consider getting ent to see you about the mastoid air cells changes and the left sided headaches . Stress reduction techniques .  In the interim .   ROV  At you yearly .     How to Take Your Blood Pressure HOW DO I GET A BLOOD PRESSURE MACHINE?  You can buy an electronic home blood pressure machine at your local  pharmacy. Insurance will sometimes cover the cost if you have a prescription.  Ask your doctor what type of machine is best for you. There are different machines for your arm and your wrist.  If you decide to buy a machine to check your blood pressure on your arm, first check the size of your arm so you can buy the right size cuff. To check the size of your arm:   Use a measuring tape that shows both inches and centimeters.   Wrap the measuring tape around the upper-middle part of your arm. You may need someone to help you measure.   Write down your arm measurement in both inches and centimeters.   To measure your blood pressure correctly, it is important to have the right size cuff.   If your arm is up to 13 inches (up to 34 centimeters), get an adult cuff size.  If your arm is 13 to 17 inches (35 to 44 centimeters), get a large adult cuff size.    If your arm is 17 to 20 inches (45 to 52 centimeters), get an adult thigh cuff.  WHAT DO THE NUMBERS MEAN?   There are two numbers that make up your blood pressure. For example: 120/80.  The first number (120 in our example) is called the "systolic pressure." It is a measure of the pressure in your blood vessels when your heart is pumping blood.  The second number (80 in our example) is called the "diastolic pressure." It is a measure of the pressure in your blood vessels when your heart is resting between beats.  Your doctor will tell you what your blood pressure should be. WHAT SHOULD I DO BEFORE I CHECK MY BLOOD PRESSURE?   Try to rest or relax for at least 30 minutes before you check your blood pressure.  Do not smoke.  Do not have any drinks with caffeine, such as:  Soda.  Coffee.  Tea.  Check your blood pressure in a quiet room.  Sit down and stretch out your arm on a table. Keep your arm at about the level of your heart. Let your arm relax.  Make sure that your legs are not crossed. HOW DO I CHECK MY BLOOD  PRESSURE?  Follow the directions that came with your machine.  Make sure you remove any tight-fighting clothing from your arm or wrist. Wrap the cuff around your upper arm or wrist. You should be able to fit a finger between the cuff and your arm. If you cannot fit a finger between the cuff and your arm, it is too tight and should be removed and rewrapped.  Some units require you to manually pump up the arm cuff.  Automatic units inflate the cuff when you press a button.  Cuff deflation is automatic in both models.  After the cuff is inflated, the unit measures your blood pressure and pulse. The readings are shown on a monitor. Hold still and breathe normally while the cuff is inflated.  Getting a reading takes less than a minute.  Some models store readings in a memory. Some provide a printout of readings. If your machine does not store your readings, keep a written record.  Take readings with you to your next visit with your doctor. Document Released: 07/23/2008 Document Revised: 12/25/2013 Document Reviewed: 10/05/2013 [facility] Patient Information 2015 [place], [place]. This information is not intended to replace advice given to you by your health care provider. Make sure you discuss any questions you have with your health care provider.     Total visit 53mins > 50% spent counseling and coordinating care   review expectant management and plan  medication and follow up.    Standley Brooking. Panosh M.D.  MRI brain 9 11 15  COMPARISON: 04/30/2014  FINDINGS:  There is no evidence of acute infarct, mass, midline shift, or  extra-axial fluid collection. Small focus of remote microhemorrhage  is noted in the posterior left temporal lobe. Small, scattered foci  of T2 hyperintensity are again seen throughout the subcortical and  deep cerebral white matter and brainstem and are nonspecific but  compatible with mild to moderate chronic small vessel ischemic  disease. Ventricles and sulci are  within normal limits for age.  Orbits are unremarkable. Small right sphenoid sinus mucous retention  cyst is noted. Trace left mastoid fluid is present. Major  intracranial vascular flow voids are preserved. 2 mm outpouching  from the left supraclinoid ICA is again seen and suggestive of a  tiny aneurysm.  IMPRESSION:  1. No acute intracranial abnormality.  2. 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  3. 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  Electronically Signed  By: Logan Bores  On: 05/04/2014 14:40

## 2014-06-05 NOTE — Patient Instructions (Signed)
Begin low dose of blood pressure medicine .    Because your readings are still up.  Take one a day .  consider  Getting bp monitor .   And check readings for control below  140/90  i agree that  Some of you meds could have caused the problem but not certain related to the balance issue. Will consider getting ent to see you about the mastoid air cells changes and the left sided headaches . Stress reduction techniques .  In the interim .   ROV  At you yearly .     How to Take Your Blood Pressure HOW DO I GET A BLOOD PRESSURE MACHINE?  You can buy an electronic home blood pressure machine at your local pharmacy. Insurance will sometimes cover the cost if you have a prescription.  Ask your doctor what type of machine is best for you. There are different machines for your arm and your wrist.  If you decide to buy a machine to check your blood pressure on your arm, first check the size of your arm so you can buy the right size cuff. To check the size of your arm:   Use a measuring tape that shows both inches and centimeters.   Wrap the measuring tape around the upper-middle part of your arm. You may need someone to help you measure.   Write down your arm measurement in both inches and centimeters.   To measure your blood pressure correctly, it is important to have the right size cuff.   If your arm is up to 13 inches (up to 34 centimeters), get an adult cuff size.  If your arm is 13 to 17 inches (35 to 44 centimeters), get a large adult cuff size.    If your arm is 17 to 20 inches (45 to 52 centimeters), get an adult thigh cuff.  WHAT DO THE NUMBERS MEAN?   There are two numbers that make up your blood pressure. For example: 120/80.  The first number (120 in our example) is called the "systolic pressure." It is a measure of the pressure in your blood vessels when your heart is pumping blood.  The second number (80 in our example) is called the "diastolic pressure." It is a  measure of the pressure in your blood vessels when your heart is resting between beats.  Your doctor will tell you what your blood pressure should be. WHAT SHOULD I DO BEFORE I CHECK MY BLOOD PRESSURE?   Try to rest or relax for at least 30 minutes before you check your blood pressure.  Do not smoke.  Do not have any drinks with caffeine, such as:  Soda.  Coffee.  Tea.  Check your blood pressure in a quiet room.  Sit down and stretch out your arm on a table. Keep your arm at about the level of your heart. Let your arm relax.  Make sure that your legs are not crossed. HOW DO I CHECK MY BLOOD PRESSURE?  Follow the directions that came with your machine.  Make sure you remove any tight-fighting clothing from your arm or wrist. Wrap the cuff around your upper arm or wrist. You should be able to fit a finger between the cuff and your arm. If you cannot fit a finger between the cuff and your arm, it is too tight and should be removed and rewrapped.  Some units require you to manually pump up the arm cuff.  Automatic units inflate the cuff when  you press a button.  Cuff deflation is automatic in both models.  After the cuff is inflated, the unit measures your blood pressure and pulse. The readings are shown on a monitor. Hold still and breathe normally while the cuff is inflated.  Getting a reading takes less than a minute.  Some models store readings in a memory. Some provide a printout of readings. If your machine does not store your readings, keep a written record.  Take readings with you to your next visit with your doctor. Document Released: 07/23/2008 Document Revised: 12/25/2013 Document Reviewed: 10/05/2013 [facility] Patient Information 2015 [place], [place]. This information is not intended to replace advice given to you by your health care provider. Make sure you discuss any questions you have with your health care provider.

## 2014-06-06 ENCOUNTER — Telehealth: Payer: Self-pay | Admitting: Internal Medicine

## 2014-06-06 NOTE — Telephone Encounter (Signed)
EMMI EMAILED  °

## 2014-06-19 ENCOUNTER — Encounter: Payer: Self-pay | Admitting: Neurology

## 2014-06-19 ENCOUNTER — Ambulatory Visit (INDEPENDENT_AMBULATORY_CARE_PROVIDER_SITE_OTHER): Payer: Medicare Other | Admitting: Neurology

## 2014-06-19 VITALS — BP 120/86 | HR 97 | Resp 14 | Ht 62.5 in | Wt 103.0 lb

## 2014-06-19 DIAGNOSIS — I671 Cerebral aneurysm, nonruptured: Secondary | ICD-10-CM

## 2014-06-19 DIAGNOSIS — R410 Disorientation, unspecified: Secondary | ICD-10-CM

## 2014-06-19 DIAGNOSIS — R519 Headache, unspecified: Secondary | ICD-10-CM

## 2014-06-19 DIAGNOSIS — R51 Headache: Secondary | ICD-10-CM

## 2014-06-19 LAB — CREATININE, SERUM: Creat: 0.79 mg/dL (ref 0.50–1.10)

## 2014-06-19 LAB — BUN: BUN: 8 mg/dL (ref 6–23)

## 2014-06-19 LAB — SEDIMENTATION RATE: Sed Rate: 12 mm/hr (ref 0–22)

## 2014-06-19 LAB — C-REACTIVE PROTEIN: CRP: <0.5 mg/dL (ref <0.60)

## 2014-06-19 NOTE — Progress Notes (Signed)
NEUROLOGY CONSULTATION NOTE  KATHERYNE GORR MRN: [id number] DOB: 09/06/1945  Referring provider: Dr. Shanon Ace Primary care provider: Dr. Shanon Ace  Reason for consult:  Episodes of confusion, new headaches, abnormal MRI   Dear Dr Regis Bill:  Thank you for your kind referral of [name] for consultation of the above symptoms. Although her history is well known to you, please allow me to reiterate it for the purpose of our medical record. The patient was accompanied to the clinic by her husband who also provides collateral information. Records and images were personally reviewed where available.  HISTORY OF PRESENT ILLNESS: This is a pleasant 68 year old right-handed woman with a history of hypothyroidism, chronic back pain, in her usual state of health until 04/30/14 when she suddenly collapsed at home and felt unwell and "not with it." She called her daughter who noted slurred speech and brought her to the Urgent Care and transferred her to [facility] ER where it was found that she had accidentally taken her evening medications of high dose amitriptyline 361m, gabapentin 3015m and hydrocodone that morning. She started feeling better and back to normal after several hours.  She had an MRI brain which I personally reviewed, no acute infarct seen. There were scattered white matter changes seen in the bilateral subcortical regions, as well as in the anterior left temporal lobe, felt to be most consistent with small vessel disease. 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 She was back to her normal self until 05/04/14 when she got out of bed and fell with no loss of consciousness.  She stood up and got ready for work, but her husband felt that she seemed confused and drove her to work. 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 He stated she was making sense and denied any gibberish speech,  but felt that "something was off."  He brought her to her PCP's office and were instructed to go to the ER, where she was again evaluated by Neurology, repeat MRI brain without contrast was unchanged, with again note of [id number] from the left supraclinoid ICA suggestive of tiny aneurysm. She again returned to baseline within a few hours. She denied any focal numbness/tingling/weakness during these episodes. Since then, she has had a constant annoying 5 to 6 over 10 headache over the left hemisphere, with stabbing pain lasting all day. She also reports pain inside her left ear, no discharge. She takes up to 4 Ibuprofen daily which helps a little. There is no associated nausea, vomiting, photo/phonophobia. She denies any prior history of headaches, no family history of headaches. She feels her vision is intermittently blurred, no diplopia. She denies any dysarthria, dysphagia, neck pain, bowel/bladder dysfunction. No further episodes of confusion in the past 6 weeks. She reports sleep is very good with the medications she takes at night.  Her husband feels she may be sleeping less, waking up earlier reporting the headaches. They deny any episodes of staring/unresponsiveness, gaps in time, olfactory/gustatory hallucinations, myoclonic jerks.  She has been on a high dose of Elavil 300m17ms for at least 8 years. She also takes low dose gabapentin 300mg46m and Norco for the back pain. She was recently started on Lisinopril for hypertension. Her husband checks her BP regularly, and reports doing orthostatic vital signs with occasional 15-point difference in her BP with position.  Laboratory  Data: Lab Results  Component Value Date   WBC 7.9 05/04/2014   HGB 12.1 05/04/2014   HCT 35.7* 05/04/2014   MCV 95.7 05/04/2014   PLT 254 05/04/2014     Chemistry      Component Value Date/Time   NA 137 05/04/2014 0950   K 4.0 05/04/2014 0950   CL 99 05/04/2014 0950   CO2 24 05/04/2014 0950   BUN 8 05/04/2014 0950    CREATININE 0.86 05/04/2014 0950      Component Value Date/Time   CALCIUM 8.9 05/04/2014 0950   CALCIUM 9.1 09/05/2007 0000   ALKPHOS 86 05/04/2014 0950   AST 18 05/04/2014 0950   ALT 8 05/04/2014 0950   BILITOT <0.2* 05/04/2014 0950     Lab Results  Component Value Date   TSH 0.73 02/26/2014    PAST MEDICAL HISTORY: Past Medical History  Diagnosis Date  . Hypothyroidism   . Rosacea   . GERD (gastroesophageal reflux disease)     WOULD USE OTC MEDICATION IF NEEDED  . 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 Tachycardia     PT STATES HER HEART RATE USUALLY ABOVE 100    PAST SURGICAL HISTORY: Past Surgical History  Procedure Laterality Date  . Tonsillectomy  1953  . Breast biopsy      bilateral benign  . Bunionectomy  2003    bilateral  . Lumbar laminectomy/decompression microdiscectomy Left 03/17/2013    Procedure: LUMBAR LAMINECTOMY/DECOMPRESSION IF RECESS STENOSIS FORAMINOTOMIE OF L4 ROOT AND L5,MICRODISECTOMY  L4-5 ON LEFT;  Surgeon: Tobi Bastos, MD;  Location: [facility];  Service: Orthopedics;  Laterality: Left;    MEDICATIONS: Current Outpatient Prescriptions on File Prior to Visit  Medication Sig Dispense Refill  . amitriptyline (ELAVIL) 100 MG tablet Take 300 mg by mouth at bedtime.      . Ascorbic Acid (VITAMIN C WITH ROSE HIPS) 1000 MG tablet Take 1,000 mg by mouth daily.      . Biotin 5000 MCG TABS Take 5,000 mcg by mouth daily.       Marland Kitchen gabapentin (NEURONTIN) 300 MG capsule Take 300 mg by mouth at bedtime.       Marland Kitchen HYDROcodone-acetaminophen (NORCO/VICODIN) 5-325 MG per tablet Take 1 tablet by mouth at bedtime as needed for moderate pain.       Marland Kitchen lisinopril (PRINIVIL,ZESTRIL) 10 MG tablet Take 1 tablet (10 mg total) by mouth daily.  90 tablet  1  . Multiple Vitamins-Minerals (MULTIVITAMIN WITH MINERALS) tablet Take 1 tablet by mouth daily.      . Omega-3 Fatty Acids (FISH OIL) 1000 MG CAPS Take 1,000 mg by mouth daily.      . polyethylene glycol (MIRALAX /  GLYCOLAX) packet Take 17 g by mouth daily as needed (for constipation).       . SYNTHROID 75 MCG tablet Take 1 tablet (75 mcg total) by mouth daily before breakfast.  90 tablet  3  . OVER THE COUNTER MEDICATION Take 1 capsule by mouth daily. MEGA RED       No current facility-administered medications on file prior to visit.    ALLERGIES: No Known Allergies  FAMILY HISTORY: Family History  Problem Relation Age of Onset  . Heart disease    . Pancreatic cancer Brother     SOCIAL HISTORY: History   Social History  . Marital Status: Married    Spouse Name: N/A    Number of Children: N/A  . Years of Education: N/A  Occupational History  . Not on file.   Social History Main Topics  . Smoking status: Current Every Day Smoker -- 1.00 packs/day    Types: Cigarettes  . Smokeless tobacco: Never Used     Comment: back to tobacco 10 per day   . Alcohol Use: Yes  . Drug Use: No  . Sexual Activity: Not on file   Other Topics Concern  . 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    No health insurance self employed  Now on medicare    2 dogs     REVIEW OF SYSTEMS: Constitutional: No fevers, chills, or sweats, no generalized fatigue, change in appetite Eyes: as above Ear, nose and throat: No hearing loss, + left ear pain, no nasal congestion, sore throat Cardiovascular: No chest pain, palpitations Respiratory:  No shortness of breath at rest or with exertion, wheezes GastrointestinaI: No nausea, vomiting, diarrhea, abdominal pain, fecal incontinence Genitourinary:  No dysuria, urinary retention or frequency Musculoskeletal:  No neck pain, +back pain Integumentary: No rash, pruritus, skin lesions Neurological: as above Psychiatric: No depression, insomnia, anxiety Endocrine: No palpitations, fatigue, diaphoresis, mood swings, change  in appetite, change in weight, increased thirst Hematologic/Lymphatic:  No anemia, purpura, petechiae. Allergic/Immunologic: no itchy/runny eyes, nasal congestion, recent allergic reactions, rashes  PHYSICAL EXAM: Filed Vitals:   06/19/14 0923  BP: 120/86  Pulse: 97  Resp: 14   General: No acute distress HEENT:  Normocephalic/atraumatic, +tenderness over the left occipital region, no temporal artery tenderness or ropiness, intact TM on right, unable to visualize on left due to earwax Eyes: Fundoscopic exam shows bilateral sharp discs, no vessel changes, exudates, or hemorrhages Neck: supple, no paraspinal tenderness, full range of motion Back: No paraspinal tenderness Heart: regular rate and rhythm Lungs: Clear to auscultation bilaterally. Vascular: No carotid bruits. Skin/Extremities: No rash, no edema Neurological Exam: Mental status: alert and oriented to person, place, and time, no dysarthria or aphasia, Fund of knowledge is appropriate.  Recent and remote memory are intact.  Attention and concentration are normal.    Able to name objects and repeat phrases. Cranial nerves: CN I: not tested CN II: pupils equal, round and reactive to light, visual fields intact, fundi unremarkable. CN III, IV, VI:  full range of motion, no nystagmus, no ptosis CN V: facial sensation intact CN VII: upper and lower face symmetric CN VIII: hearing intact to finger rub CN IX, X: gag intact, uvula midline CN XI: sternocleidomastoid and trapezius muscles intact CN XII: tongue midline Bulk & Tone: normal, no fasciculations. Motor: 5/5 throughout with no pronator drift. Sensation: intact to light touch, cold, pin, vibration and joint position sense.  No extinction to double simultaneous stimulation.  Romberg test negative Deep Tendon Reflexes: brisk +3 on both UE with +Hoffman sign on right, +2 on both LE, no ankle clonus Plantar responses: downgoing bilaterally Cerebellar: no incoordination on finger  to nose, heel to shin. No dysdiadochokinesia Gait: narrow-based and steady, able to tandem walk adequately. Tremor: none  IMPRESSION: This is a 68 year old right-handed woman with a history of hypothyroidism, chronic back pain, who had 2 episodes of confusion, one in the setting of medication ingestion, now with constant left-sided headaches. 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 A CTA head will be ordered to further evaluate this, we discussed very low annual risk of rupture in aneurysms less than 740min size. A follow-up scan will be ordered in a year. The new onset headaches may relate to recent falls versus occipital neuralgia, there is some tenderness in the left occipital region. Less likely temporal arteritis, check ESR and CRP. She also has left ear pain and will discuss this with her PCP. She knows to minimize Ibuprofen intake to 2-3 a week to avoid rebound headaches. If inflammatory markers are negative, consideration for occipital nerve block will be done. I am hesitant to increase gabapentin for pain, the episodes of confusion are most likely medication-related, she is on a high dose for weight of the amitriptyline. Would consider reducing dose of amitriptyline. If confusional episodes recur, EEG will be ordered.  She will follow-up in 2 months.  Thank you for allowing me to participate in the care of this patient. Please do not hesitate to call for any questions or concerns.   KaEllouise NewerM.D.  CC: Dr. PaRegis Bill

## 2014-06-19 NOTE — Patient Instructions (Signed)
1. Bloodwork for ESR, CRP 2. Schedule CTA head with contrast to evaluate aneurysm seen on MRI brain 3. Minimize use of Ibuprofen to 2-3 times a week to avoid rebound headaches 4. If bloodwork is unremarkable, we will plan to increase gabapentin dose to 642m at bedtime and consider occipital nerve block 5. If confusion recurs, go to ER and call our office after and we will schedule EEG 6. Follow-up in 2 months

## 2014-06-21 ENCOUNTER — Telehealth: Payer: Self-pay | Admitting: Family Medicine

## 2014-06-21 ENCOUNTER — Telehealth: Payer: Self-pay | Admitting: Neurology

## 2014-06-21 NOTE — Telephone Encounter (Signed)
I notified her of bloodwork results. She's not sure about having the injections. She wants to know if you think it's absolutely necessary & what to expect?

## 2014-06-21 NOTE — Telephone Encounter (Signed)
Appt made for 06/22/14 @ 3 PM.  Ear is very painful.  Needed that time due to work schedule.

## 2014-06-21 NOTE — Telephone Encounter (Signed)
[phone] states that she is returning your call

## 2014-06-21 NOTE — Telephone Encounter (Signed)
Pt is returning your call she states

## 2014-06-21 NOTE — Telephone Encounter (Signed)
Pt called and left a voicemail stating she seen Neuro on 06/20/14.  Neurologist said she has a lot of ear war on the left side and needs to be removed.  Will have the pt get on the schedule.

## 2014-06-22 ENCOUNTER — Ambulatory Visit (INDEPENDENT_AMBULATORY_CARE_PROVIDER_SITE_OTHER): Payer: Medicare Other | Admitting: Internal Medicine

## 2014-06-22 ENCOUNTER — Encounter: Payer: Self-pay | Admitting: Internal Medicine

## 2014-06-22 VITALS — BP 128/80 | Temp 98.4°F | Ht 62.5 in | Wt 104.0 lb

## 2014-06-22 DIAGNOSIS — H6122 Impacted cerumen, left ear: Secondary | ICD-10-CM

## 2014-06-22 DIAGNOSIS — H9202 Otalgia, left ear: Secondary | ICD-10-CM

## 2014-06-22 NOTE — Telephone Encounter (Signed)
The injections are for the headaches, they are under the skin, some patients feel better soon after, in some after a week or so. If she is still having a lot of pain, it may be helpful for her.

## 2014-06-22 NOTE — Telephone Encounter (Signed)
Called patient and explained below. She will let us know if she decides to proceed with injections.

## 2014-06-22 NOTE — Progress Notes (Signed)
Pre visit review using our clinic review tool, if applicable. No additional management support is needed unless otherwise documented below in the visit note.   Chief Complaint  Patient presents with  . Cerumen Impaction    Left ear  . Otalgia    HPI: [name] comesi n today cause of left ear bothering her and neuro noted she had wax in left ear with some ringing .  Evaluating for pain on side of head small aneurysm and cns sx that may have been a med side effect.  No uri or fever.   ROS: See pertinent positives and negatives per HPI.  Past Medical History  Diagnosis Date  . Hypothyroidism   . Rosacea   . GERD (gastroesophageal reflux disease)     WOULD USE OTC MEDICATION IF NEEDED  . 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 Tachycardia     PT STATES HER HEART RATE USUALLY ABOVE 100    Family History  Problem Relation Age of Onset  . Heart disease    . Pancreatic cancer Brother     History   Social History  . Marital Status: Married    Spouse Name: N/A    Number of Children: N/A  . Years of Education: N/A   Social History Main Topics  . Smoking status: Current Every Day Smoker -- 1.00 packs/day    Types: Cigarettes  . Smokeless tobacco: Never Used     Comment: back to tobacco 10 per day   . Alcohol Use: Yes  . Drug Use: No  . Sexual Activity: None   Other Topics Concern  . 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    No health insurance self employed  Now on medicare    2 dogs     Outpatient Encounter Prescriptions as of 06/22/2014  Medication Sig  . amitriptyline (ELAVIL) 100 MG tablet Take 300 mg by mouth at bedtime.  . Ascorbic Acid (VITAMIN C WITH ROSE HIPS) 1000 MG tablet Take 1,000 mg by mouth daily.  . Biotin 5000 MCG TABS Take 5,000 mcg by mouth daily.   Marland Kitchen gabapentin (NEURONTIN) 300 MG  capsule Take 300 mg by mouth at bedtime.   Marland Kitchen HYDROcodone-acetaminophen (NORCO/VICODIN) 5-325 MG per tablet Take 1 tablet by mouth at bedtime as needed for moderate pain.   Marland Kitchen lisinopril (PRINIVIL,ZESTRIL) 10 MG tablet Take 1 tablet (10 mg total) by mouth daily.  . Multiple Vitamins-Minerals (MULTIVITAMIN WITH MINERALS) tablet Take 1 tablet by mouth daily.  Marland Kitchen OVER THE COUNTER MEDICATION Take 1 capsule by mouth daily. MEGA RED  . polyethylene glycol (MIRALAX / GLYCOLAX) packet Take 17 g by mouth daily as needed (for constipation).   . SYNTHROID 75 MCG tablet Take 1 tablet (75 mcg total) by mouth daily before breakfast.  . meloxicam (MOBIC) 15 MG tablet   . [DISCONTINUED] Omega-3 Fatty Acids (FISH OIL) 1000 MG CAPS Take 1,000 mg by mouth daily.    EXAM:  BP 128/80  Temp(Src) 98.4 F (36.9 C) (Oral)  Ht 5' 2.5" (1.588 m)  Wt 104 lb (47.174 kg)  BMI 18.71 kg/m2  Body mass index is 18.71 kg/(m^2).  GENERAL: vitals reviewed and listed above, alert, oriented, appears well hydrated and in no acute distress HEENT: atraumatic, conjunctiva  clear, no obvious abnormalities on inspection of external nose and ears OP : no lesion edema or exudate  Left eac after irrigation tm intact mild erythema  No swelling of dc  No adenopathy  Pain around this area non focal  MS: moves all extremities without noticeable focal  abnormality PSYCH: pleasant and cooperative, no obvious depression or anxiety Reviewed not dr Delice Lesch ASSESSMENT AND PLAN:  Discussed the following assessment and plan:  Otalgia of left ear - prob referred assoc with head pain left   Excess wax in ear, left - tm ok seen after irrigation removal.  Willcall [place] if pain consider antibiotic drops i f needed. Fu end of year consider decreasing meds to dec risk of SE.  -Patient advised to return or notify health care team  if symptoms worsen ,persist or new concerns arise.  There are no Patient Instructions on file for this visit.   Standley Brooking. Panosh M.D.

## 2014-06-25 ENCOUNTER — Encounter: Payer: Self-pay | Admitting: Internal Medicine

## 2014-06-26 ENCOUNTER — Telehealth: Payer: Self-pay | Admitting: Internal Medicine

## 2014-06-26 ENCOUNTER — Other Ambulatory Visit: Payer: Medicare Other

## 2014-06-26 NOTE — Telephone Encounter (Signed)
emmi emailed °

## 2014-07-02 ENCOUNTER — Other Ambulatory Visit: Payer: Medicare Other

## 2014-07-03 ENCOUNTER — Ambulatory Visit
Admission: RE | Admit: 2014-07-03 | Discharge: 2014-07-03 | Disposition: A | Discharge status: Home / Self Care | Payer: Medicare Other | Source: Ambulatory Visit | Attending: Neurology | Admitting: Neurology

## 2014-07-03 MED ORDER — IOHEXOL 350 MG/ML SOLN
65.0000 mL | Freq: Once | INTRAVENOUS | Status: AC | PRN
  Administered 2014-07-03: 65 mL via INTRAVENOUS

## 2014-07-26 ENCOUNTER — Other Ambulatory Visit: Payer: Self-pay | Admitting: Internal Medicine

## 2014-07-26 NOTE — Telephone Encounter (Signed)
Disp 30 refill x 2

## 2014-07-26 NOTE — Telephone Encounter (Signed)
Sent to the pharmacy by e-scribe. 

## 2014-07-31 ENCOUNTER — Other Ambulatory Visit (INDEPENDENT_AMBULATORY_CARE_PROVIDER_SITE_OTHER): Payer: Medicare Other

## 2014-07-31 DIAGNOSIS — Z0000 Encounter for general adult medical examination without abnormal findings: Secondary | ICD-10-CM

## 2014-07-31 DIAGNOSIS — I10 Essential (primary) hypertension: Secondary | ICD-10-CM

## 2014-07-31 DIAGNOSIS — E039 Hypothyroidism, unspecified: Secondary | ICD-10-CM

## 2014-07-31 LAB — COMPREHENSIVE METABOLIC PANEL
ALT: 8 U/L (ref 0–35)
AST: 15 U/L (ref 0–37)
Albumin: 3.7 g/dL (ref 3.5–5.2)
Alkaline Phosphatase: 68 U/L (ref 39–117)
BILIRUBIN TOTAL: 0.5 mg/dL (ref 0.2–1.2)
BUN: 10 mg/dL (ref 6–23)
CO2: 25 meq/L (ref 19–32)
CREATININE: 0.9 mg/dL (ref 0.4–1.2)
Calcium: 8.7 mg/dL (ref 8.4–10.5)
Chloride: 104 mEq/L (ref 96–112)
GFR: 68.13 mL/min (ref >60.00)
Glucose, Bld: 88 mg/dL (ref 70–99)
Potassium: 4 mEq/L (ref 3.5–5.1)
Sodium: 137 mEq/L (ref 135–145)
Total Protein: 6.5 g/dL (ref 6.0–8.3)

## 2014-07-31 LAB — POCT URINALYSIS DIPSTICK
BILIRUBIN UA: NEGATIVE
GLUCOSE UA: NEGATIVE
Ketones, UA: NEGATIVE
Nitrite, UA: POSITIVE
Protein, UA: NEGATIVE
RBC UA: NEGATIVE
SPEC GRAV UA: 1.015
UROBILINOGEN UA: 0.2
pH, UA: 7.5

## 2014-07-31 LAB — CBC WITH DIFFERENTIAL/PLATELET
Basophils Absolute: 0.1 10*3/uL (ref 0.0–0.1)
Basophils Relative: 0.7 % (ref 0.0–3.0)
EOS ABS: 0.6 10*3/uL (ref 0.0–0.7)
Eosinophils Relative: 8.3 % — ABNORMAL HIGH (ref 0.0–5.0)
HCT: 38.4 % (ref 36.0–46.0)
HEMOGLOBIN: 12.7 g/dL (ref 12.0–15.0)
Lymphocytes Relative: 24 % (ref 12.0–46.0)
Lymphs Abs: 1.7 10*3/uL (ref 0.7–4.0)
MCHC: 33 g/dL (ref 30.0–36.0)
MCV: 99.5 fl (ref 78.0–100.0)
MONOS PCT: 6.4 % (ref 3.0–12.0)
Monocytes Absolute: 0.5 10*3/uL (ref 0.1–1.0)
NEUTROS ABS: 4.4 10*3/uL (ref 1.4–7.7)
Neutrophils Relative %: 60.6 % (ref 43.0–77.0)
Platelets: 288 10*3/uL (ref 150.0–400.0)
RBC: 3.86 Mil/uL — ABNORMAL LOW (ref 3.87–5.11)
RDW: 14.1 % (ref 11.5–15.5)
WBC: 7.2 10*3/uL (ref 4.0–10.5)

## 2014-07-31 LAB — LIPID PANEL
Cholesterol: 181 mg/dL (ref 0–200)
HDL: 61.3 mg/dL (ref >39.00)
LDL Cholesterol: 105 mg/dL — ABNORMAL HIGH (ref 0–99)
NONHDL: 119.7
Total CHOL/HDL Ratio: 3
Triglycerides: 74 mg/dL (ref 0.0–149.0)
VLDL: 14.8 mg/dL (ref 0.0–40.0)

## 2014-07-31 LAB — TSH: TSH: 1.5 u[IU]/mL (ref 0.35–4.50)

## 2014-07-31 NOTE — Addendum Note (Signed)
Addended by: Elmer Picker on: 07/31/2014 09:48 AM   Modules accepted: Orders

## 2014-08-02 MED ORDER — NITROFURANTOIN MONOHYD MACRO 100 MG PO CAPS
100.0000 mg | ORAL_CAPSULE | Freq: Two times a day (BID) | ORAL | Status: DC
Start: 2014-08-02 — End: 2015-01-29

## 2014-08-02 NOTE — Addendum Note (Signed)
Addended by: Miles Costain T on: 08/02/2014 10:03 AM   Modules accepted: Orders

## 2014-08-03 LAB — URINE CULTURE: Colony Count: >=100000

## 2014-08-07 ENCOUNTER — Other Ambulatory Visit: Payer: Self-pay | Admitting: Internal Medicine

## 2014-08-07 ENCOUNTER — Encounter: Payer: Self-pay | Admitting: Internal Medicine

## 2014-08-07 ENCOUNTER — Ambulatory Visit (INDEPENDENT_AMBULATORY_CARE_PROVIDER_SITE_OTHER): Payer: Medicare Other | Admitting: Internal Medicine

## 2014-08-07 VITALS — BP 112/78 | Temp 98.4°F | Ht 61.0 in | Wt 99.7 lb

## 2014-08-07 DIAGNOSIS — I10 Essential (primary) hypertension: Secondary | ICD-10-CM

## 2014-08-07 DIAGNOSIS — Z79899 Other long term (current) drug therapy: Secondary | ICD-10-CM

## 2014-08-07 DIAGNOSIS — Z0000 Encounter for general adult medical examination without abnormal findings: Secondary | ICD-10-CM

## 2014-08-07 DIAGNOSIS — M159 Polyosteoarthritis, unspecified: Secondary | ICD-10-CM

## 2014-08-07 DIAGNOSIS — E039 Hypothyroidism, unspecified: Secondary | ICD-10-CM

## 2014-08-07 DIAGNOSIS — G479 Sleep disorder, unspecified: Secondary | ICD-10-CM

## 2014-08-07 DIAGNOSIS — M150 Primary generalized (osteo)arthritis: Secondary | ICD-10-CM

## 2014-08-07 MED ORDER — LISINOPRIL 10 MG PO TABS: 10.0000 mg | ORAL_TABLET | Freq: Every day | ORAL | Status: DC

## 2014-08-07 MED ORDER — SYNTHROID 75 MCG PO TABS: 75.0000 ug | ORAL_TABLET | Freq: Every day | ORAL | Status: DC

## 2014-08-07 NOTE — Patient Instructions (Addendum)
Please stop tobacco again   You  can do this! Get yearly flu vaccine  No change in thyroid  Or bp medication . consideration again of decreasing dose of amytryptiline cause of   Risk of side effects  For your body weight and age.  Try 200 mg . But since it is helping you at this time will continue .   ROV in  6 months  Or as needed

## 2014-08-07 NOTE — Progress Notes (Signed)
Pre visit review using our clinic review tool, if applicable. No additional management support is needed unless otherwise documented below in the visit note.  Chief Complaint  Patient presents with  . Medicare Wellness    HPI: Patient comes in today for Preventive Medicare wellness visit . Since last visit has seen neuro about has left  Ear issue headaches  Small aneurysm 2 mm advised gabapentin control consider occ nerve block  eval neg  considieration of dec tca cause o potential side effects of high dose med  bp is doing better no se of meds.   Health Maintenance  Topic Date Due  . ZOSTAVAX  08/08/2007  . MAMMOGRAM  07/14/2011  . DEXA SCAN  08/07/2012  . INFLUENZA VACCINE  03/25/2015  . TETANUS/TDAP  05/07/2015  . COLONOSCOPY  09/27/2022  . PNEUMOCOCCAL POLYSACCHARIDE VACCINE AGE 67 AND OVER  Completed   Health Maintenance Review LIFESTYLE:  Exercise:  Back limiting  Tobacco/ETS: back to smoking 5 per day .  Alcohol:   no Sugar beverages: coffee ocass.  Sleep: with tca  Drug use: noain management  Bone density:  Over 4 years  Colonoscopy:  2014  Hearing: ok eval   Vision:  No limitations at present . Last eye check UTD  Safety:  Has smoke detector and wears seat belts.  No firearms. No excess sun exposure. Sees dentist regularly.  Falls: no  Advance directive :  Reviewed .     Memory: Felt to be good  , no concern from her or her family.  Depression: No anhedonia unusual crying or depressive symptoms  Nutrition: Eats well balanced diet; adequate calcium and vitamin D. No swallowing chewing problems.  Injury: no major injuries in the last six months.  Other healthcare providers:  Reviewed today .  Social:  Lives with spouse married. No pets.   Preventive parameters: up-to-date  Reviewed   ADLS:   There are no problems or need for assistance  driving, feeding, obtaining food, dressing, toileting and bathing, managing money using phone. She is  independent.    ROS:  GEN/ HEENT: No fever, significant weight changes sweats headaches vision problems hearing changes, CV/ PULM; No chest pain shortness of breath cough, syncope,edema  change in exercise tolerance. GI /GU: No adominal pain, vomiting, change in bowel habits. No blood in the stool. No significant GU symptoms. SKIN/HEME: ,no acute skin rashes suspicious lesions or bleeding. No lymphadenopathy, nodules, masses.  NEURO/ PSYCH:  No neurologic signs such as weakness numbness. No depression anxiety. IMM/ Allergy: No unusual infections.  Allergy .   REST of 12 system review negative except as per HPI   Past Medical History  Diagnosis Date  . Hypothyroidism   . Rosacea   . GERD (gastroesophageal reflux disease)     WOULD USE OTC MEDICATION IF NEEDED  . 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 Tachycardia     PT STATES HER HEART RATE USUALLY ABOVE 100    Family History  Problem Relation Age of Onset  . Heart disease    . Pancreatic cancer Brother     History   Social History  . Marital Status: Married    Spouse Name: N/A    Number of Children: N/A  . Years of Education: N/A   Social History Main Topics  . Smoking status: Current Every Day Smoker -- 1.00 packs/day    Types: Cigarettes  . Smokeless tobacco: Never Used     Comment: back  to tobacco 10 per day   . Alcohol Use: Yes  . Drug Use: No  . Sexual Activity: None   Other Topics Concern  . 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    No health insurance self employed  Now on medicare    2 dogs     Outpatient Encounter Prescriptions as of 08/07/2014  Medication Sig  . amitriptyline (ELAVIL) 100 MG tablet Take 300 mg by mouth at bedtime.  . Ascorbic Acid (VITAMIN C WITH ROSE HIPS) 1000 MG tablet Take 1,000 mg by mouth daily.  . Biotin 5000 MCG TABS  Take 5,000 mcg by mouth daily.   Marland Kitchen gabapentin (NEURONTIN) 300 MG capsule Take 300 mg by mouth at bedtime.   Marland Kitchen HYDROcodone-acetaminophen (NORCO/VICODIN) 5-325 MG per tablet Take 1 tablet by mouth at bedtime as needed for moderate pain.   Marland Kitchen lisinopril (PRINIVIL,ZESTRIL) 10 MG tablet Take 1 tablet (10 mg total) by mouth daily.  . meloxicam (MOBIC) 15 MG tablet TAKE 1 TABLET (15 MG TOTAL) BY MOUTH DAILY. AS NEEDED FOR PAIN  . Multiple Vitamins-Minerals (MULTIVITAMIN WITH MINERALS) tablet Take 1 tablet by mouth daily.  . nitrofurantoin, macrocrystal-monohydrate, (MACROBID) 100 MG capsule Take 1 capsule (100 mg total) by mouth 2 (two) times daily.  Marland Kitchen OVER THE COUNTER MEDICATION Take 1 capsule by mouth daily. MEGA RED  . polyethylene glycol (MIRALAX / GLYCOLAX) packet Take 17 g by mouth daily as needed (for constipation).   . SYNTHROID 75 MCG tablet Take 1 tablet (75 mcg total) by mouth daily before breakfast.  . [DISCONTINUED] lisinopril (PRINIVIL,ZESTRIL) 10 MG tablet Take 1 tablet (10 mg total) by mouth daily.  . [DISCONTINUED] SYNTHROID 75 MCG tablet Take 1 tablet (75 mcg total) by mouth daily before breakfast.  . [DISCONTINUED] meloxicam (MOBIC) 15 MG tablet     EXAM:  BP 112/78 mmHg  Temp(Src) 98.4 F (36.9 C) (Oral)  Ht 5\' 1"  (1.549 m)  Wt 99 lb 11.2 oz (45.224 kg)  BMI 18.85 kg/m2  Body mass index is 18.85 kg/(m^2).  Physical Exam: Vital signs reviewed YIF:OYDX is a well-developed well-nourished alert cooperative   who appears stated age in no acute distress.  HEENT: normocephalic atraumatic , Eyes: PERRL EOM's full, conjunctiva clear, Nares: paten,t no deformity discharge or tenderness., Ears: no deformity EAC's clear TMs with normal landmarks. Mouth: clear OP, no lesions, edema.  Moist mucous membranes. Dentition in adequate repair. NECK: supple without masses, thyromegaly or bruits. CHEST/PULM:  Clear to auscultation and percussion breath sounds equal no wheeze , rales or rhonchi.  No chest wall deformities or tenderness. CV: PMI is nondisplaced, S1 S2 no gallops, murmurs, rubs. Peripheral pulses are full without delay.No JVD .  Breast: normal by inspection . No dimpling, discharge, masses, tenderness or discharge . ABDOMEN: Bowel sounds normal nontender  No guard or rebound, no hepato splenomegal no CVA tenderness.  No hernia. Extremtities:  No clubbing cyanosis or edema, no acute joint swelling or redness no focal atrophy NEURO:  Oriented x3, cranial nerves 3-12 appear to be intact, no obvious focal weakness,gait within normal limits no abnormal reflexes or asymmetrical SKIN: No acute rashes normal turgor, color, no bruising or petechiae. PSYCH: Oriented, good eye contact, no obvious depression anxiety, cognition and judgment appear normal. LN: no cervical axillary inguinal  adenopathy No noted deficits in memory, attention, and speech.   Lab Results  Component Value Date   WBC 7.2 07/31/2014   HGB 12.7 07/31/2014   HCT 38.4 07/31/2014   PLT 288.0 07/31/2014   GLUCOSE 88 07/31/2014   CHOL 181 07/31/2014   TRIG 74.0 07/31/2014   HDL 61.30 07/31/2014   LDLDIRECT 132.5 06/23/2010   LDLCALC 105* 07/31/2014   ALT 8 07/31/2014   AST 15 07/31/2014   NA 137 07/31/2014   K 4.0 07/31/2014   CL 104 07/31/2014   CREATININE 0.9 07/31/2014   BUN 10 07/31/2014   CO2 25 07/31/2014   TSH 1.50 07/31/2014   INR 1.04 04/30/2014   HGBA1C 5.5 06/18/2008   Wt Readings from Last 3 Encounters:  08/07/14 99 lb 11.2 oz (45.224 kg)  06/22/14 104 lb (47.174 kg)  06/19/14 103 lb (46.72 kg)    ASSESSMENT AND PLAN:  Discussed the following assessment and plan:  Visit for preventive health examination  Encounter for Medicare annual wellness exam  Disturbance in sleep behavior  Medication management  Primary osteoarthritis involving multiple joints  Essential hypertension  Hypothyroidism, unspecified hypothyroidism type Stable at this time  Benefit more than risk of  the tca at this time but try to decrease dose to 200 mg  ( see neuro eval)  Low weight   increase high quality  Intake  Patient Care Team: Burnis Medin, MD as PCP - General Cindee Salt, MD as Consulting Physician (Physical Medicine and Rehabilitation) Tobi Bastos, MD as Consulting Physician (Orthopedic Surgery)  Patient Instructions  Please stop tobacco again   You  can do this! Get yearly flu vaccine  No change in thyroid  Or bp medication . consideration again of decreasing dose of amytryptiline cause of   Risk of side effects  For your body weight and age.  Try 200 mg . But since it is helping you at this time will continue .   ROV in  6 months  Or as needed    Mariann Laster K. Panosh M.D.

## 2014-08-07 NOTE — Telephone Encounter (Signed)
Do not see where [facility] is prescribing.  Will send for review.

## 2014-08-07 NOTE — Telephone Encounter (Signed)
Sent to the pharmacy by e-scribe. 

## 2014-08-15 ENCOUNTER — Telehealth: Payer: Self-pay | Admitting: Neurology

## 2014-08-15 NOTE — Telephone Encounter (Signed)
Pt resch appt from 08-20-14 to 09-03-14

## 2014-08-20 ENCOUNTER — Ambulatory Visit: Payer: Medicare Other | Admitting: Neurology

## 2014-08-28 ENCOUNTER — Telehealth: Payer: Self-pay | Admitting: Neurology

## 2014-08-28 NOTE — Telephone Encounter (Signed)
Pt moved her appt from 09-03-14 to 10-09-14

## 2014-09-03 ENCOUNTER — Ambulatory Visit: Payer: Medicare Other | Admitting: Neurology

## 2014-09-05 ENCOUNTER — Other Ambulatory Visit: Payer: Self-pay | Admitting: Internal Medicine

## 2014-09-23 ENCOUNTER — Other Ambulatory Visit: Payer: Self-pay | Admitting: Internal Medicine

## 2014-09-26 NOTE — Telephone Encounter (Signed)
Ok to refill x 3 

## 2014-09-26 NOTE — Telephone Encounter (Signed)
Sent to the pharmacy by e-scribe. 

## 2014-10-09 ENCOUNTER — Ambulatory Visit: Payer: Self-pay | Admitting: Neurology

## 2014-10-11 DIAGNOSIS — Z85828 Personal history of other malignant neoplasm of skin: Secondary | ICD-10-CM | POA: Diagnosis not present

## 2014-10-11 DIAGNOSIS — L812 Freckles: Secondary | ICD-10-CM | POA: Diagnosis not present

## 2014-10-11 DIAGNOSIS — L570 Actinic keratosis: Secondary | ICD-10-CM | POA: Diagnosis not present

## 2014-10-11 DIAGNOSIS — L821 Other seborrheic keratosis: Secondary | ICD-10-CM | POA: Diagnosis not present

## 2014-11-12 ENCOUNTER — Ambulatory Visit: Payer: Self-pay | Admitting: Neurology

## 2014-12-19 DIAGNOSIS — Z85828 Personal history of other malignant neoplasm of skin: Secondary | ICD-10-CM | POA: Diagnosis not present

## 2014-12-19 DIAGNOSIS — L821 Other seborrheic keratosis: Secondary | ICD-10-CM | POA: Diagnosis not present

## 2014-12-26 ENCOUNTER — Other Ambulatory Visit: Payer: Self-pay | Admitting: Internal Medicine

## 2015-01-09 ENCOUNTER — Other Ambulatory Visit: Payer: Self-pay | Admitting: Internal Medicine

## 2015-01-16 DIAGNOSIS — M5136 Other intervertebral disc degeneration, lumbar region: Secondary | ICD-10-CM | POA: Diagnosis not present

## 2015-01-21 ENCOUNTER — Other Ambulatory Visit: Payer: Self-pay | Admitting: Internal Medicine

## 2015-01-23 NOTE — Telephone Encounter (Signed)
Sent to the pharmacy by e-scribe.  Pt has upcoming appt on 01/29/15

## 2015-01-29 ENCOUNTER — Ambulatory Visit (INDEPENDENT_AMBULATORY_CARE_PROVIDER_SITE_OTHER): Payer: Medicare Other | Admitting: Internal Medicine

## 2015-01-29 ENCOUNTER — Encounter: Payer: Self-pay | Admitting: Internal Medicine

## 2015-01-29 VITALS — BP 110/80 | HR 84 | Temp 98.7°F | Wt 102.0 lb

## 2015-01-29 DIAGNOSIS — M4806 Spinal stenosis, lumbar region: Secondary | ICD-10-CM

## 2015-01-29 DIAGNOSIS — I10 Essential (primary) hypertension: Secondary | ICD-10-CM | POA: Diagnosis not present

## 2015-01-29 DIAGNOSIS — M48062 Spinal stenosis, lumbar region with neurogenic claudication: Secondary | ICD-10-CM

## 2015-01-29 DIAGNOSIS — Z79899 Other long term (current) drug therapy: Secondary | ICD-10-CM

## 2015-01-29 DIAGNOSIS — Z9889 Other specified postprocedural states: Secondary | ICD-10-CM

## 2015-01-29 MED ORDER — GABAPENTIN 300 MG PO CAPS: ORAL_CAPSULE | ORAL | Status: DC

## 2015-01-29 NOTE — Progress Notes (Signed)
Pre visit review using our clinic review tool, if applicable. No additional management support is needed unless otherwise documented below in the visit note.   Chief Complaint  Patient presents with  . Hypertension    HPI: [name] 69 y.o.   BP :   Up sometime  150  Last week.  Stopped hydrocodone   But leg not doing well.   Dr Nelva Bush   Injection  2 years ago .   Operation.   Same side.  Seen 3 weeks ago saw PA.   Cortisone shot.  Taking gaba at night 600 Sleep still thinks tca more help thatn not and sleepts well with this  ROS: See pertinent positives and negatives per HPI. No cp sob   Past Medical History  Diagnosis Date  . Hypothyroidism   . Rosacea   . GERD (gastroesophageal reflux disease)     WOULD USE OTC MEDICATION IF NEEDED  . 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 Tachycardia     PT STATES HER HEART RATE USUALLY ABOVE 100    Family History  Problem Relation Age of Onset  . Heart disease    . Pancreatic cancer Brother     History   Social History  . Marital Status: Married    Spouse Name: N/A  . Number of Children: N/A  . Years of Education: N/A   Social History Main Topics  . Smoking status: Current Every Day Smoker -- 1.00 packs/day    Types: Cigarettes  . Smokeless tobacco: Never Used     Comment: back to tobacco 10 per day   . Alcohol Use: Yes  . Drug Use: No  . Sexual Activity: Not on file   Other Topics Concern  . 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 getting a divorce    Married.    No health insurance self employed  Now on medicare    2 dogs     Outpatient Prescriptions Prior to Visit  Medication Sig Dispense Refill  . amitriptyline (ELAVIL) 100 MG tablet TAKE 3 TABLETS BY MOUTH EVERY DAY (Patient taking differently: TAKE 3 TABLETS BY MOUTH EVERY night) 270 tablet 3  . Ascorbic Acid (VITAMIN C WITH ROSE  HIPS) 1000 MG tablet Take 1,000 mg by mouth daily.    . Biotin 5000 MCG TABS Take 5,000 mcg by mouth daily.     . DULoxetine (CYMBALTA) 20 MG capsule TAKE 1 CAPSULE BY MOUTH DAILY. 30 capsule 3  . lisinopril (PRINIVIL,ZESTRIL) 10 MG tablet Take 1 tablet (10 mg total) by mouth daily. 90 tablet 3  . meloxicam (MOBIC) 15 MG tablet TAKE 1 TABLET (15 MG TOTAL) BY MOUTH DAILY. AS NEEDED FOR PAIN 30 tablet 2  . Multiple Vitamins-Minerals (MULTIVITAMIN WITH MINERALS) tablet Take 1 tablet by mouth daily.    Marland Kitchen OVER THE COUNTER MEDICATION Take 1 capsule by mouth daily. MEGA RED    . polyethylene glycol (MIRALAX / GLYCOLAX) packet Take 17 g by mouth daily as needed (for constipation).     . SYNTHROID 75 MCG tablet TAKE 1 TABLET BY MOUTH DAILY BEFORE BREAKFAST. 90 tablet 0  . amitriptyline (ELAVIL) 100 MG tablet Take 300 mg by mouth at bedtime.    . gabapentin (NEURONTIN) 300 MG capsule Take 300 mg by mouth at bedtime.     Marland Kitchen  HYDROcodone-acetaminophen (NORCO/VICODIN) 5-325 MG per tablet Take 1 tablet by mouth at bedtime as needed for moderate pain.     . nitrofurantoin, macrocrystal-monohydrate, (MACROBID) 100 MG capsule Take 1 capsule (100 mg total) by mouth 2 (two) times daily. 14 capsule 0   No facility-administered medications prior to visit.     EXAM:  BP 110/80 mmHg  Pulse 84  Temp(Src) 98.7 F (37.1 C) (Oral)  Wt 102 lb (46.267 kg)  Body mass index is 19.28 kg/(m^2).  GENERAL: vitals reviewed and listed above, alert, oriented, appears well hydrated and in no acute distress HEENT: atraumatic, conjunctiva  clear, no obvious abnormalities on inspection of external nose and ears NECK: no obvious masses on inspection palpation  LUNGS: clear to auscultation bilaterally, no wheezes, rales or rhonchi, good air movement CV: HRRR, no clubbing cyanosis or  peripheral edema nl cap refill  MS: moves all extremities without noticeable focal  Abnormality djd changes  PSYCH: pleasant and cooperative, no  obvious depression or anxiety Lab Results  Component Value Date   WBC 7.2 07/31/2014   HGB 12.7 07/31/2014   HCT 38.4 07/31/2014   PLT 288.0 07/31/2014   GLUCOSE 88 07/31/2014   CHOL 181 07/31/2014   TRIG 74.0 07/31/2014   HDL 61.30 07/31/2014   LDLDIRECT 132.5 06/23/2010   LDLCALC 105* 07/31/2014   ALT 8 07/31/2014   AST 15 07/31/2014   NA 137 07/31/2014   K 4.0 07/31/2014   CL 104 07/31/2014   CREATININE 0.9 07/31/2014   BUN 10 07/31/2014   CO2 25 07/31/2014   TSH 1.50 07/31/2014   INR 1.04 04/30/2014   HGBA1C 5.5 06/18/2008    ASSESSMENT AND PLAN:  Discussed the following assessment and plan:  Essential hypertension  Medication management  Spinal stenosis, lumbar region, with neurogenic claudication  History of back surgery Radicular pain  Pending injection but going out of town  Can add extra gaba in day to see if helps  And have  ramos office do further  Refills management   Is off opiates at this time  Total visit 16mns > 50% spent counseling and coordinating care as indicated in above note and in instructions to patient .  Plan around  pain ht etc sleep   Med still benefit more than risk  -Patient advised to return or notify health care team  if symptoms worsen ,persist or new concerns arise.  Patient Instructions  Ok to take  600 mg   Gabapentin at night and an extra dose 300 mg per day .   Have  Dr RNelva Bushoffice  Do further refills or adjustments   Enjoy  your vacation .  Wellness visit  In 6 months with labs at that time .  WStandley Brooking Panosh M.D.

## 2015-01-29 NOTE — Patient Instructions (Addendum)
Ok to take  600 mg   Gabapentin at night and an extra dose 300 mg per day .   Have  Dr Nelva Bush office  Do further refills or adjustments   Enjoy  your vacation .  Wellness visit  In 6 months with labs at that time .

## 2015-02-12 ENCOUNTER — Encounter: Payer: Self-pay | Admitting: Neurology

## 2015-02-12 ENCOUNTER — Ambulatory Visit (INDEPENDENT_AMBULATORY_CARE_PROVIDER_SITE_OTHER): Payer: Medicare Other | Admitting: Neurology

## 2015-02-12 VITALS — BP 100/76 | HR 84 | Resp 16 | Ht 62.5 in | Wt 104.0 lb

## 2015-02-12 DIAGNOSIS — R410 Disorientation, unspecified: Secondary | ICD-10-CM | POA: Diagnosis not present

## 2015-02-12 DIAGNOSIS — I671 Cerebral aneurysm, nonruptured: Secondary | ICD-10-CM | POA: Diagnosis not present

## 2015-02-12 NOTE — Patient Instructions (Signed)
1. Schedule repeat CTA head with contrast for November 2016 2. Call our office for any change in symptoms, otherwise follow-up in 1 year

## 2015-02-12 NOTE — Progress Notes (Signed)
NEUROLOGY FOLLOW UP OFFICE NOTE  CANDY LEVERETT [id number]  HISTORY OF PRESENT ILLNESS: I had the pleasure of seeing [name] in follow-up in the neurology clinic on 02/12/2015.  The patient was last seen 8 months ago for headaches and confusion. She is again accompanied by her husband who helps supplement the history today.  Records and images were personally reviewed where available.  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 There is a slight caudally directed and partially calcified 255mcomponent. 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 No other aneurysms seen.  Since her last visit, she reports that after her left ear was cleaned out, the headaches went away. She hardly has any more headaches. No further confusional episodes as well, which her husband again reports that occurred due to taking her evening medications in the morning. She denies any dizziness, diplopia, vision changes, focal numbness/tingling/weakness. She has chronic back pain and will be getting a cortisone injection next week. She fell recently and hit the bridge of her nose, no loss of consciousness.   HPTKW:IOXBs a pleasant [age]o RH woman with a history of hypothyroidism, chronic back pain, in her usual state of health until 04/30/14 when she suddenly collapsed at home and felt unwell and "not with it." She called her daughter who noted slurred speech and brought her to the Urgent Care and [facility] was found that she had accidentally taken her evening medications of high dose amitriptyline '300mg'$ , gabapentin '300mg'$ , and hydrocodone that morning. She started feeling better and back to normal after several hours. She  had an MRI brain which I personally reviewed, no acute infarct seen. There were scattered white matter changes seen in the bilateral subcortical regions, as well as in the anterior left temporal lobe, felt to be most consistent with small vessel disease. 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 She was back to her normal self until 05/04/14 when she got out of bed and fell with no loss of consciousness. She stood up and got ready for work, but her husband felt that she seemed confused and drove her to work. 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 He stated she was making sense and denied any gibberish speech, but felt that "something was off." He brought her to her PCP's office and were instructed to go to the ER, where she was again evaluated by Neurology, repeat MRI brain without contrast was unchanged, with again note of [id number] from the left supraclinoid ICA suggestive of tiny aneurysm. She again returned to baseline within a few hours. She denied any focal numbness/tingling/weakness during these episodes. Since then, she has had a constant annoying 5 to 6 over 10 headache over the left hemisphere, with stabbing pain lasting all day. She also reports pain inside her left ear, no discharge. She takes up to 4 Ibuprofen daily which helps a little. There is no associated nausea, vomiting, photo/phonophobia. She denies any prior history of headaches, no family history of headaches.  She has been on a high dose of Elavil '300mg'$  qhs for at least  8 years. She also takes low dose gabapentin '300mg'$  qhs and Norco for the back pain. She was recently started on Lisinopril for hypertension. Her husband checks her BP regularly, and reports doing orthostatic vital signs with occasional 15-point difference in her BP with position.  PAST MEDICAL HISTORY: Past Medical History  Diagnosis Date  .  Hypothyroidism   . Rosacea   . GERD (gastroesophageal reflux disease)     WOULD USE OTC MEDICATION IF NEEDED  . 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 Tachycardia     PT STATES HER HEART RATE USUALLY ABOVE 100    MEDICATIONS: Current Outpatient Prescriptions on File Prior to Visit  Medication Sig Dispense Refill  . amitriptyline (ELAVIL) 100 MG tablet TAKE 3 TABLETS BY MOUTH EVERY DAY (Patient taking differently: TAKE 3 TABLETS BY MOUTH EVERY night) 270 tablet 3  . Ascorbic Acid (VITAMIN C WITH ROSE HIPS) 1000 MG tablet Take 1,000 mg by mouth daily.    . Biotin 5000 MCG TABS Take 5,000 mcg by mouth daily.     . DULoxetine (CYMBALTA) 20 MG capsule TAKE 1 CAPSULE BY MOUTH DAILY. 30 capsule 3  . gabapentin (NEURONTIN) 300 MG capsule Take one in day and 2 po hs 90 capsule 1  . lisinopril (PRINIVIL,ZESTRIL) 10 MG tablet Take 1 tablet (10 mg total) by mouth daily. 90 tablet 3  . meloxicam (MOBIC) 15 MG tablet TAKE 1 TABLET (15 MG TOTAL) BY MOUTH DAILY. AS NEEDED FOR PAIN 30 tablet 2  . Multiple Vitamins-Minerals (MULTIVITAMIN WITH MINERALS) tablet Take 1 tablet by mouth daily.    . polyethylene glycol (MIRALAX / GLYCOLAX) packet Take 17 g by mouth daily as needed (for constipation).     . SYNTHROID 75 MCG tablet TAKE 1 TABLET BY MOUTH DAILY BEFORE BREAKFAST. 90 tablet 0   No current facility-administered medications on file prior to visit.    ALLERGIES: No Known Allergies  FAMILY HISTORY: Family History  Problem Relation Age of Onset  . Heart disease    . Pancreatic cancer Brother     SOCIAL HISTORY: History   Social History  . Marital Status: Married    Spouse Name: N/A  . Number of Children: N/A  . Years of Education: N/A   Occupational History  . Not on file.   Social History Main Topics  . Smoking status: Current Every Day Smoker -- 1.00 packs/day    Types: Cigarettes  . Smokeless tobacco: Never Used     Comment: back to tobacco 10 per day   . Alcohol  Use: 0.0 oz/week    0 Standard drinks or equivalent per week  . Drug Use: No  . Sexual Activity: Not on file   Other Topics Concern  . Not on file   Social History Narrative   Occupation:  Manicure pedicurist   Works about 30 hours a week to 15 hours    [place] getting a divorce    Married.    No health insurance self employed  Now on medicare    2 dogs     REVIEW OF SYSTEMS: Constitutional: No fevers, chills, or sweats, no generalized fatigue, change in appetite Eyes: No visual changes, double vision, eye pain Ear, nose and throat: No hearing loss, ear pain, nasal congestion, sore throat Cardiovascular: No chest pain, palpitations Respiratory:  No shortness of breath at rest or with exertion, wheezes GastrointestinaI: No nausea, vomiting, diarrhea, abdominal pain,  fecal incontinence Genitourinary:  No dysuria, urinary retention or frequency Musculoskeletal:  No neck pain, +back pain Integumentary: No rash, pruritus, skin lesions Neurological: as above Psychiatric: No depression, insomnia, anxiety Endocrine: No palpitations, fatigue, diaphoresis, mood swings, change in appetite, change in weight, increased thirst Hematologic/Lymphatic:  No anemia, purpura, petechiae. Allergic/Immunologic: no itchy/runny eyes, nasal congestion, recent allergic reactions, rashes  PHYSICAL EXAM: Filed Vitals:   02/12/15 0913  BP: 100/76  Pulse: 84  Resp: 16   General: No acute distress Head:  Normocephalic; well-healing wounds on the bridge of her nose Neck: supple, no paraspinal tenderness, full range of motion Heart:  Regular rate and rhythm Lungs:  Clear to auscultation bilaterally Back: No paraspinal tenderness Skin/Extremities: No rash, no edema Neurological Exam: alert and oriented to person, place, and time. No aphasia or dysarthria. Fund of knowledge is appropriate.  Recent and remote memory are intact.  Attention and concentration are normal.    Able  to name objects and repeat phrases. Cranial nerves: Pupils equal, round, reactive to light.  Fundoscopic exam unremarkable, no papilledema. Extraocular movements intact with no nystagmus. Visual fields full. Facial sensation intact. No facial asymmetry. Tongue, uvula, palate midline.  Motor: Bulk and tone normal, muscle strength 5/5 throughout with no pronator drift.  Sensation to light touch intact.  No extinction to double simultaneous stimulation.  Deep tendon reflexes 2+ throughout, toes downgoing.  Finger to nose testing intact.  Gait narrow-based and steady, able to tandem walk adequately.  Romberg negative.  IMPRESSION: This is a 69 yo RH woman with a history of hypothyroidism, chronic back pain, who had 2 episodes of confusion, one in the setting of medication ingestion, who initially presented with new onset constant left-sided headaches. The headaches had resolved after her left ear was cleaned out, per patient. No further headaches and confusional episodes since September/October. Her MRI brain did not show any acute changes, there was an incidental finding of a possible tiny 73m aneurysm in the left supraclinoid ICA. 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 Interval CTA will be done in a year, and if she remains asymptomatic, surveillance every 5 years. We again discussed low annual risk of rupture in aneurysms less than 63m71mn size. She knows to call our office for any change in symptoms and will follow-up annually.  Thank you for allowing me to participate in her care.  Please do not hesitate to call for any questions or concerns.  The duration of this appointment visit was 15 minutes of face-to-face time with the patient.  Greater than 50% of this time was spent in counseling, explanation of diagnosis, planning of further management, and coordination of care.   KarEllouise Newer.D.   CC: Dr. PanRegis Bill

## 2015-02-20 DIAGNOSIS — M5136 Other intervertebral disc degeneration, lumbar region: Secondary | ICD-10-CM | POA: Diagnosis not present

## 2015-03-13 ENCOUNTER — Telehealth: Payer: Self-pay | Admitting: Family Medicine

## 2015-03-13 DIAGNOSIS — Z1239 Encounter for other screening for malignant neoplasm of breast: Secondary | ICD-10-CM

## 2015-03-13 DIAGNOSIS — Z79891 Long term (current) use of opiate analgesic: Secondary | ICD-10-CM | POA: Diagnosis not present

## 2015-03-13 DIAGNOSIS — M5136 Other intervertebral disc degeneration, lumbar region: Secondary | ICD-10-CM | POA: Diagnosis not present

## 2015-03-13 NOTE — Telephone Encounter (Signed)
Requesting a referral for a mammogram.

## 2015-03-24 ENCOUNTER — Other Ambulatory Visit: Payer: Self-pay | Admitting: Internal Medicine

## 2015-03-25 NOTE — Telephone Encounter (Signed)
Sent to the pharmacy by e-scribe.  Pt has upcoming cpx in Dec. 2016

## 2015-03-27 ENCOUNTER — Other Ambulatory Visit: Payer: Self-pay | Admitting: Internal Medicine

## 2015-03-28 NOTE — Telephone Encounter (Signed)
Denied.  Filled on 03/25/15

## 2015-04-10 ENCOUNTER — Other Ambulatory Visit: Payer: Self-pay | Admitting: Internal Medicine

## 2015-04-11 NOTE — Telephone Encounter (Signed)
Sent to the pharmacy by e-scribe. 

## 2015-04-27 ENCOUNTER — Other Ambulatory Visit: Payer: Self-pay | Admitting: Internal Medicine

## 2015-04-30 NOTE — Telephone Encounter (Signed)
Ok to refill as requested   Sent in

## 2015-05-03 ENCOUNTER — Other Ambulatory Visit: Payer: Self-pay | Admitting: Internal Medicine

## 2015-05-03 DIAGNOSIS — Z79891 Long term (current) use of opiate analgesic: Secondary | ICD-10-CM | POA: Diagnosis not present

## 2015-05-03 DIAGNOSIS — Z4789 Encounter for other orthopedic aftercare: Secondary | ICD-10-CM | POA: Diagnosis not present

## 2015-05-03 NOTE — Telephone Encounter (Signed)
Sent to the pharmacy by e-scribe. 

## 2015-05-10 DIAGNOSIS — M5136 Other intervertebral disc degeneration, lumbar region: Secondary | ICD-10-CM | POA: Diagnosis not present

## 2015-05-17 DIAGNOSIS — M5136 Other intervertebral disc degeneration, lumbar region: Secondary | ICD-10-CM | POA: Diagnosis not present

## 2015-05-17 DIAGNOSIS — Z4789 Encounter for other orthopedic aftercare: Secondary | ICD-10-CM | POA: Diagnosis not present

## 2015-05-29 DIAGNOSIS — L570 Actinic keratosis: Secondary | ICD-10-CM | POA: Diagnosis not present

## 2015-05-29 DIAGNOSIS — L812 Freckles: Secondary | ICD-10-CM | POA: Diagnosis not present

## 2015-05-29 DIAGNOSIS — L821 Other seborrheic keratosis: Secondary | ICD-10-CM | POA: Diagnosis not present

## 2015-05-29 DIAGNOSIS — D1801 Hemangioma of skin and subcutaneous tissue: Secondary | ICD-10-CM | POA: Diagnosis not present

## 2015-05-29 DIAGNOSIS — L709 Acne, unspecified: Secondary | ICD-10-CM | POA: Diagnosis not present

## 2015-05-29 DIAGNOSIS — Z85828 Personal history of other malignant neoplasm of skin: Secondary | ICD-10-CM | POA: Diagnosis not present

## 2015-06-01 ENCOUNTER — Other Ambulatory Visit: Payer: Self-pay | Admitting: Internal Medicine

## 2015-06-03 NOTE — Telephone Encounter (Signed)
Sent to the pharmacy by e-scribe. 

## 2015-06-16 ENCOUNTER — Other Ambulatory Visit: Payer: Self-pay | Admitting: Internal Medicine

## 2015-06-19 NOTE — Telephone Encounter (Signed)
Sent to the pharmacy by e-scribe.  Has appt on 08/20/15

## 2015-06-29 ENCOUNTER — Other Ambulatory Visit: Payer: Self-pay | Admitting: Internal Medicine

## 2015-07-09 ENCOUNTER — Ambulatory Visit (HOSPITAL_COMMUNITY): Payer: Medicare Other

## 2015-07-19 ENCOUNTER — Other Ambulatory Visit: Payer: Self-pay | Admitting: Internal Medicine

## 2015-07-22 NOTE — Telephone Encounter (Signed)
Denied.  Filled on 06/19/15 for 90 days.

## 2015-07-30 ENCOUNTER — Other Ambulatory Visit: Payer: Self-pay | Admitting: Internal Medicine

## 2015-07-31 NOTE — Telephone Encounter (Signed)
Sent to the pharmacy by e-scribe.  Pt has upcoming cpx. 

## 2015-08-08 ENCOUNTER — Other Ambulatory Visit: Payer: Self-pay | Admitting: Internal Medicine

## 2015-08-08 NOTE — Telephone Encounter (Signed)
Sent to the pharmacy by e-scribe. 

## 2015-08-13 ENCOUNTER — Other Ambulatory Visit (INDEPENDENT_AMBULATORY_CARE_PROVIDER_SITE_OTHER): Payer: Medicare Other

## 2015-08-13 DIAGNOSIS — Z0000 Encounter for general adult medical examination without abnormal findings: Secondary | ICD-10-CM

## 2015-08-13 DIAGNOSIS — I10 Essential (primary) hypertension: Secondary | ICD-10-CM

## 2015-08-13 LAB — BASIC METABOLIC PANEL
BUN: 11 mg/dL (ref 6–23)
CALCIUM: 8.9 mg/dL (ref 8.4–10.5)
CO2: 29 meq/L (ref 19–32)
CREATININE: 0.92 mg/dL (ref 0.40–1.20)
Chloride: 104 mEq/L (ref 96–112)
GFR: 64.52 mL/min (ref >60.00)
GLUCOSE: 86 mg/dL (ref 70–99)
Potassium: 3.8 mEq/L (ref 3.5–5.1)
Sodium: 138 mEq/L (ref 135–145)

## 2015-08-13 LAB — LIPID PANEL
CHOL/HDL RATIO: 3
Cholesterol: 162 mg/dL (ref 0–200)
HDL: 58.9 mg/dL (ref >39.00)
LDL CALC: 82 mg/dL (ref 0–99)
NonHDL: 102.89
TRIGLYCERIDES: 102 mg/dL (ref 0.0–149.0)
VLDL: 20.4 mg/dL (ref 0.0–40.0)

## 2015-08-13 LAB — CBC WITH DIFFERENTIAL/PLATELET
BASOS ABS: 0 10*3/uL (ref 0.0–0.1)
Basophils Relative: 0.7 % (ref 0.0–3.0)
EOS ABS: 0.3 10*3/uL (ref 0.0–0.7)
Eosinophils Relative: 5 % (ref 0.0–5.0)
HCT: 37 % (ref 36.0–46.0)
Hemoglobin: 12.3 g/dL (ref 12.0–15.0)
LYMPHS ABS: 1.8 10*3/uL (ref 0.7–4.0)
Lymphocytes Relative: 27.9 % (ref 12.0–46.0)
MCHC: 33.2 g/dL (ref 30.0–36.0)
MCV: 98.3 fl (ref 78.0–100.0)
MONO ABS: 0.4 10*3/uL (ref 0.1–1.0)
Monocytes Relative: 6.7 % (ref 3.0–12.0)
NEUTROS ABS: 3.9 10*3/uL (ref 1.4–7.7)
NEUTROS PCT: 59.7 % (ref 43.0–77.0)
PLATELETS: 306 10*3/uL (ref 150.0–400.0)
RBC: 3.76 Mil/uL — ABNORMAL LOW (ref 3.87–5.11)
RDW: 13.5 % (ref 11.5–15.5)
WBC: 6.6 10*3/uL (ref 4.0–10.5)

## 2015-08-13 LAB — HEPATIC FUNCTION PANEL
ALBUMIN: 3.5 g/dL (ref 3.5–5.2)
ALK PHOS: 80 U/L (ref 39–117)
ALT: 7 U/L (ref 0–35)
AST: 13 U/L (ref 0–37)
BILIRUBIN DIRECT: 0 mg/dL (ref 0.0–0.3)
Total Bilirubin: 0.3 mg/dL (ref 0.2–1.2)
Total Protein: 6.4 g/dL (ref 6.0–8.3)

## 2015-08-13 LAB — TSH: TSH: 0.81 u[IU]/mL (ref 0.35–4.50)

## 2015-08-20 ENCOUNTER — Encounter: Payer: Self-pay | Admitting: Internal Medicine

## 2015-08-20 ENCOUNTER — Other Ambulatory Visit: Payer: Self-pay | Admitting: Internal Medicine

## 2015-08-20 ENCOUNTER — Ambulatory Visit (INDEPENDENT_AMBULATORY_CARE_PROVIDER_SITE_OTHER): Payer: Medicare Other | Admitting: Internal Medicine

## 2015-08-20 VITALS — BP 144/94 | Temp 98.2°F | Ht 61.0 in | Wt 101.5 lb

## 2015-08-20 DIAGNOSIS — E039 Hypothyroidism, unspecified: Secondary | ICD-10-CM | POA: Diagnosis not present

## 2015-08-20 DIAGNOSIS — Z9181 History of falling: Secondary | ICD-10-CM

## 2015-08-20 DIAGNOSIS — I10 Essential (primary) hypertension: Secondary | ICD-10-CM

## 2015-08-20 DIAGNOSIS — M5410 Radiculopathy, site unspecified: Secondary | ICD-10-CM

## 2015-08-20 DIAGNOSIS — Z9889 Other specified postprocedural states: Secondary | ICD-10-CM

## 2015-08-20 DIAGNOSIS — Z79899 Other long term (current) drug therapy: Secondary | ICD-10-CM

## 2015-08-20 DIAGNOSIS — Z0000 Encounter for general adult medical examination without abnormal findings: Secondary | ICD-10-CM | POA: Diagnosis not present

## 2015-08-20 DIAGNOSIS — F4323 Adjustment disorder with mixed anxiety and depressed mood: Secondary | ICD-10-CM

## 2015-08-20 NOTE — Telephone Encounter (Signed)
Sent to the pharmacy by e-scribe for 6 months per [facility].

## 2015-08-20 NOTE — Progress Notes (Signed)
Pre visit review using our clinic review tool, if applicable. No additional management support is needed unless otherwise documented below in the visit note.  Chief Complaint  Patient presents with  . Medicare Wellness    HPI: [name] 69 y.o. comes in today for Preventive Medicare wellnesscpx  visit .and Chronic disease management BP seems to be up and down creeping up  Taking low dose acei  Sleep ok 9 hours  Taking  tca reports doing well on this  And gabapentin   MS: spinal stenosis . Problematic  Doesn't have time for sugery   Has seen dr RamosDisheartened that the pain has come back and cant dor reg acitivities such as tennis etc . Down at times but  Not too stuch  Takes nsaid when needed not every day Hx of falling  More than not no cp sob syncoep dizzsiness   Poss from legs and le predicament  Thyroid no change in meds   Health Maintenance  Topic Date Due  . ZOSTAVAX  08/08/2007  . MAMMOGRAM  07/14/2011  . DEXA SCAN  08/07/2012  . TETANUS/TDAP  05/07/2015  . Hepatitis C Screening  08/13/2016 (Originally [birth year])  . INFLUENZA VACCINE  03/24/2016  . COLONOSCOPY  09/27/2022  . PNA vac Low Risk Adult  Completed   Health Maintenance Review LIFESTYLE:  TAD ocass tobacco  Has quit  Sugar beverages:n Sleep: 9 hours     MEDICARE DOCUMENT QUESTIONS  TO SCAN     Hearing: ok  Vision:  No limitations at present . Last eye check UTD  Safety:  Has smoke detector and wears seat belts.  No firearms. No excess sun exposure. Sees dentist regularly.  Falls: yes see above   Advance directive :  Reviewed   Memory:  Some lapses no serious concerns   Depression: No anhedonia unusual crying or depressive symptomsbut see above   Nutrition: Eats well balanced diet; adequate calcium and vitamin D. No swallowing chewing problems.  Injury: no major injuries in the last six months.  Other healthcare providers:  Reviewed today .  Social:  Lives with spouse married. h as  triplet granc children  Preventive parameters: up-to-date  Reviewed   ADLS:   There are no problems or need for assistance  driving, feeding, obtaining food, dressing, toileting and bathing, managing money using phone. She is independent. Works    ROS:  GEN/ HEENT: No fever, significant weight changes sweats headaches vision problems hearing changes, CV/ PULM; No chest pain shortness of breath cough, syncope,edema  change in exercise tolerance. GI /GU: No adominal pain, vomiting, change in bowel habits. No blood in the stool. No significant GU symptoms. SKIN/HEME: ,no acute skin rashes suspicious lesions or bleeding. No lymphadenopathy, nodules, masses.  NEURO/ PSYCH:   See above  IMM/ Allergy: No unusual infections.  Allergy .   REST of 12 system review negative except as per HPI   Past Medical History  Diagnosis Date  . Hypothyroidism   . Rosacea   . GERD (gastroesophageal reflux disease)     WOULD USE OTC MEDICATION IF NEEDED  . 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 Tachycardia     PT STATES HER HEART RATE USUALLY ABOVE 100    Family History  Problem Relation Age of Onset  . Heart disease    . Pancreatic cancer Brother     Social History   Social History  . Marital Status: Married    Spouse Name:  N/A  . Number of Children: N/A  . Years of Education: N/A   Social History Main Topics  . Smoking status: Current Every Day Smoker -- 1.00 packs/day    Types: Cigarettes  . Smokeless tobacco: Never Used     Comment: back to tobacco 10 per day   . Alcohol Use: 0.0 oz/week    0 Standard drinks or equivalent per week  . Drug Use: No  . Sexual Activity: Not Asked   Other Topics Concern  . None   Social History Narrative   Occupation:  Advice worker   Works about 30 hours a week to 15 hours    [place] getting a divorce    Married.    No health insurance self employed  Now on medicare    2 dogs     Outpatient  Encounter Prescriptions as of 08/20/2015  Medication Sig  . amitriptyline (ELAVIL) 100 MG tablet TAKE 3 TABLETS BY MOUTH EVERY DAY  . Ascorbic Acid (VITAMIN C WITH ROSE HIPS) 1000 MG tablet Take 1,000 mg by mouth daily.  . Biotin 5000 MCG TABS Take 5,000 mcg by mouth daily.   Marland Kitchen erythromycin ophthalmic ointment APPLY OINTMENT TO BOTH EYES AT BEDTIME  . gabapentin (NEURONTIN) 300 MG capsule TAKE ONE CAPSULE IN THE DAYTIME AND 2 CAPSULE AT BEDTIME  . lisinopril (PRINIVIL,ZESTRIL) 10 MG tablet Take 1-2 tablets (10-20 mg total) by mouth daily.  . meloxicam (MOBIC) 15 MG tablet TAKE 1 TABLET (15 MG TOTAL) BY MOUTH DAILY. AS NEEDED FOR PAIN  . Multiple Vitamins-Minerals (MULTIVITAMIN WITH MINERALS) tablet Take 1 tablet by mouth daily.  . polyethylene glycol (MIRALAX / GLYCOLAX) packet Take 17 g by mouth daily as needed (for constipation).   . SYNTHROID 75 MCG tablet TAKE 1 TABLET BY MOUTH DAILY BEFORE BREAKFAST.  Marland Kitchen tretinoin (RETIN-A) 0.025 % cream Reported on 08/20/2015  . [DISCONTINUED] DULoxetine (CYMBALTA) 20 MG capsule TAKE 1 CAPSULE BY MOUTH DAILY.  . [DISCONTINUED] lisinopril (PRINIVIL,ZESTRIL) 10 MG tablet TAKE 1 TABLET (10 MG TOTAL) BY MOUTH DAILY.   No facility-administered encounter medications on file as of 08/20/2015.    EXAM:  BP 144/94 mmHg  Temp(Src) 98.2 F (36.8 C) (Oral)  Ht 5' 1"  (1.549 m)  Wt 101 lb 8 oz (46.04 kg)  BMI 19.19 kg/m2  Body mass index is 19.19 kg/(m^2).  Physical Exam: Vital signs reviewed XLK:GMWN is a well-developed well-nourished alert cooperative   who appears stated age in no acute distress.  HEENT: normocephalic atraumatic , Eyes: PERRL EOM's full, conjunctiva clear, Nares: paten,t no deformity discharge or tenderness., Ears: no deformity EAC's clear TMs with normal landmarks. Mouth: clear OP, no lesions, edema.  Moist mucous membranes. Dentition in adequate repair. NECK: supple without masses, thyromegaly or bruits. CHEST/PULM:  Clear to  auscultation and percussion breath sounds equal no wheeze , rales or rhonchi. No chest wall deformities or tenderness.Breast: normal by inspection . No dimpling, discharge, masses, tenderness or discharge . CV: PMI is nondisplaced, S1 S2 no gallops, very slight 1-2/6 sem lsb no radiation and nl s2 s2  no rubs. Peripheral pulses are full without delay.No JVD .  ABDOMEN: Bowel sounds normal nontender  No guard or rebound, no hepato splenomegal no CVA tenderness.   Extremtities:  No clubbing cyanosis or edema, no acute joint swelling djd changes or redness no focal atrophy NEURO:  Oriented x3, cranial nerves 3-12 appear to be intact, no obvious focal weakness,gait within normal  limits no abnormal reflexes or asymmetrical SKIN: No acute rashes normal turgor, color, no bruising or petechiae. PSYCH: Oriented, good eye contact, no obvious depression anxiety, cognition and judgment appear normal. LN: no cervical axillary inguinal adenopathy No noted deficits in memory, attention, and speech.   Lab Results  Component Value Date   WBC 6.6 08/13/2015   HGB 12.3 08/13/2015   HCT 37.0 08/13/2015   PLT 306.0 08/13/2015   GLUCOSE 86 08/13/2015   CHOL 162 08/13/2015   TRIG 102.0 08/13/2015   HDL 58.90 08/13/2015   LDLDIRECT 132.5 06/23/2010   LDLCALC 82 08/13/2015   ALT 7 08/13/2015   AST 13 08/13/2015   NA 138 08/13/2015   K 3.8 08/13/2015   CL 104 08/13/2015   CREATININE 0.92 08/13/2015   BUN 11 08/13/2015   CO2 29 08/13/2015   TSH 0.81 08/13/2015   INR 1.04 04/30/2014   HGBA1C 5.5 06/18/2008    ASSESSMENT AND PLAN:  Discussed the following assessment and plan:  Visit for preventive health examination  Essential hypertension  Hx of fall - Plan: Ambulatory referral to Physical Therapy  Hypothyroidism, unspecified hypothyroidism type  Encounter for Medicare annual wellness exam  Radicular pain of left lower extremity  Adjustment reaction with anxiety and depression - disc counsel  may benefit from counesling about losses from health issues  History of back surgery  Medication management caution with meds   Risk benefit of medication discussed. tca and nsaids  6 monthr mr vhrvk ig on nsiads  dotn go back to tobacco ... ocass use  Disc counseling as opposed to more medication. Patient Care Team: Burnis Medin, MD as PCP - General Suella Broad, MD as Consulting Physician (Physical Medicine and Rehabilitation) Latanya Maudlin, MD as Consulting Physician (Orthopedic Surgery)  Patient Instructions  Increase the lisinopril to 20 mg per day for bp control .   Labs are good .   Will plan referral to  Pt for fall prevention.  Goal is below  140/90 . But  If goes to 100 and below and causes light headed ness then  Contact us fpor fu.  Consider  Talking to counselor about the losses  As discussed.  Get a mammogram  [facility] or breast center.    Health Maintenance, Female Adopting a healthy lifestyle and getting preventive care can go a long way to promote health and wellness. Talk with your health care provider about what schedule of regular examinations is right for you. This is a good chance for you to check in with your provider about disease prevention and staying healthy. In between checkups, there are plenty of things you can do on your own. Experts have done a lot of research about which lifestyle changes and preventive measures are most likely to keep you healthy. Ask your health care provider for more information. WEIGHT AND DIET  Eat a healthy diet  Be sure to include plenty of vegetables, fruits, low-fat dairy products, and lean protein.  Do not eat a lot of foods high in solid fats, added sugars, or salt.  Get regular exercise. This is one of the most important things you can do for your health.  Most adults should exercise for at least 150 minutes each week. The exercise should increase your heart rate and make you sweat (moderate-intensity  exercise).  Most adults should also do strengthening exercises at least twice a week. This is in addition to the moderate-intensity exercise.  Maintain a healthy weight  Body mass index (  BMI) is a measurement that can be used to identify possible weight problems. It estimates body fat based on height and weight. Your health care provider can help determine your BMI and help you achieve or maintain a healthy weight.  For females 42 years of age and older:   A BMI below 18.5 is considered underweight.  A BMI of 18.5 to 24.9 is normal.  A BMI of 25 to 29.9 is considered overweight.  A BMI of 30 and above is considered obese.  Watch levels of cholesterol and blood lipids  You should start having your blood tested for lipids and cholesterol at 68 years of age, then have this test every 5 years.  You may need to have your cholesterol levels checked more often if:  Your lipid or cholesterol levels are high.  You are older than 68 years of age.  You are at high risk for heart disease.  CANCER SCREENING   Lung Cancer  Lung cancer screening is recommended for adults 25-43 years old who are at high risk for lung cancer because of a history of smoking.  A yearly low-dose CT scan of the lungs is recommended for people who:  Currently smoke.  Have quit within the past 15 years.  Have at least a 30-pack-year history of smoking. A pack year is smoking an average of one pack of cigarettes a day for 1 year.  Yearly screening should continue until it has been 15 years since you quit.  Yearly screening should stop if you develop a health problem that would prevent you from having lung cancer treatment.  Breast Cancer  Practice breast self-awareness. This means understanding how your breasts normally appear and feel.  It also means doing regular breast self-exams. Let your health care provider know about any changes, no matter how small.  If you are in your 20s or 30s, you should  have a clinical breast exam (CBE) by a health care provider every 1-3 years as part of a regular health exam.  If you are 9 or older, have a CBE every year. Also consider having a breast X-ray (mammogram) every year.  If you have a family history of breast cancer, talk to your health care provider about genetic screening.  If you are at high risk for breast cancer, talk to your health care provider about having an MRI and a mammogram every year.  Breast cancer gene (BRCA) assessment is recommended for women who have family members with BRCA-related cancers. BRCA-related cancers include:  Breast.  Ovarian.  Tubal.  Peritoneal cancers.  Results of the assessment will determine the need for genetic counseling and BRCA1 and BRCA2 testing. Cervical Cancer Your health care provider may recommend that you be screened regularly for cancer of the pelvic organs (ovaries, uterus, and vagina). This screening involves a pelvic examination, including checking for microscopic changes to the surface of your cervix (Pap test). You may be encouraged to have this screening done every 3 years, beginning at age 38.  For women ages 32-65, health care providers may recommend pelvic exams and Pap testing every 3 years, or they may recommend the Pap and pelvic exam, combined with testing for human papilloma virus (HPV), every 5 years. Some types of HPV increase your risk of cervical cancer. Testing for HPV may also be done on women of any age with unclear Pap test results.  Other health care providers may not recommend any screening for nonpregnant women who are considered low risk for pelvic  cancer and who do not have symptoms. Ask your health care provider if a screening pelvic exam is right for you.  If you have had past treatment for cervical cancer or a condition that could lead to cancer, you need Pap tests and screening for cancer for at least 20 years after your treatment. If Pap tests have been  discontinued, your risk factors (such as having a new sexual partner) need to be reassessed to determine if screening should resume. Some women have medical problems that increase the chance of getting cervical cancer. In these cases, your health care provider may recommend more frequent screening and Pap tests. Colorectal Cancer  This type of cancer can be detected and often prevented.  Routine colorectal cancer screening usually begins at 68 years of age and continues through 68 years of age.  Your health care provider may recommend screening at an earlier age if you have risk factors for colon cancer.  Your health care provider may also recommend using home test kits to check for hidden blood in the stool.  A small camera at the end of a tube can be used to examine your colon directly (sigmoidoscopy or colonoscopy). This is done to check for the earliest forms of colorectal cancer.  Routine screening usually begins at age 17.  Direct examination of the colon should be repeated every 5-10 years through 68 years of age. However, you may need to be screened more often if early forms of precancerous polyps or small growths are found. Skin Cancer  Check your skin from head to toe regularly.  Tell your health care provider about any new moles or changes in moles, especially if there is a change in a mole's shape or color.  Also tell your health care provider if you have a mole that is larger than the size of a pencil eraser.  Always use sunscreen. Apply sunscreen liberally and repeatedly throughout the day.  Protect yourself by wearing long sleeves, pants, a wide-brimmed hat, and sunglasses whenever you are outside. HEART DISEASE, DIABETES, AND HIGH BLOOD PRESSURE   High blood pressure causes heart disease and increases the risk of stroke. High blood pressure is more likely to develop in:  People who have blood pressure in the high end of the normal range (130-139/85-89 mm Hg).  People  who are overweight or obese.  People who are African American.  If you are 26-51 years of age, have your blood pressure checked every 3-5 years. If you are 52 years of age or older, have your blood pressure checked every year. You should have your blood pressure measured twice--once when you are at a hospital or clinic, and once when you are not at a hospital or clinic. Record the average of the two measurements. To check your blood pressure when you are not at a hospital or clinic, you can use:  An automated blood pressure machine at a pharmacy.  A home blood pressure monitor.  If you are between 68 years and 47 years old, ask your health care provider if you should take aspirin to prevent strokes.  Have regular diabetes screenings. This involves taking a blood sample to check your fasting blood sugar level.  If you are at a normal weight and have a low risk for diabetes, have this test once every three years after 68 years of age.  If you are overweight and have a high risk for diabetes, consider being tested at a younger age or more often. PREVENTING INFECTION  Hepatitis B  If you have a higher risk for hepatitis B, you should be screened for this virus. You are considered at high risk for hepatitis B if:  You were born in a country where hepatitis B is common. Ask your health care provider which countries are considered high risk.  Your parents were born in a high-risk country, and you have not been immunized against hepatitis B (hepatitis B vaccine).  You have HIV or AIDS.  You use needles to inject street drugs.  You live with someone who has hepatitis B.  You have had sex with someone who has hepatitis B.  You get hemodialysis treatment.  You take certain medicines for conditions, including cancer, organ transplantation, and autoimmune conditions. Hepatitis C  Blood testing is recommended for:  Everyone born from [birth year] through 1964.  Anyone with known risk factors for  hepatitis C. Sexually transmitted infections (STIs)  You should be screened for sexually transmitted infections (STIs) including gonorrhea and chlamydia if:  You are sexually active and are younger than 68 years of age.  You are older than 68 years of age and your health care provider tells you that you are at risk for this type of infection.  Your sexual activity has changed since you were last screened and you are at an increased risk for chlamydia or gonorrhea. Ask your health care provider if you are at risk.  If you do not have HIV, but are at risk, it may be recommended that you take a prescription medicine daily to prevent HIV infection. This is called pre-exposure prophylaxis (PrEP). You are considered at risk if:  You are sexually active and do not regularly use condoms or know the HIV status of your partner(s).  You take drugs by injection.  You are sexually active with a partner who has HIV. Talk with your health care provider about whether you are at high risk of being infected with HIV. If you choose to begin PrEP, you should first be tested for HIV. You should then be tested every 3 months for as long as you are taking PrEP.  PREGNANCY   If you are premenopausal and you may become pregnant, ask your health care provider about preconception counseling.  If you may become pregnant, take 400 to 800 micrograms (mcg) of folic acid every day.  If you want to prevent pregnancy, talk to your health care provider about birth control (contraception). OSTEOPOROSIS AND MENOPAUSE   Osteoporosis is a disease in which the bones lose minerals and strength with aging. This can result in serious bone fractures. Your risk for osteoporosis can be identified using a bone density scan.  If you are 59 years of age or older, or if you are at risk for osteoporosis and fractures, ask your health care provider if you should be screened.  Ask your health care provider whether you should take a  calcium or vitamin D supplement to lower your risk for osteoporosis.  Menopause may have certain physical symptoms and risks.  Hormone replacement therapy may reduce some of these symptoms and risks. Talk to your health care provider about whether hormone replacement therapy is right for you.  HOME CARE INSTRUCTIONS   Schedule regular health, dental, and eye exams.  Stay current with your immunizations.   Do not use any tobacco products including cigarettes, chewing tobacco, or electronic cigarettes.  If you are pregnant, do not drink alcohol.  If you are breastfeeding, limit how much and how often you drink  alcohol.  Limit alcohol intake to no more than 1 drink per day for nonpregnant women. One drink equals 12 ounces of beer, 5 ounces of wine, or 1 ounces of hard liquor.  Do not use street drugs.  Do not share needles.  Ask your health care provider for help if you need support or information about quitting drugs.  Tell your health care provider if you often feel depressed.  Tell your health care provider if you have ever been abused or do not feel safe at home.   This information is not intended to replace advice given to you by your health care provider. Make sure you discuss any questions you have with your health care provider.   Document Released: 02/23/2011 Document Revised: 08/31/2014 Document Reviewed: 07/12/2013 Elsevier Interactive Patient Education 2016 [place]. Sangeeta Youse M.D.

## 2015-08-20 NOTE — Patient Instructions (Addendum)
Increase the lisinopril to 20 mg per day for bp control .   Labs are good .   Will plan referral to  Pt for fall prevention.  Goal is below  140/90 . But  If goes to 100 and below and causes light headed ness then  Contact us fpor fu.  Consider  Talking to counselor about the losses  As discussed.  Get a mammogram  [facility] or [facility] center.    Health Maintenance, Female Adopting a healthy lifestyle and getting preventive care can go a long way to promote health and wellness. Talk with your health care provider about what schedule of regular examinations is right for you. This is a good chance for you to check in with your provider about disease prevention and staying healthy. In between checkups, there are plenty of things you can do on your own. Experts have done a lot of research about which lifestyle changes and preventive measures are most likely to keep you healthy. Ask your health care provider for more information. WEIGHT AND DIET  Eat a healthy diet  Be sure to include plenty of vegetables, fruits, low-fat dairy products, and lean protein.  Do not eat a lot of foods high in solid fats, added sugars, or salt.  Get regular exercise. This is one of the most important things you can do for your health.  Most adults should exercise for at least 150 minutes each week. The exercise should increase your heart rate and make you sweat (moderate-intensity exercise).  Most adults should also do strengthening exercises at least twice a week. This is in addition to the moderate-intensity exercise.  Maintain a healthy weight  Body mass index (BMI) is a measurement that can be used to identify possible weight problems. It estimates body fat based on height and weight. Your health care provider can help determine your BMI and help you achieve or maintain a healthy weight.  For females 54 years of age and older:   A BMI below 18.5 is considered underweight.  A BMI of 18.5 to 24.9 is  normal.  A BMI of 25 to 29.9 is considered overweight.  A BMI of 30 and above is considered obese.  Watch levels of cholesterol and blood lipids  You should start having your blood tested for lipids and cholesterol at 68 years of age, then have this test every 5 years.  You may need to have your cholesterol levels checked more often if:  Your lipid or cholesterol levels are high.  You are older than 68 years of age.  You are at high risk for heart disease.  CANCER SCREENING   Lung Cancer  Lung cancer screening is recommended for adults 7-42 years old who are at high risk for lung cancer because of a history of smoking.  A yearly low-dose CT scan of the lungs is recommended for people who:  Currently smoke.  Have quit within the past 15 years.  Have at least a 30-pack-year history of smoking. A pack year is smoking an average of one pack of cigarettes a day for 1 year.  Yearly screening should continue until it has been 15 years since you quit.  Yearly screening should stop if you develop a health problem that would prevent you from having lung cancer treatment.  Breast Cancer  Practice breast self-awareness. This means understanding how your breasts normally appear and feel.  It also means doing regular breast self-exams. Let your health care provider know about any changes,  no matter how small.  If you are in your 20s or 30s, you should have a clinical breast exam (CBE) by a health care provider every 1-3 years as part of a regular health exam.  If you are 56 or older, have a CBE every year. Also consider having a breast X-ray (mammogram) every year.  If you have a family history of breast cancer, talk to your health care provider about genetic screening.  If you are at high risk for breast cancer, talk to your health care provider about having an MRI and a mammogram every year.  Breast cancer gene (BRCA) assessment is recommended for women who have family members  with BRCA-related cancers. BRCA-related cancers include:  Breast.  Ovarian.  Tubal.  Peritoneal cancers.  Results of the assessment will determine the need for genetic counseling and BRCA1 and BRCA2 testing. Cervical Cancer Your health care provider may recommend that you be screened regularly for cancer of the pelvic organs (ovaries, uterus, and vagina). This screening involves a pelvic examination, including checking for microscopic changes to the surface of your cervix (Pap test). You may be encouraged to have this screening done every 3 years, beginning at age 38.  For women ages 36-65, health care providers may recommend pelvic exams and Pap testing every 3 years, or they may recommend the Pap and pelvic exam, combined with testing for human papilloma virus (HPV), every 5 years. Some types of HPV increase your risk of cervical cancer. Testing for HPV may also be done on women of any age with unclear Pap test results.  Other health care providers may not recommend any screening for nonpregnant women who are considered low risk for pelvic cancer and who do not have symptoms. Ask your health care provider if a screening pelvic exam is right for you.  If you have had past treatment for cervical cancer or a condition that could lead to cancer, you need Pap tests and screening for cancer for at least 20 years after your treatment. If Pap tests have been discontinued, your risk factors (such as having a new sexual partner) need to be reassessed to determine if screening should resume. Some women have medical problems that increase the chance of getting cervical cancer. In these cases, your health care provider may recommend more frequent screening and Pap tests. Colorectal Cancer  This type of cancer can be detected and often prevented.  Routine colorectal cancer screening usually begins at 68 years of age and continues through 68 years of age.  Your health care provider may recommend  screening at an earlier age if you have risk factors for colon cancer.  Your health care provider may also recommend using home test kits to check for hidden blood in the stool.  A small camera at the end of a tube can be used to examine your colon directly (sigmoidoscopy or colonoscopy). This is done to check for the earliest forms of colorectal cancer.  Routine screening usually begins at age 4.  Direct examination of the colon should be repeated every 5-10 years through 68 years of age. However, you may need to be screened more often if early forms of precancerous polyps or small growths are found. Skin Cancer  Check your skin from head to toe regularly.  Tell your health care provider about any new moles or changes in moles, especially if there is a change in a mole's shape or color.  Also tell your health care provider if you have a mole  that is larger than the size of a pencil eraser.  Always use sunscreen. Apply sunscreen liberally and repeatedly throughout the day.  Protect yourself by wearing long sleeves, pants, a wide-brimmed hat, and sunglasses whenever you are outside. HEART DISEASE, DIABETES, AND HIGH BLOOD PRESSURE   High blood pressure causes heart disease and increases the risk of stroke. High blood pressure is more likely to develop in:  People who have blood pressure in the high end of the normal range (130-139/85-89 mm Hg).  People who are overweight or obese.  People who are African American.  If you are 44-67 years of age, have your blood pressure checked every 3-5 years. If you are 69 years of age or older, have your blood pressure checked every year. You should have your blood pressure measured twice--once when you are at a hospital or clinic, and once when you are not at a hospital or clinic. Record the average of the two measurements. To check your blood pressure when you are not at a hospital or clinic, you can use:  An automated blood pressure machine at a  pharmacy.  A home blood pressure monitor.  If you are between 14 years and 66 years old, ask your health care provider if you should take aspirin to prevent strokes.  Have regular diabetes screenings. This involves taking a blood sample to check your fasting blood sugar level.  If you are at a normal weight and have a low risk for diabetes, have this test once every three years after 68 years of age.  If you are overweight and have a high risk for diabetes, consider being tested at a younger age or more often. PREVENTING INFECTION  Hepatitis B  If you have a higher risk for hepatitis B, you should be screened for this virus. You are considered at high risk for hepatitis B if:  You were born in a country where hepatitis B is common. Ask your health care provider which countries are considered high risk.  Your parents were born in a high-risk country, and you have not been immunized against hepatitis B (hepatitis B vaccine).  You have HIV or AIDS.  You use needles to inject street drugs.  You live with someone who has hepatitis B.  You have had sex with someone who has hepatitis B.  You get hemodialysis treatment.  You take certain medicines for conditions, including cancer, organ transplantation, and autoimmune conditions. Hepatitis C  Blood testing is recommended for:  Everyone born from [birth year] through 1964.  Anyone with known risk factors for hepatitis C. Sexually transmitted infections (STIs)  You should be screened for sexually transmitted infections (STIs) including gonorrhea and chlamydia if:  You are sexually active and are younger than 68 years of age.  You are older than 68 years of age and your health care provider tells you that you are at risk for this type of infection.  Your sexual activity has changed since you were last screened and you are at an increased risk for chlamydia or gonorrhea. Ask your health care provider if you are at risk.  If you do not  have HIV, but are at risk, it may be recommended that you take a prescription medicine daily to prevent HIV infection. This is called pre-exposure prophylaxis (PrEP). You are considered at risk if:  You are sexually active and do not regularly use condoms or know the HIV status of your partner(s).  You take drugs by injection.  You are sexually  active with a partner who has HIV. Talk with your health care provider about whether you are at high risk of being infected with HIV. If you choose to begin PrEP, you should first be tested for HIV. You should then be tested every 3 months for as long as you are taking PrEP.  PREGNANCY   If you are premenopausal and you may become pregnant, ask your health care provider about preconception counseling.  If you may become pregnant, take 400 to 800 micrograms (mcg) of folic acid every day.  If you want to prevent pregnancy, talk to your health care provider about birth control (contraception). OSTEOPOROSIS AND MENOPAUSE   Osteoporosis is a disease in which the bones lose minerals and strength with aging. This can result in serious bone fractures. Your risk for osteoporosis can be identified using a bone density scan.  If you are 71 years of age or older, or if you are at risk for osteoporosis and fractures, ask your health care provider if you should be screened.  Ask your health care provider whether you should take a calcium or vitamin D supplement to lower your risk for osteoporosis.  Menopause may have certain physical symptoms and risks.  Hormone replacement therapy may reduce some of these symptoms and risks. Talk to your health care provider about whether hormone replacement therapy is right for you.  HOME CARE INSTRUCTIONS   Schedule regular health, dental, and eye exams.  Stay current with your immunizations.   Do not use any tobacco products including cigarettes, chewing tobacco, or electronic cigarettes.  If you are pregnant, do not  drink alcohol.  If you are breastfeeding, limit how much and how often you drink alcohol.  Limit alcohol intake to no more than 1 drink per day for nonpregnant women. One drink equals 12 ounces of beer, 5 ounces of wine, or 1 ounces of hard liquor.  Do not use street drugs.  Do not share needles.  Ask your health care provider for help if you need support or information about quitting drugs.  Tell your health care provider if you often feel depressed.  Tell your health care provider if you have ever been abused or do not feel safe at home.   This information is not intended to replace advice given to you by your health care provider. Make sure you discuss any questions you have with your health care provider.   Document Released: 02/23/2011 Document Revised: 08/31/2014 Document Reviewed: 07/12/2013 Elsevier Interactive Patient Education [employer].

## 2015-08-21 DIAGNOSIS — Z9181 History of falling: Secondary | ICD-10-CM | POA: Insufficient documentation

## 2015-08-21 MED ORDER — LISINOPRIL 10 MG PO TABS: 10.0000 mg | ORAL_TABLET | Freq: Every day | ORAL | Status: DC

## 2015-08-26 ENCOUNTER — Other Ambulatory Visit: Payer: Self-pay | Admitting: Internal Medicine

## 2015-08-27 ENCOUNTER — Other Ambulatory Visit: Payer: Self-pay | Admitting: Internal Medicine

## 2015-08-27 NOTE — Telephone Encounter (Signed)
Denied.  FILLED FOR THREE MONTHS ON 07/31/2015.  REQUEST IS EARLY

## 2015-08-27 NOTE — Telephone Encounter (Signed)
Sent to the pharmacy by e-scribe. 

## 2015-09-02 ENCOUNTER — Telehealth: Payer: Self-pay | Admitting: Family Medicine

## 2015-09-02 DIAGNOSIS — I951 Orthostatic hypotension: Secondary | ICD-10-CM

## 2015-09-02 NOTE — Telephone Encounter (Signed)
Patient's husband left a message on my machine.  Stated he was concerned about [name]'s orthostatic blood pressures.  Would like a return call.  Tried to call back.  Left a message and information that we will be opening at 10 AM tomorrow.

## 2015-09-03 NOTE — Telephone Encounter (Signed)
Spoke to the patient's husband.  He is concerned about her dizzy spells.  He works at [employer].  Started performing orthostatic blood pressures from a sitting to standing position and waiting 3 minutes in between.  Results of bp readings listed below.  08/25/2015    08/26/2015    08/27/2015    08/29/2015    08/31/2015    09/01/2015    09/03/2015 138/88       128/88       130/82       122/70       130/88       116/84       102/68 96/80         96/70         110/80       88/60         90/74         94/85         84/70  Please advise. Thanks!

## 2015-09-03 NOTE — Telephone Encounter (Signed)
I would have her stop the lisinopril for 3-5 days    Send in readings at that time  zif doing better dizziness going away .   then will plan to add back lisinopril  1/2 dose ( ? 10 mg?)each day   Fu with readings .   I

## 2015-09-04 NOTE — Telephone Encounter (Signed)
Spoke to [employer].  Informed both of plan.  They will call back in 3-5 days with orthostatic blood pressure readings.

## 2015-09-11 NOTE — Telephone Encounter (Signed)
Looks like orthostatic changes are significant off medication.  Stay off med  Check 5 days of readings next week   If still   orthostatic then we may refer for cardiology opinion. Possible that  Elavil or other meds could add to the problem .

## 2015-09-11 NOTE — Telephone Encounter (Signed)
Orthostatic Readings without medication  1/13  1/14  1/15  1/16  1/18 170/100           140/90  130/96  130/88  142/84   120/80  128/90  112/82  97/72

## 2015-09-12 NOTE — Telephone Encounter (Signed)
Pt notified to check readings for another 5 days and to stay off of medication.  Will send in readings.

## 2015-09-17 ENCOUNTER — Ambulatory Visit (INDEPENDENT_AMBULATORY_CARE_PROVIDER_SITE_OTHER): Payer: Medicare Other | Admitting: Adult Health

## 2015-09-17 VITALS — BP 122/80 | HR 97 | Temp 97.5°F | Wt 102.4 lb

## 2015-09-17 DIAGNOSIS — R05 Cough: Secondary | ICD-10-CM

## 2015-09-17 DIAGNOSIS — R059 Cough, unspecified: Secondary | ICD-10-CM

## 2015-09-17 MED ORDER — HYDROCODONE-HOMATROPINE 5-1.5 MG/5ML PO SYRP: 5.0000 mL | ORAL_SOLUTION | Freq: Three times a day (TID) | ORAL | Status: DC | PRN

## 2015-09-17 MED ORDER — BENZONATATE 200 MG PO CAPS: 200.0000 mg | ORAL_CAPSULE | Freq: Three times a day (TID) | ORAL | Status: DC | PRN

## 2015-09-17 NOTE — Patient Instructions (Addendum)
It was great meeting you today!  Your exam is consistent with a viral upper respiratory infection. I have sent in a prescription for tessalon pearls that you can sue during the day time for your cough, you can also use Delsym.   During the night you can use the cough syrup as needed - it will make you sleepy.   Stay well hydrated and get some rest.   Follow up as needed    Upper Respiratory Infection, Adult Most upper respiratory infections (URIs) are a viral infection of the air passages leading to the lungs. A URI affects the nose, throat, and upper air passages. The most common type of URI is nasopharyngitis and is typically referred to as "the common cold." URIs run their course and usually go away on their own. Most of the time, a URI does not require medical attention, but sometimes a bacterial infection in the upper airways can follow a viral infection. This is called a secondary infection. Sinus and middle ear infections are common types of secondary upper respiratory infections. Bacterial pneumonia can also complicate a URI. A URI can worsen asthma and chronic obstructive pulmonary disease (COPD). Sometimes, these complications can require emergency medical care and may be life threatening.  CAUSES Almost all URIs are caused by viruses. A virus is a type of germ and can spread from one person to another.  RISKS FACTORS You may be at risk for a URI if:   You smoke.   You have chronic heart or lung disease.  You have a weakened defense (immune) system.   You are very young or very old.   You have nasal allergies or asthma.  You work in crowded or poorly ventilated areas.  You work in health care facilities or schools. SIGNS AND SYMPTOMS  Symptoms typically develop 2-3 days after you come in contact with a cold virus. Most viral URIs last 7-10 days. However, viral URIs from the influenza virus (flu virus) can last 14-18 days and are typically more severe. Symptoms may  include:   Runny or stuffy (congested) nose.   Sneezing.   Cough.   Sore throat.   Headache.   Fatigue.   Fever.   Loss of appetite.   Pain in your forehead, behind your eyes, and over your cheekbones (sinus pain).  Muscle aches.  DIAGNOSIS  Your health care provider may diagnose a URI by:  Physical exam.  Tests to check that your symptoms are not due to another condition such as:  Strep throat.  Sinusitis.  Pneumonia.  Asthma. TREATMENT  A URI goes away on its own with time. It cannot be cured with medicines, but medicines may be prescribed or recommended to relieve symptoms. Medicines may help:  Reduce your fever.  Reduce your cough.  Relieve nasal congestion. HOME CARE INSTRUCTIONS   Take medicines only as directed by your health care provider.   Gargle warm saltwater or take cough drops to comfort your throat as directed by your health care provider.  Use a warm mist humidifier or inhale steam from a shower to increase air moisture. This may make it easier to breathe.  Drink enough fluid to keep your urine clear or pale yellow.   Eat soups and other clear broths and maintain good nutrition.   Rest as needed.   Return to work when your temperature has returned to normal or as your health care provider advises. You may need to stay home longer to avoid infecting others. You can  also use a face mask and careful hand washing to prevent spread of the virus.  Increase the usage of your inhaler if you have asthma.   Do not use any tobacco products, including cigarettes, chewing tobacco, or electronic cigarettes. If you need help quitting, ask your health care provider. PREVENTION  The best way to protect yourself from getting a cold is to practice good hygiene.   Avoid oral or hand contact with people with cold symptoms.   Wash your hands often if contact occurs.  There is no clear evidence that vitamin C, vitamin E, echinacea, or  exercise reduces the chance of developing a cold. However, it is always recommended to get plenty of rest, exercise, and practice good nutrition.  SEEK MEDICAL CARE IF:   You are getting worse rather than better.   Your symptoms are not controlled by medicine.   You have chills.  You have worsening shortness of breath.  You have brown or red mucus.  You have yellow or brown nasal discharge.  You have pain in your face, especially when you bend forward.  You have a fever.  You have swollen neck glands.  You have pain while swallowing.  You have white areas in the back of your throat. SEEK IMMEDIATE MEDICAL CARE IF:   You have severe or persistent:  Headache.  Ear pain.  Sinus pain.  Chest pain.  You have chronic lung disease and any of the following:  Wheezing.  Prolonged cough.  Coughing up blood.  A change in your usual mucus.  You have a stiff neck.  You have changes in your:  Vision.  Hearing.  Thinking.  Mood. MAKE SURE YOU:   Understand these instructions.  Will watch your condition.  Will get help right away if you are not doing well or get worse.   This information is not intended to replace advice given to you by your health care provider. Make sure you discuss any questions you have with your health care provider.   Document Released: 02/03/2001 Document Revised: 12/25/2014 Document Reviewed: 11/15/2013 Elsevier Interactive Patient Education [employer].

## 2015-09-17 NOTE — Progress Notes (Signed)
Pre visit review using our clinic review tool, if applicable. No additional management support is needed unless otherwise documented below in the visit note. 

## 2015-09-17 NOTE — Progress Notes (Signed)
Subjective:    Patient ID: [name], female    DOB: 09/20/1945, 70 y.o.   MRN: [id number]  HPI  70 year old female, patient of Dr. Regis Bill who presents to the office today with the complaint of non productive cough x 6 days. She feels like she is wheezing but is not short of breath. She also feels fatigues and on occasion dizzy " like my blood pressure dropped."   She denies fevers, nausea, vomiting, diarrhea.   She has tried over the counter cold and cough medication as well as Mucinex.    Review of Systems  Constitutional: Positive for fatigue.  HENT: Positive for congestion. Negative for postnasal drip, rhinorrhea, sinus pressure and sore throat.   Respiratory: Positive for cough and wheezing. Negative for choking, chest tightness, shortness of breath and stridor.   Cardiovascular: Negative.   Neurological: Positive for dizziness and light-headedness. Negative for speech difficulty, weakness and headaches.  All other systems reviewed and are negative.  Past Medical History  Diagnosis Date  . Hypothyroidism   . Rosacea   . GERD (gastroesophageal reflux disease)     WOULD USE OTC MEDICATION IF NEEDED  . 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 Tachycardia     PT STATES HER HEART RATE USUALLY ABOVE 100    Social History   Social History  . Marital Status: Married    Spouse Name: N/A  . Number of Children: N/A  . Years of Education: N/A   Occupational History  . Not on file.   Social History Main Topics  . Smoking status: Current Every Day Smoker -- 1.00 packs/day    Types: Cigarettes  . Smokeless tobacco: Never Used     Comment: back to tobacco 10 per day   . Alcohol Use: 0.0 oz/week    0 Standard drinks or equivalent per week  . Drug Use: No  . Sexual Activity: Not on file   Other Topics Concern  . Not on file   Social History Narrative   Occupation:  Manicure pedicurist   Works about 30 hours a week to 15 hours    [place] getting a divorce    Married.    No health insurance self employed  Now on medicare    2 dogs     Past Surgical History  Procedure Laterality Date  . Tonsillectomy  1953  . Breast biopsy      bilateral benign  . Bunionectomy  2003    bilateral  . Lumbar laminectomy/decompression microdiscectomy Left 03/17/2013    Procedure: LUMBAR LAMINECTOMY/DECOMPRESSION IF RECESS STENOSIS FORAMINOTOMIE OF L4 ROOT AND L5,MICRODISECTOMY  L4-5 ON LEFT;  Surgeon: Tobi Bastos, MD;  Location: [facility];  Service: Orthopedics;  Laterality: Left;    Family History  Problem Relation Age of Onset  . Heart disease    . Pancreatic cancer Brother     No Known Allergies  Current Outpatient Prescriptions on File Prior to Visit  Medication Sig Dispense Refill  . amitriptyline (ELAVIL) 100 MG tablet TAKE 3 TABLETS BY MOUTH EVERY DAY 270 tablet 0  . Ascorbic Acid (VITAMIN C WITH ROSE HIPS) 1000 MG tablet Take 1,000 mg by mouth daily.    . Biotin 5000 MCG TABS Take 5,000 mcg by mouth daily.     . DULoxetine (CYMBALTA) 20 MG capsule TAKE 1 CAPSULE BY MOUTH DAILY. 90 capsule 1  . erythromycin ophthalmic ointment  APPLY OINTMENT TO BOTH EYES AT BEDTIME  2  . gabapentin (NEURONTIN) 300 MG capsule TAKE ONE CAPSULE IN THE DAYTIME AND 2 CAPSULE AT BEDTIME 90 capsule 5  . lisinopril (PRINIVIL,ZESTRIL) 10 MG tablet Take 1-2 tablets (10-20 mg total) by mouth daily. 180 tablet 0  . meloxicam (MOBIC) 15 MG tablet TAKE 1 TABLET (15 MG TOTAL) BY MOUTH DAILY. AS NEEDED FOR PAIN 30 tablet 2  . Multiple Vitamins-Minerals (MULTIVITAMIN WITH MINERALS) tablet Take 1 tablet by mouth daily.    . polyethylene glycol (MIRALAX / GLYCOLAX) packet Take 17 g by mouth daily as needed (for constipation).     . SYNTHROID 75 MCG tablet TAKE 1 TABLET BY MOUTH DAILY BEFORE BREAKFAST. 90 tablet 1  . tretinoin (RETIN-A) 0.025 % cream Reported on 08/20/2015  0   No current facility-administered medications on file prior to  visit.    BP 122/80 mmHg  Pulse 97  Temp(Src) 97.5 F (36.4 C) (Oral)  Wt 102 lb 6.4 oz (46.448 kg)       Objective:   Physical Exam  Constitutional: She is oriented to person, place, and time. She appears well-developed and well-nourished. No distress.  HENT:  Head: Normocephalic and atraumatic.  Right Ear: External ear normal.  Left Ear: External ear normal.  Nose: Nose normal.  Mouth/Throat: Oropharynx is clear and moist. No oropharyngeal exudate.  Cardiovascular: Normal rate, regular rhythm, normal heart sounds and intact distal pulses.  Exam reveals no gallop and no friction rub.   No murmur heard. Pulmonary/Chest: Effort normal and breath sounds normal. No respiratory distress. She has no wheezes. She has no rales. She exhibits no tenderness.   Increased expiatory phase  Lymphadenopathy:    She has no cervical adenopathy.  Neurological: She is alert and oriented to person, place, and time.  Skin: Skin is warm and dry. No rash noted. She is not diaphoretic. No erythema. No pallor.  Bruising in various stages of healing noticed to right eye. Patient fell on the porch and hit her eye on the railing.   Psychiatric: She has a normal mood and affect. Her behavior is normal. Judgment and thought content normal.  Nursing note and vitals reviewed.     Assessment & Plan:  1. Cough - likely bronchitis. Patient states that she does not handle prednisone well.  - HYDROcodone-homatropine (HYCODAN) 5-1.5 MG/5ML syrup; Take 5 mLs by mouth every 8 (eight) hours as needed for cough.  Dispense: 120 mL; Refill: 0 - benzonatate (TESSALON) 200 MG capsule; Take 1 capsule (200 mg total) by mouth 3 (three) times daily as needed for cough.  Dispense: 20 capsule; Refill: 0 - Can also use Delsym - Stay hydrated and rest.  - Orthostatics done, no orthostatic hypotension noted. All BP's 120's/80's

## 2015-09-24 ENCOUNTER — Ambulatory Visit
Admission: RE | Admit: 2015-09-24 | Discharge: 2015-09-24 | Disposition: A | Discharge status: Home / Self Care | Payer: Medicare Other | Source: Ambulatory Visit | Attending: Internal Medicine | Admitting: Internal Medicine

## 2015-09-24 DIAGNOSIS — Z1239 Encounter for other screening for malignant neoplasm of breast: Secondary | ICD-10-CM

## 2015-09-24 DIAGNOSIS — Z1231 Encounter for screening mammogram for malignant neoplasm of breast: Secondary | ICD-10-CM | POA: Diagnosis not present

## 2015-09-25 NOTE — Telephone Encounter (Signed)
Left a message for a return call on home phone.

## 2015-10-01 ENCOUNTER — Other Ambulatory Visit: Payer: Self-pay | Admitting: Internal Medicine

## 2015-10-01 NOTE — Telephone Encounter (Signed)
Sent to the pharmacy by e-scribe. 

## 2015-10-02 NOTE — Telephone Encounter (Signed)
Left a message for a return call.

## 2015-10-07 NOTE — Telephone Encounter (Signed)
Would like to get consult cardiology for orthostatic    Hypotension .  Please refer.  The amitriptyline could be adding to this problem  But you have been on this for a long time  Without a problem .

## 2015-10-07 NOTE — Telephone Encounter (Signed)
Spoke to patient's husband.  He reports standing and sitting blood pressures  2/6  2/7  2/8  2/13 126/84  142/90  156/88  138/80 112/78  98/72  138/84  102/74

## 2015-10-08 NOTE — Telephone Encounter (Signed)
Mr. Taunton notified and referral placed.

## 2015-10-11 ENCOUNTER — Ambulatory Visit (INDEPENDENT_AMBULATORY_CARE_PROVIDER_SITE_OTHER): Payer: Medicare Other | Admitting: Cardiovascular Disease

## 2015-10-11 ENCOUNTER — Encounter: Payer: Self-pay | Admitting: Cardiovascular Disease

## 2015-10-11 VITALS — BP 130/60 | HR 90 | Ht 62.0 in | Wt 105.0 lb

## 2015-10-11 DIAGNOSIS — Z720 Tobacco use: Secondary | ICD-10-CM

## 2015-10-11 DIAGNOSIS — I10 Essential (primary) hypertension: Secondary | ICD-10-CM

## 2015-10-11 DIAGNOSIS — F17200 Nicotine dependence, unspecified, uncomplicated: Secondary | ICD-10-CM

## 2015-10-11 DIAGNOSIS — I951 Orthostatic hypotension: Secondary | ICD-10-CM

## 2015-10-11 DIAGNOSIS — R0989 Other specified symptoms and signs involving the circulatory and respiratory systems: Secondary | ICD-10-CM | POA: Diagnosis not present

## 2015-10-11 NOTE — Progress Notes (Signed)
Cardiology Office Note   Date:  10/11/2015   ID:  [name], DOB 04-15-1946, MRN [id number]  PCP:  [name], MD  Cardiologist:   Thayer Headings, MD   Chief Complaint  Patient presents with  . Hypertension   Problem List 1. Essential HTN 2. Hypothyroidism 3, ? COPD - long time smoker  4. Frequent falls  5. pulsatile abdominal aorta  6. Cerebral aneurism    History of Present Illness: [name] is a 70 y.o. female who presents for evaluation of her HTN Was seen with her husband, Rush Landmark  Has been on Lisinopril for several years.   Initially on 10 mg and then it was increased to 20 mg a day  She was taking her BP regularly  She has been falling - not due to orthostasis.  Husband is a CNA at a nursing home.  He took her BP on occasion and found a sudden drop of between 20-30 mm Hg on occasion .   Patient denies any symptoms related to this orthostasis.   Has headaches that would last all day   Wife works at [employer].   She does manicures and pedicures all day   She is off the [place] completely for the past several months .   Continues to fall on occasion   Has lost 7 lbs over the past year.  Eating and drinking regularly .   Past Medical History  Diagnosis Date  . Hypothyroidism   . Rosacea   . GERD (gastroesophageal reflux disease)     WOULD USE OTC MEDICATION IF NEEDED  . 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 Tachycardia     PT STATES HER HEART RATE USUALLY ABOVE 100    Past Surgical History  Procedure Laterality Date  . Tonsillectomy  1953  . Breast biopsy      bilateral benign  . Bunionectomy  2003    bilateral  . Lumbar laminectomy/decompression microdiscectomy Left 03/17/2013    Procedure: LUMBAR LAMINECTOMY/DECOMPRESSION IF RECESS STENOSIS FORAMINOTOMIE OF L4 ROOT AND L5,MICRODISECTOMY  L4-5 ON LEFT;  Surgeon: Tobi Bastos, MD;  Location: [facility];  Service: Orthopedics;  Laterality: Left;     Current  Outpatient Prescriptions  Medication Sig Dispense Refill  . amitriptyline (ELAVIL) 100 MG tablet TAKE 3 TABLETS BY MOUTH EVERY DAY 270 tablet 1  . Ascorbic Acid (VITAMIN C WITH ROSE HIPS) 1000 MG tablet Take 1,000 mg by mouth daily.    . benzonatate (TESSALON) 200 MG capsule Take 1 capsule (200 mg total) by mouth 3 (three) times daily as needed for cough. 20 capsule 0  . Biotin 5000 MCG TABS Take 5,000 mcg by mouth daily.     . DULoxetine (CYMBALTA) 20 MG capsule TAKE 1 CAPSULE BY MOUTH DAILY. 90 capsule 1  . erythromycin ophthalmic ointment APPLY OINTMENT TO BOTH EYES AT BEDTIME  2  . gabapentin (NEURONTIN) 300 MG capsule TAKE ONE CAPSULE IN THE DAYTIME AND 2 CAPSULE AT BEDTIME 90 capsule 5  . HYDROcodone-homatropine (HYCODAN) 5-1.5 MG/5ML syrup Take 5 mLs by mouth every 8 (eight) hours as needed for cough. 120 mL 0  . meloxicam (MOBIC) 15 MG tablet TAKE 1 TABLET (15 MG TOTAL) BY MOUTH DAILY. AS NEEDED FOR PAIN 30 tablet 2  . Multiple Vitamins-Minerals (MULTIVITAMIN WITH MINERALS) tablet Take 1 tablet by mouth daily.    . polyethylene glycol (MIRALAX / GLYCOLAX) packet Take 17 g by mouth daily as needed (  for constipation).     . SYNTHROID 75 MCG tablet TAKE 1 TABLET BY MOUTH DAILY BEFORE BREAKFAST. 90 tablet 1  . tretinoin (RETIN-A) 0.025 % cream Reported on 08/20/2015  0  . lisinopril (PRINIVIL,ZESTRIL) 10 MG tablet Take 1-2 tablets (10-20 mg total) by mouth daily. (Patient not taking: Reported on 10/11/2015) 180 tablet 0   No current facility-administered medications for this visit.    Allergies:   Review of patient's allergies indicates no known allergies.    Social History:  The patient  reports that she has been smoking Cigarettes.  She has been smoking about 1.00 pack per day. She has never used smokeless tobacco. She reports that she drinks alcohol. She reports that she does not use illicit drugs.   Family History:  The patient's family history includes Pancreatic cancer in her  brother.    ROS:  Please see the history of present illness.    Review of Systems: Constitutional:  denies fever, chills, diaphoresis, appetite change and fatigue.  HEENT: denies photophobia, eye pain, redness, hearing loss, ear pain, congestion, sore throat, rhinorrhea, sneezing, neck pain, neck stiffness and tinnitus.  Respiratory: denies SOB, DOE, cough, chest tightness, and wheezing.  Cardiovascular: denies chest pain, palpitations and leg swelling.  Gastrointestinal: denies nausea, vomiting, abdominal pain, diarrhea, constipation, blood in stool.  Genitourinary: denies dysuria, urgency, frequency, hematuria, flank pain and difficulty urinating.  Musculoskeletal: denies  myalgias, back pain, joint swelling, arthralgias and gait problem.   Skin: denies pallor, rash and wound.  Neurological: denies dizziness, seizures, syncope, weakness, light-headedness, numbness and headaches.   Hematological: denies adenopathy, easy bruising, personal or family bleeding history.  Psychiatric/ Behavioral: denies suicidal ideation, mood changes, confusion, nervousness, sleep disturbance and agitation.       All other systems are reviewed and negative.    PHYSICAL EXAM: VS:  BP 140/98 mmHg  Pulse 90  Ht '5\' 2"'$  (1.575 m)  Wt 105 lb (47.628 kg)  BMI 19.20 kg/m2 , BMI Body mass index is 19.2 kg/(m^2). GEN: Well nourished, well developed, in no acute distress HEENT: normal Neck: no JVD, carotid bruits, or masses Cardiac: RRR;  Very faint systolic murmur, rubs, or gallops,no edema  Respiratory:  clear to auscultation bilaterally, normal work of breathing GI: soft, nontender, nondistended, + BS,   + pulsatile abdominal aorta  MS: no deformity or atrophy Skin: warm and dry, no rash Neuro:  Strength and sensation are intact Psych: normal   EKG:  EKG is ordered today. The ekg ordered today demonstrates nSR at 90.  Normal .      Recent Labs: 08/13/2015: ALT 7; BUN 11; Creatinine, Ser 0.92;  Hemoglobin 12.3; Platelets 306.0; Potassium 3.8; Sodium 138; TSH 0.81    Lipid Panel    Component Value Date/Time   CHOL 162 08/13/2015 0754   TRIG 102.0 08/13/2015 0754   TRIG 75 07/12/2006 0818   HDL 58.90 08/13/2015 0754   CHOLHDL 3 08/13/2015 0754   CHOLHDL 3.7 CALC 07/12/2006 0818   VLDL 20.4 08/13/2015 0754   LDLCALC 82 08/13/2015 0754   LDLDIRECT 132.5 06/23/2010 0819      Wt Readings from Last 3 Encounters:  10/11/15 105 lb (47.628 kg)  09/17/15 102 lb 6.4 oz (46.448 kg)  08/20/15 101 lb 8 oz (46.04 kg)      Other studies Reviewed: Additional studies/ records that were reviewed today include: . Review of the above records demonstrates:    ASSESSMENT AND PLAN:  1. Essential HTN- she carried a diagnosis  of hypertension although her blood pressure was normal here in the office. She's not been on lisinopril for several months. At this point I do not think that she needs any lisinopril. She has had some episodes of orthostatic hypertension as document by her husband. I think that she may have been overtreated with the lisinopril. In addition, she does not eat and drink regularly. She's been eating only a crit half a grapefruit for breakfast and does not eat lunch. It sounds like her diet is severely deficient and protein and electrolytes. I strongly encouraged her to eat much better diet including protein  for all 3 meals. I've encouraged her to eat hard boiled eggs or  other sources of protein in the morning. I've encouraged her to eat grilled chicken with  Salad or similar proteins  for lunch.  Ive encouraged her to drink V-8 juice on occasion   In the office her blood pressure dropped slightly with standing. Her systolic pressure was 572 and it dropped to 128 after standing for about 3-4 minutes. At this point I do not think that this can be a major concern.  2. Hypothyroidism 3, ? COPD - long time smoker - I encouraged her to stop smoking.  4. Frequent falls - Her falls  sound more related to neuromuscular weakness and aren't clearly not related to orthostatic hypotension.  5. pulsatile abdominal aorta  - We'll get an ultrasound of her abdomen to rule out abdominal aortic aneurysm.   6. Cerebral aneurism   Current medicines are reviewed at length with the patient today.  The patient does not have concerns regarding medicines.  The following changes have been made:  no change  Labs/ tests ordered today include:  No orders of the defined types were placed in this encounter.     Disposition:   FU with me as needed.     Kathey Simer, Wonda Cheng, MD  10/11/2015 2:39 PM    [facility] [place], [place], [place]  [address] Phone: [phone]; Fax: [phone]   [facility]  [address] [place], [place]  [address] [phone]   Fax [phone]

## 2015-10-11 NOTE — Patient Instructions (Addendum)
Medication Instructions:  Your physician recommends that you continue on your current medications as directed. Please refer to the Current Medication list given to you today.   Labwork: NONE  Testing/Procedures: Your physician has requested that you have an abdominal aorta duplex. During this test, an ultrasound is used to evaluate the aorta. Allow 30 minutes for this exam. Do not eat after midnight the day before and avoid carbonated beverages  Your physician has requested that you have an echocardiogram. Echocardiography is a painless test that uses sound waves to create images of your heart. It provides your doctor with information about the size and shape of your heart and how well your heart's chambers and valves are working. This procedure takes approximately one hour. There are no restrictions for this procedure.   Follow-Up: Your physician recommends that you schedule a follow-up appointment in: AAA NEEDED  Any Other Special Instructions Will Be Listed Below (If Applicable).     If you need a refill on your cardiac medications before your next appointment, please call your pharmacy.    Increase your intake of fluids (water with electrolyte tabs like Nun tablets, or gatorade) , protein ( hard boiled eggs, chicken, fish) , and a electrolytes ( V-8 juice, salt, potassium chloride  which is sold as No-Salt

## 2015-10-12 ENCOUNTER — Other Ambulatory Visit: Payer: Self-pay | Admitting: Internal Medicine

## 2015-10-13 ENCOUNTER — Other Ambulatory Visit: Payer: Self-pay | Admitting: Internal Medicine

## 2015-10-14 NOTE — Telephone Encounter (Signed)
Sent to the pharmacy by e-scribe. 

## 2015-10-15 NOTE — Telephone Encounter (Signed)
Sent to the pharmacy by e-scribe. 

## 2015-10-18 ENCOUNTER — Other Ambulatory Visit: Payer: Self-pay | Admitting: Cardiovascular Disease

## 2015-10-18 DIAGNOSIS — R42 Dizziness and giddiness: Secondary | ICD-10-CM

## 2015-10-18 DIAGNOSIS — R0989 Other specified symptoms and signs involving the circulatory and respiratory systems: Secondary | ICD-10-CM

## 2015-10-18 DIAGNOSIS — I951 Orthostatic hypotension: Secondary | ICD-10-CM

## 2015-10-18 DIAGNOSIS — F17200 Nicotine dependence, unspecified, uncomplicated: Secondary | ICD-10-CM

## 2015-10-21 ENCOUNTER — Ambulatory Visit (INDEPENDENT_AMBULATORY_CARE_PROVIDER_SITE_OTHER): Payer: Medicare Other | Admitting: Internal Medicine

## 2015-10-21 ENCOUNTER — Encounter: Payer: Self-pay | Admitting: Internal Medicine

## 2015-10-21 VITALS — BP 126/72 | Temp 98.6°F | Ht 62.0 in | Wt 104.0 lb

## 2015-10-21 DIAGNOSIS — Z9189 Other specified personal risk factors, not elsewhere classified: Secondary | ICD-10-CM

## 2015-10-21 DIAGNOSIS — G479 Sleep disorder, unspecified: Secondary | ICD-10-CM | POA: Diagnosis not present

## 2015-10-21 DIAGNOSIS — F411 Generalized anxiety disorder: Secondary | ICD-10-CM

## 2015-10-21 DIAGNOSIS — I951 Orthostatic hypotension: Secondary | ICD-10-CM | POA: Diagnosis not present

## 2015-10-21 MED ORDER — SUVOREXANT 10 MG PO TABS: 10.0000 | ORAL_TABLET | Freq: Every day | ORAL | Status: DC

## 2015-10-21 NOTE — Patient Instructions (Addendum)
Consideration      Of  Weaning from most medication .   But sleep may not be controlled   In the short  run.   Can try belsomra  For sleep but doesn"   help anxiety . 10 mg at night  Then contact us for next rx  15 mg    10 pills  And possible 20 mg  10 pills.    Stop the tylenol pm.  Decrease the amitryptiline  To  2 pills at night   For a week and then   Decrease to   1 per night  50 mg . Begin the belsomra   After decreasing the amitryptiline   And off the tylenol pm.   Also try decreasing the gabapentin to   1 at night    And then off if possible.   soince back is better now.     Insomnia Insomnia is a sleep disorder that makes it difficult to fall asleep or to stay asleep. Insomnia can cause tiredness (fatigue), low energy, difficulty concentrating, mood swings, and poor performance at work or school.  There are three different ways to classify insomnia:  Difficulty falling asleep.  Difficulty staying asleep.  Waking up too early in the morning. Any type of insomnia can be long-term (chronic) or short-term (acute). Both are common. Short-term insomnia usually lasts for three months or less. Chronic insomnia occurs at least three times a week for longer than three months. CAUSES  Insomnia may be caused by another condition, situation, or substance, such as:  Anxiety.  Certain medicines.  Gastroesophageal reflux disease (GERD) or other gastrointestinal conditions.  Asthma or other breathing conditions.  Restless legs syndrome, sleep apnea, or other sleep disorders.  Chronic pain.  Menopause. This may include hot flashes.  Stroke.  Abuse of alcohol, tobacco, or illegal drugs.  Depression.  Caffeine.   Neurological disorders, such as Alzheimer disease.  An overactive thyroid (hyperthyroidism). The cause of insomnia may not be known. RISK FACTORS Risk factors for insomnia include:  Gender. Women are more commonly affected than men.  Age. Insomnia is more  common as you get older.  Stress. This may involve your professional or personal life.  Income. Insomnia is more common in people with lower income.  Lack of exercise.   Irregular work schedule or night shifts.  Traveling between different time zones. SIGNS AND SYMPTOMS If you have insomnia, trouble falling asleep or trouble staying asleep is the main symptom. This may lead to other symptoms, such as:  Feeling fatigued.  Feeling nervous about going to sleep.  Not feeling rested in the morning.  Having trouble concentrating.  Feeling irritable, anxious, or depressed. TREATMENT  Treatment for insomnia depends on the cause. If your insomnia is caused by an underlying condition, treatment will focus on addressing the condition. Treatment may also include:   Medicines to help you sleep.  Counseling or therapy.  Lifestyle adjustments. HOME CARE INSTRUCTIONS   Take medicines only as directed by your health care provider.  Keep regular sleeping and waking hours. Avoid naps.  Keep a sleep diary to help you and your health care provider figure out what could be causing your insomnia. Include:   When you sleep.  When you wake up during the night.  How well you sleep.   How rested you feel the next day.  Any side effects of medicines you are taking.  What you eat and drink.   Make your bedroom a comfortable place  where it is easy to fall asleep:  Put up shades or special blackout curtains to block light from outside.  Use a white noise machine to block noise.  Keep the temperature cool.   Exercise regularly as directed by your health care provider. Avoid exercising right before bedtime.  Use relaxation techniques to manage stress. Ask your health care provider to suggest some techniques that may work well for you. These may include:  Breathing exercises.  Routines to release muscle tension.  Visualizing peaceful scenes.  Cut back on alcohol, caffeinated  beverages, and cigarettes, especially close to bedtime. These can disrupt your sleep.  Do not overeat or eat spicy foods right before bedtime. This can lead to digestive discomfort that can make it hard for you to sleep.  Limit screen use before bedtime. This includes:  Watching TV.  Using your smartphone, tablet, and computer.  Stick to a routine. This can help you fall asleep faster. Try to do a quiet activity, brush your teeth, and go to bed at the same time each night.  Get out of bed if you are still awake after 15 minutes of trying to sleep. Keep the lights down, but try reading or doing a quiet activity. When you feel sleepy, go back to bed.  Make sure that you drive carefully. Avoid driving if you feel very sleepy.  Keep all follow-up appointments as directed by your health care provider. This is important. SEEK MEDICAL CARE IF:   You are tired throughout the day or have trouble in your daily routine due to sleepiness.  You continue to have sleep problems or your sleep problems get worse. SEEK IMMEDIATE MEDICAL CARE IF:   You have serious thoughts about hurting yourself or someone else.   This information is not intended to replace advice given to you by your health care provider. Make sure you discuss any questions you have with your health care provider.   Document Released: 08/07/2000 Document Revised: 05/01/2015 Document Reviewed: 05/11/2014 Elsevier Interactive Patient Education [employer].

## 2015-10-21 NOTE — Progress Notes (Signed)
Pre visit review using our clinic review tool, if applicable. No additional management support is needed unless otherwise documented below in the visit note.  Chief Complaint  Patient presents with  . Follow-up    here with husband    HPI: [name] 70 y.o. comes in for fu of bp issues   And noted since last visit to have sig orthostatic changes that became sx when increase acei dose. Her husband is here with her today .  Card attributed her sx from not eating right and antihypertensiove med    But also told her to add protein and sodium  Told to eat better and since bp was ok in office to rehceck prn and stay off med  Didn't mention the tachy baseline    Did Plan  aaa screen  Cause of hx of tobacco and  pulsatile    At this time   Issues continues to be sleep and anxiety about not sleeping     Takes 5 things pre bed  Including  Benadryl pm cause   Anxiety and sleep  And  Worries about not sleeping   4-5 am   8-9 sleep  Takes  6 30 all of them at night tylenol pm .   Taking many meds to make sure can sleep.   Gabapentin    was given for back and leg pain but this is better  Taking 2 hs and as needed day ( not needed)   Work around 7 .   Episode in middle of night when to get to br wa off balance   Acted like stroke.   Also  this weekend legs like jelly when up at night   ROS: See pertinent positives and negatives per HPI.  Past Medical History  Diagnosis Date  . Hypothyroidism   . Rosacea   . GERD (gastroesophageal reflux disease)     WOULD USE OTC MEDICATION IF NEEDED  . 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 Tachycardia     PT STATES HER HEART RATE USUALLY ABOVE 100    Family History  Problem Relation Age of Onset  . Heart disease    . Pancreatic cancer Brother     Social History   Social History  . Marital Status: Married    Spouse Name: N/A  . Number of Children: N/A  . Years of Education: N/A   Social History Main Topics  . Smoking  status: Current Every Day Smoker -- 1.00 packs/day    Types: Cigarettes  . Smokeless tobacco: Never Used     Comment: back to tobacco 10 per day   . Alcohol Use: 0.0 oz/week    0 Standard drinks or equivalent per week  . Drug Use: No  . Sexual Activity: Not Asked   Other Topics Concern  . 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    No health insurance self employed  Now on medicare    2 dogs     Outpatient Prescriptions Prior to Visit  Medication Sig Dispense Refill  . amitriptyline (ELAVIL) 100 MG tablet TAKE 3 TABLETS BY MOUTH EVERY DAY 270 tablet 1  . Ascorbic Acid (VITAMIN C WITH ROSE HIPS) 1000 MG tablet Take 1,000 mg by mouth daily.    Marland Kitchen  Biotin 5000 MCG TABS Take 5,000 mcg by mouth daily.     . DULoxetine (CYMBALTA) 20 MG capsule TAKE 1 CAPSULE BY MOUTH DAILY. 90 capsule 1  . gabapentin (NEURONTIN) 300 MG capsule TAKE ONE CAPSULE IN THE DAYTIME AND 2 CAPSULE AT BEDTIME 90 capsule 5  . meloxicam (MOBIC) 15 MG tablet TAKE 1 TABLET (15 MG TOTAL) BY MOUTH DAILY. AS NEEDED FOR PAIN 30 tablet 2  . Multiple Vitamins-Minerals (MULTIVITAMIN WITH MINERALS) tablet Take 1 tablet by mouth daily.    . polyethylene glycol (MIRALAX / GLYCOLAX) packet Take 17 g by mouth daily as needed (for constipation).     . SYNTHROID 75 MCG tablet TAKE 1 TABLET BY MOUTH DAILY BEFORE BREAKFAST. 90 tablet 1  . benzonatate (TESSALON) 200 MG capsule Take 1 capsule (200 mg total) by mouth 3 (three) times daily as needed for cough. 20 capsule 0  . erythromycin ophthalmic ointment APPLY OINTMENT TO BOTH EYES AT BEDTIME  2  . HYDROcodone-homatropine (HYCODAN) 5-1.5 MG/5ML syrup Take 5 mLs by mouth every 8 (eight) hours as needed for cough. 120 mL 0  . lisinopril (PRINIVIL,ZESTRIL) 10 MG tablet Take 1-2 tablets (10-20 mg total) by mouth daily. (Patient not taking: Reported on  10/11/2015) 180 tablet 0  . tretinoin (RETIN-A) 0.025 % cream Reported on 08/20/2015  0   No facility-administered medications prior to visit.     EXAM:  BP 126/72 mmHg  Temp(Src) 98.6 F (37 C) (Oral)  Ht '5\' 2"'$  (1.575 m)  Wt 104 lb (47.174 kg)  BMI 19.02 kg/m2  Body mass index is 19.02 kg/(m^2).  GENERAL: vitals reviewed and listed above, alert, oriented, appears well hydrated and in no acute distress HEENT: atraumatic, conjunctiva  clear, no obvious abnormalities on inspection of external nose and earsMS: moves all extremities without noticeable focal  abnormality PSYCH: pleasant and cooperative, no obvious depression  Cognition intact and nl gaitt  No tremor  But baseline  anxiety BP Readings from Last 3 Encounters:  10/21/15 126/72  10/11/15 130/60  09/17/15 122/80   Wt Readings from Last 3 Encounters:  10/21/15 104 lb (47.174 kg)  10/11/15 105 lb (47.628 kg)  09/17/15 102 lb 6.4 oz (46.448 kg)   ASSESSMENT AND PLAN:  Discussed the following assessment and plan:  Orthostatic hypotension  Disturbance in sleep behavior  Anxiety state  At risk for polypharmacy Patient is very concerned that she will  Not be able to sleep so she takes all her medicines at night the same time around 6:30 but then goes to bed at 8 may falll asleep  front of the TV which puts her at a risk of falling. She's been on  300 mg of amitriptyline for many years and we haven't changed it because she was resistant in past ,  worried that she wouldn't be able to sleep. Although the road could be [place] in the short runs I discussed with her and her husband option of trying to wean off most of these medications and then add back what might be helpful. 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 If she is having a balance problem I would like neurology to follow-up because it is not felt by the family to  be related to back and legs.   Her orthostatic hypotension could be related to fluid status and eating as well as medications. Should monitor occasional. Option of [place]  [place] coupon given expectant management. I think she would also benefit from cognitive training about sleep but don't ever person specifically to refer. She states that Vicodin just have one pills and I'm sure will make sleep I can get off all the others. -Patient advised to return or notify health care team  if symptoms worsen ,persist or new concerns arise. Total visit 40 mins > 50% spent counseling and coordinating care as indicated in above note and in instructions to patient .     Patient Instructions  Consideration      Of  Weaning from most medication .   But sleep may not be controlled   In the short  run.   Can try belsomra  For sleep but doesn"   help anxiety . 10 mg at night  Then contact us for next rx  15 mg    10 pills  And possible 20 mg  10 pills.    Stop the tylenol pm.  Decrease the amitryptiline  To  2 pills at night   For a week and then   Decrease to   1 per night  50 mg . Begin the belsomra   After decreasing the amitryptiline   And off the tylenol pm.   Also try decreasing the gabapentin to   1 at night    And then off if possible.   soince back is better now.     Insomnia Insomnia is a sleep disorder that makes it difficult to fall asleep or to stay asleep. Insomnia can cause tiredness (fatigue), low energy, difficulty concentrating, mood swings, and poor performance at work or school.  There are three different ways to classify insomnia:  Difficulty falling asleep.  Difficulty staying asleep.  Waking up too early in the morning. Any type of insomnia can be long-term (chronic) or short-term (acute). Both are common. Short-term insomnia usually lasts for three months or less. Chronic insomnia occurs at least three times a week for longer than three months. CAUSES  Insomnia may be caused by  another condition, situation, or substance, such as:  Anxiety.  Certain medicines.  Gastroesophageal reflux disease (GERD) or other gastrointestinal conditions.  Asthma or other breathing conditions.  Restless legs syndrome, sleep apnea, or other sleep disorders.  Chronic pain.  Menopause. This may include hot flashes.  Stroke.  Abuse of alcohol, tobacco, or illegal drugs.  Depression.  Caffeine.   Neurological disorders, such as Alzheimer disease.  An overactive thyroid (hyperthyroidism). The cause of insomnia may not be known. RISK FACTORS Risk factors for insomnia include:  Gender. Women are more commonly affected than men.  Age. Insomnia is more common as you get older.  Stress. This may involve your professional or personal life.  Income. Insomnia is more common in people with lower income.  Lack of exercise.   Irregular work schedule or night shifts.  Traveling between different time zones. SIGNS AND SYMPTOMS If you have insomnia, trouble falling asleep or trouble staying asleep is the main symptom. This may lead to other symptoms, such as:  Feeling fatigued.  Feeling nervous about going to sleep.  Not feeling rested in the morning.  Having trouble concentrating.  Feeling irritable, anxious, or depressed. TREATMENT  Treatment for insomnia depends on the cause. If your insomnia is caused by an underlying condition, treatment will focus on addressing the condition. Treatment may also include:   Medicines to help you sleep.  Counseling or therapy.  Lifestyle adjustments. HOME CARE INSTRUCTIONS  Take medicines only as directed by your health care provider.  Keep regular sleeping and waking hours. Avoid naps.  Keep a sleep diary to help you and your health care provider figure out what could be causing your insomnia. Include:   When you sleep.  When you wake up during the night.  How well you sleep.   How rested you feel the next  day.  Any side effects of medicines you are taking.  What you eat and drink.   Make your bedroom a comfortable place where it is easy to fall asleep:  Put up shades or special blackout curtains to block light from outside.  Use a white noise machine to block noise.  Keep the temperature cool.   Exercise regularly as directed by your health care provider. Avoid exercising right before bedtime.  Use relaxation techniques to manage stress. Ask your health care provider to suggest some techniques that may work well for you. These may include:  Breathing exercises.  Routines to release muscle tension.  Visualizing peaceful scenes.  Cut back on alcohol, caffeinated beverages, and cigarettes, especially close to bedtime. These can disrupt your sleep.  Do not overeat or eat spicy foods right before bedtime. This can lead to digestive discomfort that can make it hard for you to sleep.  Limit screen use before bedtime. This includes:  Watching TV.  Using your smartphone, tablet, and computer.  Stick to a routine. This can help you fall asleep faster. Try to do a quiet activity, brush your teeth, and go to bed at the same time each night.  Get out of bed if you are still awake after 15 minutes of trying to sleep. Keep the lights down, but try reading or doing a quiet activity. When you feel sleepy, go back to bed.  Make sure that you drive carefully. Avoid driving if you feel very sleepy.  Keep all follow-up appointments as directed by your health care provider. This is important. SEEK MEDICAL CARE IF:   You are tired throughout the day or have trouble in your daily routine due to sleepiness.  You continue to have sleep problems or your sleep problems get worse. SEEK IMMEDIATE MEDICAL CARE IF:   You have serious thoughts about hurting yourself or someone else.   This information is not intended to replace advice given to you by your health care provider. Make sure you discuss  any questions you have with your health care provider.   Document Released: 08/07/2000 Document Revised: 05/01/2015 Document Reviewed: 05/11/2014 Elsevier Interactive Patient Education 2016 [place] K. Panosh M.D.

## 2015-10-23 ENCOUNTER — Ambulatory Visit (HOSPITAL_BASED_OUTPATIENT_CLINIC_OR_DEPARTMENT_OTHER)
Admission: RE | Admit: 2015-10-23 | Discharge: 2015-10-23 | Disposition: A | Discharge status: Home / Self Care | Payer: Medicare Other | Source: Ambulatory Visit | Attending: Cardiovascular Disease | Admitting: Cardiovascular Disease

## 2015-10-23 ENCOUNTER — Ambulatory Visit (HOSPITAL_COMMUNITY)
Admission: RE | Admit: 2015-10-23 | Discharge: 2015-10-23 | Disposition: A | Discharge status: Home / Self Care | Payer: Medicare Other | Source: Ambulatory Visit | Attending: Cardiology | Admitting: Cardiology

## 2015-10-23 DIAGNOSIS — R42 Dizziness and giddiness: Secondary | ICD-10-CM

## 2015-10-23 DIAGNOSIS — I071 Rheumatic tricuspid insufficiency: Secondary | ICD-10-CM | POA: Diagnosis not present

## 2015-10-23 DIAGNOSIS — I340 Nonrheumatic mitral (valve) insufficiency: Secondary | ICD-10-CM | POA: Diagnosis not present

## 2015-10-23 DIAGNOSIS — Z720 Tobacco use: Secondary | ICD-10-CM | POA: Insufficient documentation

## 2015-10-23 DIAGNOSIS — R0989 Other specified symptoms and signs involving the circulatory and respiratory systems: Secondary | ICD-10-CM

## 2015-10-23 DIAGNOSIS — I951 Orthostatic hypotension: Secondary | ICD-10-CM | POA: Insufficient documentation

## 2015-10-23 DIAGNOSIS — I119 Hypertensive heart disease without heart failure: Secondary | ICD-10-CM | POA: Insufficient documentation

## 2015-10-23 DIAGNOSIS — I700 Atherosclerosis of aorta: Secondary | ICD-10-CM | POA: Diagnosis not present

## 2015-10-23 DIAGNOSIS — F17200 Nicotine dependence, unspecified, uncomplicated: Secondary | ICD-10-CM

## 2015-11-11 ENCOUNTER — Other Ambulatory Visit: Payer: Self-pay | Admitting: Internal Medicine

## 2015-11-12 NOTE — Telephone Encounter (Signed)
Denied.  Pt is no longer taking this medication. 

## 2015-11-18 ENCOUNTER — Ambulatory Visit (INDEPENDENT_AMBULATORY_CARE_PROVIDER_SITE_OTHER): Payer: Medicare Other | Admitting: Internal Medicine

## 2015-11-18 ENCOUNTER — Encounter: Payer: Self-pay | Admitting: Internal Medicine

## 2015-11-18 VITALS — BP 130/90 | HR 102 | Temp 97.8°F | Wt 103.8 lb

## 2015-11-18 DIAGNOSIS — G479 Sleep disorder, unspecified: Secondary | ICD-10-CM

## 2015-11-18 DIAGNOSIS — M48062 Spinal stenosis, lumbar region with neurogenic claudication: Secondary | ICD-10-CM

## 2015-11-18 DIAGNOSIS — E039 Hypothyroidism, unspecified: Secondary | ICD-10-CM

## 2015-11-18 DIAGNOSIS — F411 Generalized anxiety disorder: Secondary | ICD-10-CM

## 2015-11-18 DIAGNOSIS — M4806 Spinal stenosis, lumbar region: Secondary | ICD-10-CM

## 2015-11-18 DIAGNOSIS — Z79899 Other long term (current) drug therapy: Secondary | ICD-10-CM

## 2015-11-18 MED ORDER — DULOXETINE HCL 30 MG PO CPEP: 30.0000 mg | ORAL_CAPSULE | Freq: Every day | ORAL | Status: DC

## 2015-11-18 MED ORDER — LEVOTHYROXINE SODIUM 75 MCG PO TABS: ORAL_TABLET | ORAL | Status: DC

## 2015-11-18 NOTE — Patient Instructions (Addendum)
Ok to try  Generic  Levothyroxine      And plan fu tsh in 2-3 months . After changing    Would try inc cymbalta  30 mg per day  ( shouldn't be a cost difference)   Would still try to dec the amitryptiline   If needed can try  The belsomra at a higher dose .    Plan  ROV in about 3 months   Get thryoid test before  Visit

## 2015-11-18 NOTE — Progress Notes (Signed)
Chief Complaint  Patient presents with  . Follow-up    meds and orthosatic hypotension    HPI: [name] 70 y.o.   Here alone today Fu rtho hypotension and  meds and sleep   Stopped  Dec gaba[entin to 2  And has more energy  Some of her back pain comes back worse when squatting ! ( work)  No more syncope sx  And bp creeping up  No bp meds at this time  reaslly wants to stay on tca worried she may not be able to sleep  Is anxious about sleep  As she is working .  We have added cymbalta 20 mg   Lower dose for pain  anxiety  ROS: See pertinent positives and negatives per HPI.  Past Medical History  Diagnosis Date  . Hypothyroidism   . Rosacea   . GERD (gastroesophageal reflux disease)     WOULD USE OTC MEDICATION IF NEEDED  . 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 Tachycardia     PT STATES HER HEART RATE USUALLY ABOVE 100    Family History  Problem Relation Age of Onset  . Heart disease    . Pancreatic cancer Brother     Social History   Social History  . Marital Status: Married    Spouse Name: N/A  . Number of Children: N/A  . Years of Education: N/A   Social History Main Topics  . Smoking status: Current Every Day Smoker -- 1.00 packs/day    Types: Cigarettes  . Smokeless tobacco: Never Used     Comment: back to tobacco 10 per day   . Alcohol Use: 0.0 oz/week    0 Standard drinks or equivalent per week  . Drug Use: No  . Sexual Activity: Not Asked   Other Topics Concern  . 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    No health insurance self employed  Now on medicare    2 dogs     Outpatient Prescriptions Prior to Visit  Medication Sig Dispense Refill  . amitriptyline (ELAVIL) 100 MG tablet TAKE 3 TABLETS BY MOUTH EVERY DAY 270 tablet 1  . Ascorbic Acid (VITAMIN C WITH ROSE HIPS) 1000 MG tablet  Take 1,000 mg by mouth daily.    . Biotin 5000 MCG TABS Take 5,000 mcg by mouth daily.     . DULoxetine (CYMBALTA) 20 MG capsule TAKE 1 CAPSULE BY MOUTH DAILY. 90 capsule 1  . gabapentin (NEURONTIN) 300 MG capsule TAKE ONE CAPSULE IN THE DAYTIME AND 2 CAPSULE AT BEDTIME 90 capsule 5  . meloxicam (MOBIC) 15 MG tablet TAKE 1 TABLET (15 MG TOTAL) BY MOUTH DAILY. AS NEEDED FOR PAIN 30 tablet 2  . Multiple Vitamins-Minerals (MULTIVITAMIN WITH MINERALS) tablet Take 1 tablet by mouth daily.    . polyethylene glycol (MIRALAX / GLYCOLAX) packet Take 17 g by mouth daily as needed (for constipation).     . Suvorexant (BELSOMRA) 10 MG TABS Take 10 tablets by mouth at bedtime. 10 tablet 0  . SYNTHROID 75 MCG tablet TAKE 1 TABLET BY MOUTH DAILY BEFORE BREAKFAST. 90 tablet 1   No facility-administered medications prior to visit.     EXAM:  BP 130/90 mmHg  Pulse 102  Temp(Src) 97.8  F (36.6 C) (Oral)  Wt 103 lb 12.8 oz (47.083 kg)  Body mass index is 18.98 kg/(m^2).  GENERAL: vitals reviewed and listed above, alert, oriented, appears well hydrated and in no acute distress looks more relaxed today  HEENT: atraumatic, conjunctiva  clear, no obvious abnormalities on inspection of external nose and ears  cv rr no g or m  MS: moves all extremities without noticeable focal  abnormality PSYCH: pleasant and cooperative, no obvious depression   Talkative  Mild anxiety looks hap Lab Results  Component Value Date   WBC 6.6 08/13/2015   HGB 12.3 08/13/2015   HCT 37.0 08/13/2015   PLT 306.0 08/13/2015   GLUCOSE 86 08/13/2015   CHOL 162 08/13/2015   TRIG 102.0 08/13/2015   HDL 58.90 08/13/2015   LDLDIRECT 132.5 06/23/2010   LDLCALC 82 08/13/2015   ALT 7 08/13/2015   AST 13 08/13/2015   NA 138 08/13/2015   K 3.8 08/13/2015   CL 104 08/13/2015   CREATININE 0.92 08/13/2015   BUN 11 08/13/2015   CO2 29 08/13/2015   TSH 0.81 08/13/2015   INR 1.04 04/30/2014   HGBA1C 5.5 06/18/2008   BP Readings from  Last 3 Encounters:  11/18/15 130/90  10/21/15 126/72  10/11/15 130/60    ASSESSMENT AND PLAN:  Discussed the following assessment and plan:  Disturbance in sleep behavior  Anxiety state  Medication management  Spinal stenosis, lumbar region, with neurogenic claudication  Hypothyroidism, unspecified hypothyroidism type  Pt says so much betrter after stopping t pm  Dec gaba t 2 hs some help   Doesn't want to dec tca as  has helped her  But tolerating the 20 mg cymbalta     Cost of meds is an issue   Also monthly .  Can we do all generics?  Trial generic synthroid   When refill  And  Then check tsh  After 2-3 months .  Told her to not give up on the belsomra if needed in future 10 mg not that helpful   Risk benefit of medications discussed.  -Patient advised to return or notify health care team  if symptoms worsen ,persist or new concerns arise. Total visit 54mns > 50% spent counseling and coordinating care as indicated in above note and in instructions to patient .     Patient Instructions  Ok to try  Generic  Levothyroxine      And plan fu tsh in 2-3 months . After changing    Would try inc cymbalta  30 mg per day  ( shouldn't be a cost difference)   Would still try to dec the amitryptiline   If needed can try  The belsomra at a higher dose .    Plan  ROV in about 3 months   Get thryoid test before  Visit      WStandley Brooking Doryan Bahl M.D.

## 2015-11-18 NOTE — Progress Notes (Signed)
Pre visit review using our clinic review tool, if applicable. No additional management support is needed unless otherwise documented below in the visit note. 

## 2016-01-05 ENCOUNTER — Other Ambulatory Visit: Payer: Self-pay | Admitting: Internal Medicine

## 2016-01-08 NOTE — Telephone Encounter (Signed)
Ok to refill as wtitten  Sent in to pharmacy

## 2016-01-22 DIAGNOSIS — L812 Freckles: Secondary | ICD-10-CM | POA: Diagnosis not present

## 2016-01-22 DIAGNOSIS — L570 Actinic keratosis: Secondary | ICD-10-CM | POA: Diagnosis not present

## 2016-01-22 DIAGNOSIS — Z85828 Personal history of other malignant neoplasm of skin: Secondary | ICD-10-CM | POA: Diagnosis not present

## 2016-01-22 DIAGNOSIS — D1801 Hemangioma of skin and subcutaneous tissue: Secondary | ICD-10-CM | POA: Diagnosis not present

## 2016-01-22 DIAGNOSIS — L821 Other seborrheic keratosis: Secondary | ICD-10-CM | POA: Diagnosis not present

## 2016-02-10 ENCOUNTER — Other Ambulatory Visit: Payer: Medicare Other

## 2016-02-13 ENCOUNTER — Other Ambulatory Visit (INDEPENDENT_AMBULATORY_CARE_PROVIDER_SITE_OTHER): Payer: Medicare Other

## 2016-02-13 DIAGNOSIS — E039 Hypothyroidism, unspecified: Secondary | ICD-10-CM

## 2016-02-13 LAB — TSH: TSH: 1.63 u[IU]/mL (ref 0.35–4.50)

## 2016-02-17 ENCOUNTER — Encounter: Payer: Self-pay | Admitting: Internal Medicine

## 2016-02-17 ENCOUNTER — Ambulatory Visit (INDEPENDENT_AMBULATORY_CARE_PROVIDER_SITE_OTHER): Payer: Medicare Other | Admitting: Internal Medicine

## 2016-02-17 VITALS — BP 116/72 | Temp 98.2°F | Ht 62.0 in | Wt 105.5 lb

## 2016-02-17 DIAGNOSIS — G479 Sleep disorder, unspecified: Secondary | ICD-10-CM | POA: Diagnosis not present

## 2016-02-17 DIAGNOSIS — Z9189 Other specified personal risk factors, not elsewhere classified: Secondary | ICD-10-CM

## 2016-02-17 DIAGNOSIS — M792 Neuralgia and neuritis, unspecified: Secondary | ICD-10-CM | POA: Diagnosis not present

## 2016-02-17 DIAGNOSIS — E039 Hypothyroidism, unspecified: Secondary | ICD-10-CM

## 2016-02-17 DIAGNOSIS — Z79899 Other long term (current) drug therapy: Secondary | ICD-10-CM | POA: Diagnosis not present

## 2016-02-17 DIAGNOSIS — M48062 Spinal stenosis, lumbar region with neurogenic claudication: Secondary | ICD-10-CM

## 2016-02-17 DIAGNOSIS — M546 Pain in thoracic spine: Secondary | ICD-10-CM

## 2016-02-17 DIAGNOSIS — M4806 Spinal stenosis, lumbar region: Secondary | ICD-10-CM

## 2016-02-17 MED ORDER — MELOXICAM 15 MG PO TABS: ORAL_TABLET | ORAL | Status: DC

## 2016-02-17 MED ORDER — LEVOTHYROXINE SODIUM 75 MCG PO TABS: ORAL_TABLET | ORAL | Status: AC

## 2016-02-17 MED ORDER — LEVOTHYROXINE SODIUM 75 MCG PO TABS: ORAL_TABLET | ORAL | Status: DC

## 2016-02-17 NOTE — Patient Instructions (Addendum)
Maximum  Gabapentin   At night would be 900 mg  . Your bp is lower today and the grogginess is probably from the medication .  Taking too much can give side effect  Without   Fixing the pain .  amitryptiline   Thyroid is good.    Do a chest x ray .  To check rib area and such .  Make appt with Dr Nelva Bush about what to do .  Call us if we need to help with this.   Will refill your gen thyroid med   To continue for now .   ROV in  3 months assuming you are seeing specialist for the   Pain and tingling .

## 2016-02-17 NOTE — Progress Notes (Signed)
Pre visit review using our clinic review tool, if applicable. No additional management support is needed unless otherwise documented below in the visit note.  Chief Complaint  Patient presents with  . Follow-up    HPI: [name] 70 y.o.  Comes in today for follow-up of her thyroid test after changing from brand to generic. She's not having a good day as she says she is having persistent left back and leg pain and now has Pins and needles   At night right back  Leg and to see  Ortho  And rib area.  3 weeks . There is been no injury rash or new change in her health otherwise asked for refill of mobile.   To try to help her discomfort she Took  4  Gabapentin  Last pm cause of pain ?   ( 1200 mg!)  Is drowsy and hard to concentrate today. She is on amitriptyline 300 mg at night and states she's been on that for years and works real well for her she has lots of stress in her life financial etc. as if she can increase the dose. She never took the Bellsiomra  She is taking Cymbalta uncertain if she is taking the 20 mg in the 30 mg  She is not having hypotension.   Patient Instructions  Ok to try Generic Levothyroxine And plan fu tsh in 2-3 months . After changing   Would try inc cymbalta 30 mg per day ( shouldn't be a cost difference)   Would still try to dec the amitryptiline   If needed can try The belsomra at a higher dose .    Plan ROV in about 3 months Get thryoid test before Visit      Standley Brooking. Seung Nidiffer M.D.      ROS: See pertinent positives and negatives per HPI.  Past Medical History  Diagnosis Date  . Hypothyroidism   . Rosacea   . GERD (gastroesophageal reflux disease)     WOULD USE OTC MEDICATION IF NEEDED  . 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 Tachycardia     PT STATES HER HEART RATE USUALLY ABOVE 100    Family History  Problem Relation Age of Onset  . Heart disease    . Pancreatic cancer Brother     Social History     Social History  . Marital Status: Married    Spouse Name: N/A  . Number of Children: N/A  . Years of Education: N/A   Social History Main Topics  . Smoking status: Current Every Day Smoker -- 1.00 packs/day    Types: Cigarettes  . Smokeless tobacco: Never Used     Comment: back to tobacco 10 per day   . Alcohol Use: 0.0 oz/week    0 Standard drinks or equivalent per week  . Drug Use: No  . Sexual Activity: Not Asked   Other Topics Concern  . 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    No health insurance self employed  Now on medicare    2 dogs     Outpatient Prescriptions Prior to Visit  Medication Sig Dispense Refill  . amitriptyline (ELAVIL) 100 MG tablet TAKE 3 TABLETS BY MOUTH EVERY DAY 270 tablet 1  . Ascorbic Acid (  VITAMIN C WITH ROSE HIPS) 1000 MG tablet Take 1,000 mg by mouth daily.    . Biotin 5000 MCG TABS Take 5,000 mcg by mouth daily.     . DULoxetine (CYMBALTA) 20 MG capsule TAKE 1 CAPSULE BY MOUTH DAILY. 90 capsule 1  . DULoxetine (CYMBALTA) 30 MG capsule Take 1 capsule (30 mg total) by mouth daily. 30 capsule 3  . gabapentin (NEURONTIN) 300 MG capsule TAKE ONE CAPSULE IN THE DAYTIME AND 2 CAPSULE AT BEDTIME 90 capsule 5  . Multiple Vitamins-Minerals (MULTIVITAMIN WITH MINERALS) tablet Take 1 tablet by mouth daily.    . polyethylene glycol (MIRALAX / GLYCOLAX) packet Take 17 g by mouth daily as needed (for constipation).     . Suvorexant (BELSOMRA) 10 MG TABS Take 10 tablets by mouth at bedtime. 10 tablet 0  . levothyroxine (SYNTHROID) 75 MCG tablet TAKE 1 TABLET BY MOUTH DAILY BEFORE BREAKFAST. 90 tablet 1  . meloxicam (MOBIC) 15 MG tablet TAKE 1 TABLET (15 MG TOTAL) BY MOUTH DAILY. AS NEEDED FOR PAIN 30 tablet 2   No facility-administered medications prior to visit.     EXAM:  BP 116/72 mmHg  Temp(Src) 98.2 F  (36.8 C) (Oral)  Ht '5\' 2"'$  (1.575 m)  Wt 105 lb 8 oz (47.854 kg)  BMI 19.29 kg/m2  Body mass index is 19.29 kg/(m^2).  GENERAL: vitals reviewed and listed above, alert, oriented, appears well hydrated and in no acute distressHer speech is normal but she looks tired. Her gait is normal. HEENT: atraumatic, conjunctiva  clear, no obvious abnormalities on inspection of external nose and ears OP : no lesion edema or exudate  NECK: no obvious masses on inspection palpation  Chest nl resp cw no lesion no focal tenderess CV: HRRR, no clubbing cyanosis or  peripheral edema nl cap refill  MS: moves all extremities without noticeable focal  abnormality PSYCH: pleasant and cooperative, stress   Slower than usual  In respnse and memory  Lab Results  Component Value Date   WBC 6.6 08/13/2015   HGB 12.3 08/13/2015   HCT 37.0 08/13/2015   PLT 306.0 08/13/2015   GLUCOSE 86 08/13/2015   CHOL 162 08/13/2015   TRIG 102.0 08/13/2015   HDL 58.90 08/13/2015   LDLDIRECT 132.5 06/23/2010   LDLCALC 82 08/13/2015   ALT 7 08/13/2015   AST 13 08/13/2015   NA 138 08/13/2015   K 3.8 08/13/2015   CL 104 08/13/2015   CREATININE 0.92 08/13/2015   BUN 11 08/13/2015   CO2 29 08/13/2015   TSH 1.63 02/13/2016   INR 1.04 04/30/2014   HGBA1C 5.5 06/18/2008   Wt Readings from Last 3 Encounters:  02/17/16 105 lb 8 oz (47.854 kg)  11/18/15 103 lb 12.8 oz (47.083 kg)  10/21/15 104 lb (47.174 kg)   BP Readings from Last 3 Encounters:  02/17/16 116/72  11/18/15 130/90  10/21/15 126/72      ASSESSMENT AND PLAN:  Discussed the following assessment and plan:  Hypothyroidism, unspecified hypothyroidism type - Acceptable to remain on the generic TSH in range.  Medication management - I didn't intend for her to take to Cymbalta is just the 30 mg I'm not sure what she is really taking she's best to call us back.  Disturbance in sleep behavior  Neuritis - Plan: DG Chest 2 View  Right-sided thoracic back  pain - Plan: DG Chest 2 View  At risk for polypharmacy - She asked for higher dose of TCA denied would rather have  her lower dose she took too many gabapentin last night to try to relieve her pain she will not do that  Spinal stenosis, lumbar region, with neurogenic claudication She promises she will make an appointment with Dr. Jeanell Sparrow Mohs. Needs some help with pain management. She is self-medicating and told her this was very riskyat times. She won't do that again. Maximum dose of gabapentin even at night at 900 mg is high. Certainly would not increase her amitriptyline. Follow-up in 3 months or earlier. Get chest x-ray as I cannot define her tingling on the rightside but may be musculoskeletal neuritis. She has stress finances   Stress an issue -Patient advised to return or notify health care team  if symptoms worsen ,persist or new concerns arise.  Patient Instructions  Maximum  Gabapentin   At night would be 900 mg  . Your bp is lower today and the grogginess is probably from the medication .  Taking too much can give side effect  Without   Fixing the pain .  amitryptiline   Thyroid is good.    Do a chest x ray .  To check rib area and such .  Make appt with Dr Nelva Bush about what to do .  Call us if we need to help with this.   Will refill your gen thyroid med   To continue for now .   ROV in  3 months assuming you are seeing specialist for the   Pain and tingling .      Standley Brooking. Ameliana Brashear M.D.

## 2016-02-28 ENCOUNTER — Other Ambulatory Visit: Payer: Self-pay | Admitting: Internal Medicine

## 2016-02-28 NOTE — Telephone Encounter (Signed)
Sent to the pharmacy by e-scribe. 

## 2016-03-18 ENCOUNTER — Other Ambulatory Visit: Payer: Self-pay | Admitting: Family Medicine

## 2016-03-18 MED ORDER — GABAPENTIN 300 MG PO CAPS: ORAL_CAPSULE | ORAL | 0 refills | Status: DC

## 2016-03-18 MED ORDER — MELOXICAM 15 MG PO TABS: ORAL_TABLET | ORAL | 0 refills | Status: DC

## 2016-03-18 NOTE — Telephone Encounter (Signed)
Received a request to refill for 90 days instead of 30.  Sent to the pharmacy by e-scribe.

## 2016-03-30 ENCOUNTER — Ambulatory Visit: Payer: Medicare Other | Admitting: Neurology

## 2016-03-30 DIAGNOSIS — Z029 Encounter for administrative examinations, unspecified: Secondary | ICD-10-CM

## 2016-04-07 ENCOUNTER — Other Ambulatory Visit: Payer: Self-pay | Admitting: Internal Medicine

## 2016-04-08 ENCOUNTER — Other Ambulatory Visit: Payer: Self-pay | Admitting: Internal Medicine

## 2016-04-08 NOTE — Telephone Encounter (Signed)
DUPLICATE REQUEST.  SENT IN EARLIER THIS MORNING.

## 2016-04-08 NOTE — Telephone Encounter (Signed)
Sent to the pharmacy by e-scribe.  Pt has scheduled cpx on 08/25/16.

## 2016-04-17 ENCOUNTER — Telehealth: Payer: Self-pay | Admitting: Family Medicine

## 2016-04-17 NOTE — Telephone Encounter (Signed)
Patient's husband called and left a message on my machine.  Messages are not checked until the end of the day as stated in the message on the machine.  Husband states pt is having pain under her rt ribs and would like an appointment.  Husband left the patient's cell number as a return call.  Left a message stating she could call the front desk to schedule an appt and may call also to speak to a nurse over the weekend if needed.

## 2016-04-20 NOTE — Telephone Encounter (Signed)
Tried to reach the pt on 04/17/16 with a return call.  Left a message.  Left a message for pt today to call and make appt.  Will continue to follow.

## 2016-04-21 ENCOUNTER — Telehealth: Payer: Self-pay | Admitting: Internal Medicine

## 2016-04-21 NOTE — Telephone Encounter (Signed)
Patient Name: [name]  DOB: 02-05-1946    Initial Comment Caller states she has pain under her right rib cage, goes to her back.   Nurse Assessment  Nurse: Mallie Mussel, RN, Alveta Heimlich Date/Time Eilene Ghazi Time): 04/21/2016 10:01:13 AM  Confirm and document reason for call. If symptomatic, describe symptoms. You must click the next button to save text entered. ---Caller states that she has rib cage pain that goes to her back and her right arm. This began a month ago. This week, the pain is getting worse. She rates her pain as 10 on 0-10 scale. The pain is lower right rib area. Her daughter thinks it is her gallbladder or a kidney stone. Her daughter is a Marine scientist. Denies fever.  Has the patient traveled out of the country within the last 30 days? ---No  Does the patient have any new or worsening symptoms? ---Yes  Will a triage be completed? ---Yes  Related visit to physician within the last 2 weeks? ---No  Does the PT have any chronic conditions? (i.e. diabetes, asthma, etc.) ---No  Is this a behavioral health or substance abuse call? ---No     Guidelines    Guideline Title Affirmed Question Affirmed Notes  Abdominal Pain - Upper [1] SEVERE pain (e.g., excruciating) AND [2] present > 1 hour    Final Disposition User   Go to ED Now Mallie Mussel, RN, Alveta Heimlich    Comments  Caller's husband wants her to be seen at the office instead of ER. Advised him that I will forward this information to the office and someone will give them a call. He verbalized understanding.  I called the backline and gave the information to [place].   Referrals  GO TO FACILITY REFUSED   Disagree/Comply: Disagree  Disagree/Comply Reason: Disagree with instructions

## 2016-04-21 NOTE — Telephone Encounter (Signed)
Called and left message for pt to return  call to office to schedule and appointment.

## 2016-04-22 NOTE — Telephone Encounter (Signed)
Patient has spoke to triage.  See triage note.

## 2016-04-29 ENCOUNTER — Telehealth: Payer: Self-pay | Admitting: Family Medicine

## 2016-04-29 NOTE — Telephone Encounter (Signed)
Patient called and left a message on my machine.  Stated she is having another "episode."  Also wanted to know if she could have her physical on 05/19/16; the same day she is scheduled for her follow up.  She is scheduled for a cpx on 08/25/16.  Last cpx was on 08/20/2015.  Left a message for a return call.

## 2016-04-30 NOTE — Telephone Encounter (Signed)
Left a message for a return call.

## 2016-05-01 NOTE — Telephone Encounter (Signed)
Left message on patients mobile phone to please call us back and let us know she is doing okay. Stated she has an appointment with Dr. Regis Bill on 05-19-16.

## 2016-05-01 NOTE — Telephone Encounter (Signed)
Left a message for a return call.  Have tried to reach the pt multiple times.  Will now close the note.

## 2016-05-05 ENCOUNTER — Ambulatory Visit (INDEPENDENT_AMBULATORY_CARE_PROVIDER_SITE_OTHER): Payer: Medicare Other | Admitting: Neurology

## 2016-05-05 ENCOUNTER — Encounter: Payer: Self-pay | Admitting: Neurology

## 2016-05-05 VITALS — BP 126/68 | HR 82 | Temp 98.4°F | Ht 62.0 in | Wt 104.4 lb

## 2016-05-05 DIAGNOSIS — I671 Cerebral aneurysm, nonruptured: Secondary | ICD-10-CM

## 2016-05-05 DIAGNOSIS — R292 Abnormal reflex: Secondary | ICD-10-CM | POA: Diagnosis not present

## 2016-05-05 DIAGNOSIS — R296 Repeated falls: Secondary | ICD-10-CM | POA: Insufficient documentation

## 2016-05-05 NOTE — Progress Notes (Signed)
NEUROLOGY FOLLOW UP OFFICE NOTE  [name] [id number]  HISTORY OF PRESENT ILLNESS: I had the pleasure of seeing [name] in follow-up in the neurology clinic on 05/05/2016.  The patient was last seen more than a year ago for headaches and confusion. Since her last visit, she reports at least 20 falls, stating her balance is "awful." Last fall was a couple of weeks ago. No significant injuries with the falls. She had previously reported resolution of headaches, however has noticed recurrence with around 3 headaches a week, lasting an hour. She describes a sharp pain with no associated nausea, vomiting, photo/phonophobia. Stress has been a trigger. She gets good sleep. No further confusional episodes. She has occasional lightheadedness when standing up. She reports pain in the left thigh, as well as in the right arm and rib rib region. She was unable to do annual CTA head for follow-up of aneurysm, last scan on 07/03/14 which showed a 40m wide necked outpouching projecting laterally from the supraclinoid left ICA, consistent with a berry aneurysm. There is a slight caudally directed and partially calcified 217mcomponent. 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 No other aneurysms seen.  HPYHC:WCBJs a pleasant [age]o RH woman with a history of hypothyroidism, chronic back pain, in her usual state of health until 04/30/14 when she suddenly collapsed at home and felt unwell and "not with it." She called her daughter who noted slurred speech and brought her to the Urgent [facility] found that she had accidentally taken her evening medications of high dose amitriptyline '300mg'$ , gabapentin '300mg'$ , and hydrocodone that morning. She started feeling better and back to normal after several hours. She had an MRI brain which I personally reviewed, no acute infarct seen. There were scattered white matter  changes seen in the bilateral subcortical regions, as well as in the anterior left temporal lobe, felt to be most consistent with small vessel disease. 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 She was back to her normal self until 05/04/14 when she got out of bed and fell with no loss of consciousness. She stood up and got ready for work, but her husband felt that she seemed confused and drove her to work. 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 He stated she was making sense and denied any gibberish speech, but felt that "something was off." He brought her to her PCP's office and were instructed to go to the ER, where she was again evaluated by Neurology, repeat MRI brain without contrast was unchanged, with again note of [id number] from the left supraclinoid ICA suggestive of tiny aneurysm. She again returned to baseline within a few hours. She denied any focal numbness/tingling/weakness during these episodes. Since then, she has had a constant annoying 5 to 6 over 10 headache over the left hemisphere, with stabbing pain lasting all day. She also reports pain inside her left ear, no discharge. She takes up to 4 Ibuprofen daily which helps a little. There is no associated nausea, vomiting, photo/phonophobia. She denies any prior history of headaches, no family history of headaches.  She has been on a high dose of Elavil '300mg'$  qhs for at least 8 years. She also takes low dose gabapentin '300mg'$  qhs and Norco for the  back pain. She was recently started on Lisinopril for hypertension. Her husband checks her BP regularly, and reports doing orthostatic vital signs with occasional 15-point difference in her BP with position.  PAST MEDICAL HISTORY: Past Medical History:  Diagnosis Date  . GERD (gastroesophageal reflux disease)    WOULD USE OTC MEDICATION IF NEEDED  . Hypothyroidism   .  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 Rosacea   . Tachycardia    PT STATES HER HEART RATE USUALLY ABOVE 100    MEDICATIONS: Current Outpatient Prescriptions on File Prior to Visit  Medication Sig Dispense Refill  . amitriptyline (ELAVIL) 100 MG tablet TAKE 3 TABLETS BY MOUTH EVERY DAY 270 tablet 1  . Ascorbic Acid (VITAMIN C WITH ROSE HIPS) 1000 MG tablet Take 1,000 mg by mouth daily.    . Biotin 5000 MCG TABS Take 5,000 mcg by mouth daily.     . DULoxetine (CYMBALTA) 20 MG capsule TAKE 1 CAPSULE BY MOUTH DAILY. 90 capsule 1  . DULoxetine (CYMBALTA) 30 MG capsule Take 1 capsule (30 mg total) by mouth daily. 30 capsule 3  . gabapentin (NEURONTIN) 300 MG capsule TAKE ONE CAPSULE IN THE DAYTIME AND 2 CAPSULE AT BEDTIME 270 capsule 0  . levothyroxine (SYNTHROID) 75 MCG tablet TAKE 1 TABLET BY MOUTH DAILY BEFORE BREAKFAST. 90 tablet 3  . meloxicam (MOBIC) 15 MG tablet TAKE 1 TABLET (15 MG TOTAL) BY MOUTH DAILY. AS NEEDED FOR PAIN 90 tablet 0  . Multiple Vitamins-Minerals (MULTIVITAMIN WITH MINERALS) tablet Take 1 tablet by mouth daily.    . polyethylene glycol (MIRALAX / GLYCOLAX) packet Take 17 g by mouth daily as needed (for constipation).     . Suvorexant (BELSOMRA) 10 MG TABS Take 10 tablets by mouth at bedtime. 10 tablet 0   No current facility-administered medications on file prior to visit.     ALLERGIES: No Known Allergies  FAMILY HISTORY: Family History  Problem Relation Age of Onset  . Heart disease    . Pancreatic cancer Brother     SOCIAL HISTORY: Social History   Social History  . Marital status: Married    Spouse name: N/A  . Number of children: N/A  . Years of education: N/A   Occupational History  . Not on file.   Social History Main Topics  . Smoking status: Current Every Day Smoker    Packs/day: 1.00    Types: Cigarettes  . Smokeless tobacco: Never Used     Comment: back to tobacco 10 per day   . Alcohol use 0.0 oz/week  . Drug use: No    . Sexual activity: Not on file   Other Topics Concern  . 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    No health insurance self employed  Now on medicare    2 dogs     REVIEW OF SYSTEMS: Constitutional: No fevers, chills, or sweats, no generalized fatigue, change in appetite Eyes: No visual changes, double vision, eye pain Ear, nose and throat: No hearing loss, ear pain, nasal congestion, sore throat Cardiovascular: No chest pain, palpitations Respiratory:  No shortness of breath at rest or with exertion, wheezes GastrointestinaI: No nausea, vomiting, diarrhea, abdominal pain, fecal incontinence Genitourinary:  No dysuria, urinary retention or frequency Musculoskeletal:  No neck pain, +back pain Integumentary: No rash, pruritus, skin lesions Neurological: as above Psychiatric: No depression, insomnia, anxiety Endocrine: No palpitations, fatigue, diaphoresis, mood swings, change in appetite, change in weight, increased thirst Hematologic/Lymphatic:  No anemia, purpura, petechiae. Allergic/Immunologic: no itchy/runny eyes, nasal congestion, recent allergic reactions, rashes  PHYSICAL EXAM: Vitals:   05/05/16 1051  BP: 126/68  Pulse: 82  Temp: 98.4 F (36.9 C)   General: No acute distress Head:  Normocephalic, atraumatic Neck: supple, no paraspinal tenderness, full range of motion Heart:  Regular rate and rhythm Lungs:  Clear to auscultation bilaterally Back: No paraspinal tenderness Skin/Extremities: No rash, no edema Neurological Exam: alert and oriented to person, place, and time. No aphasia or dysarthria. Fund of knowledge is appropriate.  Recent and remote memory are intact.  Attention and concentration are normal.    Able to name objects and repeat phrases. Cranial nerves: Pupils equal, round, reactive to light.  Fundoscopic  exam unremarkable, no papilledema. Extraocular movements intact with no nystagmus. Visual fields full. Facial sensation intact. No facial asymmetry. Tongue, uvula, palate midline.  Motor: Bulk and tone normal, muscle strength 5/5 throughout with no pronator drift.  Sensation intact to all modalities on both UE, decreased vibration to ankles bilaterally, reports increase in pin below ankles bilaterally. Intact JPS.  No extinction to double simultaneous stimulation.  Deep tendon reflexes brisk +2 throughout, negative Hoffman sign, no ankle clonus, toes downgoing.  Finger to nose testing intact.  Gait narrow-based and steady, difficulty with tandem walk. Romberg negative.  IMPRESSION: This is a 70 yo RH woman with a history of hypothyroidism, chronic back pain, who had 2 episodes of confusion, one in the setting of medication ingestion, who initially presented with new onset constant left-sided headaches. The headaches had resolved after her left ear was cleaned out, however she returns more than a year later reporting 3 headaches a week. 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 She has had 20 falls in the past year, exam shows mild neuropathy and brisk reflexes. MRI cervical spine will be ordered to assess for spinal stenosis.She will be referred to PT for balance therapy. We discussed neurosurgery referral if there is change in the aneurysm size, or if there is significant spinal stenosis. She knows to call our office for any change in symptoms and will follow-up in 6 months. She will discuss rib pain with her PCP.  Thank you for allowing me to participate in her care.  Please do not hesitate to call for any questions or concerns.  The duration of this appointment visit was 25 minutes of face-to-face time with the patient.  Greater than 50% of this time was spent in counseling, explanation of diagnosis, planning of further management, and  coordination of care.   Ellouise Newer, M.D.   CC: Dr. Regis Bill

## 2016-05-05 NOTE — Patient Instructions (Signed)
1. Schedule CTA head with contrast to evaluate aneurysm 2. Schedule MRI cervical spine without contrast  3. Refer to Physical Therapy for balance therapy 4. Follow-up in 6 months, call for any changes

## 2016-05-13 ENCOUNTER — Ambulatory Visit
Admission: RE | Admit: 2016-05-13 | Discharge: 2016-05-13 | Disposition: A | Discharge status: Home / Self Care | Payer: Medicare Other | Source: Ambulatory Visit | Attending: Neurology | Admitting: Neurology

## 2016-05-13 ENCOUNTER — Other Ambulatory Visit: Payer: Self-pay

## 2016-05-13 ENCOUNTER — Telehealth: Payer: Self-pay | Admitting: Neurology

## 2016-05-13 DIAGNOSIS — R296 Repeated falls: Secondary | ICD-10-CM

## 2016-05-13 DIAGNOSIS — R292 Abnormal reflex: Secondary | ICD-10-CM

## 2016-05-13 DIAGNOSIS — I671 Cerebral aneurysm, nonruptured: Secondary | ICD-10-CM

## 2016-05-13 DIAGNOSIS — M50221 Other cervical disc displacement at C4-C5 level: Secondary | ICD-10-CM | POA: Diagnosis not present

## 2016-05-13 DIAGNOSIS — I729 Aneurysm of unspecified site: Secondary | ICD-10-CM

## 2016-05-13 MED ORDER — IOPAMIDOL (ISOVUE-370) INJECTION 76%
80.0000 mL | Freq: Once | INTRAVENOUS | Status: AC | PRN
  Administered 2016-05-13: 80 mL via INTRAVENOUS

## 2016-05-13 NOTE — Telephone Encounter (Signed)
Called home and cell numbers listed to discuss CTA results. Left VM for patient to call back.

## 2016-05-13 NOTE — Telephone Encounter (Signed)
Discussed cervical spine MRI and CTA head with patient. No significant stenosis seen, there are disc bulges and potential nerve impingement at C4. Discussed CTA results showing the aneurysms previously not reported, unchanged from 2015. Discussed recommendation to see Neurointerventionalist Dr. Estanislado Pandy.   [place], can you pls send referral to Dr. Estanislado Pandy, dx multiple cerebral aneurysms, and let patient know his office number. Thanks

## 2016-05-14 ENCOUNTER — Encounter: Payer: Self-pay | Admitting: Neurology

## 2016-05-14 NOTE — Telephone Encounter (Signed)
Contacted pt with Dr. Arlean Hopping name and number. She wants [place] to contact her after we have spoke with his office.

## 2016-05-14 NOTE — Telephone Encounter (Signed)
Someone just called and states that we were calling the wrong number so may want to try the home number. The number we were calling is no longer in the system.

## 2016-05-18 ENCOUNTER — Telehealth: Payer: Self-pay | Admitting: Neurology

## 2016-05-18 NOTE — Progress Notes (Signed)
Pre visit review using our clinic review tool, if applicable. No additional management support is needed unless otherwise documented below in the visit note.  Chief Complaint  Patient presents with  . Follow-up    HPI: [name] 70 y.o.  comes in today for follow-up with her husband. A number of issues.  Since the last time she was seen by me during when I was out of the office she developed right side pain upper abdominal area. Describes it like labor pains. But it is there most of the time not associated with vomiting some radiation to the back no other associated symptoms. Does not seem to be related to eating.  She has persistent right arm pain and neck pain that has been sore and hard to sleep on for a while.  It has been some concern because of the family history. Brother died of pancreatic cancer. She has no associated symptoms with eating vomiting loss or urination or hematuria.  She saw Dr. 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 Also aneurysms that were multiple son getting bigger. She was referred to interventional radiology but hasn't heard anything yet. She was also referred to physical therapy because of recurrent falling felt from neuropathy or something similar.  Her blood pressure is been in the normal range at home.  Left leg has been problematic saw Dr. Nelva Bush a while back only comfortable position is when she is squatting for work. This helps her back.  She never took the new sleep medicine. ROS: See pertinent positives and negatives per HPI.  Past Medical History:  Diagnosis Date  . GERD (gastroesophageal reflux disease)    WOULD USE OTC MEDICATION IF NEEDED  . Hypothyroidism   . 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 Rosacea   . Tachycardia    PT STATES HER HEART RATE USUALLY ABOVE 100    Family History  Problem Relation Age of Onset  . Pancreatic  cancer Brother   . Heart disease      Social History   Social History  . Marital status: Married    Spouse name: N/A  . Number of children: N/A  . Years of education: N/A   Social History Main Topics  . Smoking status: Current Every Day Smoker    Packs/day: 1.00    Types: Cigarettes  . Smokeless tobacco: Never Used     Comment: back to tobacco 10 per day   . Alcohol use 0.0 oz/week  . Drug use: No  . Sexual activity: Not Asked   Other Topics Concern  . 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    No health insurance self employed  Now on medicare    2 dogs     Outpatient Medications Prior to Visit  Medication Sig Dispense Refill  . amitriptyline (ELAVIL) 100 MG tablet TAKE 3 TABLETS BY MOUTH EVERY DAY 270 tablet 1  . Ascorbic Acid (VITAMIN C WITH ROSE HIPS) 1000 MG tablet Take 1,000 mg by mouth daily.    . Biotin 5000 MCG TABS Take 5,000 mcg by mouth daily.     . DULoxetine (CYMBALTA) 30 MG capsule Take 1 capsule (30 mg total)  by mouth daily. 30 capsule 3  . gabapentin (NEURONTIN) 300 MG capsule TAKE ONE CAPSULE IN THE DAYTIME AND 2 CAPSULE AT BEDTIME 270 capsule 0  . levothyroxine (SYNTHROID) 75 MCG tablet TAKE 1 TABLET BY MOUTH DAILY BEFORE BREAKFAST. 90 tablet 3  . meloxicam (MOBIC) 15 MG tablet TAKE 1 TABLET (15 MG TOTAL) BY MOUTH DAILY. AS NEEDED FOR PAIN 90 tablet 0  . Multiple Vitamins-Minerals (MULTIVITAMIN WITH MINERALS) tablet Take 1 tablet by mouth daily.    . polyethylene glycol (MIRALAX / GLYCOLAX) packet Take 17 g by mouth daily as needed (for constipation).     . DULoxetine (CYMBALTA) 20 MG capsule TAKE 1 CAPSULE BY MOUTH DAILY. 90 capsule 1  . Suvorexant (BELSOMRA) 10 MG TABS Take 10 tablets by mouth at bedtime. 10 tablet 0   No facility-administered medications prior to visit.      EXAM:  BP (!) 150/92 (BP  Location: Right Arm, Patient Position: Sitting, Cuff Size: Normal)   Temp 98.1 F (36.7 C) (Oral)   Ht '5\' 2"'$  (1.575 m)   Wt 104 lb (47.2 kg)   BMI 19.02 kg/m   Body mass index is 19.02 kg/m.  GENERAL: vitals reviewed and listed above, alert, oriented, appears well hydrated and in no acute distress   HEENT: atraumatic, conjunctiva  clear, no obvious abnormalities on inspection of external nose and ears OP : no lesion edema or exudate  NECK: no obvious masses on inspection palpation  LUNGS: clear to auscultation bilaterally, no wheezes, rales or rhonchi, good air movement Chest wall tenderness crepitus or deformity. CV: HRRR, no clubbing cyanosis or  peripheral edema nl cap refill  Abdomen: No organomegaly bowel sounds are present there is tenderness in the right upper right mid to lower abdomen but no rebound or guarding and negative psoas sign. MS: moves all extremities PSYCH: pleasant and cooperative, looks tired and uncomfortable but nontoxic pleasant. Lab Results  Component Value Date   WBC 7.5 05/19/2016   HGB 11.0 (L) 05/19/2016   HCT 32.4 (L) 05/19/2016   PLT 342.0 05/19/2016   GLUCOSE 75 05/19/2016   CHOL 162 08/13/2015   TRIG 102.0 08/13/2015   HDL 58.90 08/13/2015   LDLDIRECT 132.5 06/23/2010   LDLCALC 82 08/13/2015   ALT 6 05/19/2016   AST 13 05/19/2016   NA 135 05/19/2016   K 3.8 05/19/2016   CL 99 05/19/2016   CREATININE 0.79 05/19/2016   BUN 8 05/19/2016   CO2 31 05/19/2016   TSH 1.63 02/13/2016   INR 1.04 04/30/2014   HGBA1C 5.5 06/18/2008   Wt Readings from Last 3 Encounters:  05/19/16 104 lb (47.2 kg)  05/05/16 104 lb 6 oz (47.3 kg)  02/17/16 105 lb 8 oz (47.9 kg)   BP Readings from Last 3 Encounters:  05/19/16 (!) 150/92  05/05/16 126/68  02/17/16 116/72    ASSESSMENT AND PLAN:  Discussed the following assessment and plan:  RUQ abdominal pain - Seems more like abdominal then rib although I suppose it could be abdominal wall needs further  evaluation. - Plan: Basic metabolic panel, CBC with Differential/Platelet, Hepatic function panel, POCT Urinalysis, Dipstick, Sedimentation rate, POCT Urinalysis Dipstick (Automated), Culture, Urine, CT ABDOMEN PELVIS W CONTRAST  Need for prophylactic vaccination and inoculation against influenza - Plan: Flu vaccine HIGH DOSE PF (Fluzone High dose)  Medication management  Cervical spine disease - poss cause of right arm pain  consdier seeing ns consult about this   Hx of falling - felt to be  balance and neuropathy ? legs   Brain aneurysm - see cta  reports .   Right lower quadrant abdominal pain - Plan: CT ABDOMEN PELVIS W CONTRAST ua shows 1+ leuk do ur cx   Check for uti and then imaging   Total visit 40 mins > 50% spent counseling and coordinating care as indicated in above note and in instructions to patient .   -Patient advised to return or notify health care team  if symptoms worsen ,persist or new concerns arise.  Patient Instructions  Will notify you  of labs when available.  And plan   Imaging study of abdomen gall bladder . Will send  Message to  Dr Delice Lesch about referrals .  As discussed .   Sounds .  Like the right arm could be caused  From  arthritis int the spine  Pinching  Nerve .     And then  Plan follow up.  I agree with  PT for balance . ``    Standley Brooking. Rebakah Cokley M.D.  IMPRESSION: This is a 70 yo RH woman with a history of hypothyroidism, chronic back pain, who had 2 episodes of confusion, one in the setting of medication ingestion, who initially presented with new onset constant left-sided headaches. The headaches had resolved after her left ear was cleaned out, however she returns more than a year later reporting 3 headaches a week. 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 She has had 20 falls in the past year, exam shows mild neuropathy and brisk reflexes. MRI cervical spine will be ordered to assess for  spinal stenosis.She will be referred to PT for balance therapy. We discussed neurosurgery referral if there is change in the aneurysm size, or if there is significant spinal stenosis. She knows to call our office for any change in symptoms and will follow-up in 6 months. She will discuss rib pain with her PCP.  Thank you for allowing me to participate in her care.  Please do not hesitate to call for any questions or concerns.  The duration of this appointment visit was 25 minutes of face-to-face time with the patient.  Greater than 50% of this time was spent in counseling, explanation of diagnosis, planning of further management, and coordination of care.   Ellouise Newer, M.D.   CC: Dr. Regis Bill          Patient Instructions by Cameron Sprang, MD at 05/05/2016 11:00 AM   Author: Cameron Sprang, MD Author Type: Physician Filed: 05/05/2016 11:18 AM  Note Status: Signed Cosign: Cosign Not Required Encounter Date: 05/05/2016  Editor: Cameron Sprang, MD (Physician)    1. Schedule CTA head with contrast to evaluate aneurysm 2. Schedule MRI cervical spine without contrast  3. Refer to Physical Therapy for balance therapy 4. Follow-up in 6 months, call for any changes    Instructions      Return in about 6 months (around 11/02/2016).  1. Schedule CTA head with contrast to evaluate aneurysm 2. Schedule MRI cervical spine without contrast  3. Refer to Physical Therapy for balance therapy 4. Follow-up in 6 months, call for any changes     After Visit Summary (Printed 05/05/2016)  Communications

## 2016-05-18 NOTE — Telephone Encounter (Signed)
Notified pt I'm am still trying to get in touch with Dr. Arlean Hopping office. Will contact pt as soon as I hear back.

## 2016-05-18 NOTE — Telephone Encounter (Signed)
PT called in regards to a referral/Dawn CB# [phone]

## 2016-05-19 ENCOUNTER — Ambulatory Visit (INDEPENDENT_AMBULATORY_CARE_PROVIDER_SITE_OTHER): Payer: Medicare Other | Admitting: Internal Medicine

## 2016-05-19 ENCOUNTER — Encounter: Payer: Self-pay | Admitting: Internal Medicine

## 2016-05-19 VITALS — BP 150/92 | Temp 98.1°F | Ht 62.0 in | Wt 104.0 lb

## 2016-05-19 DIAGNOSIS — Z23 Encounter for immunization: Secondary | ICD-10-CM

## 2016-05-19 DIAGNOSIS — Z79899 Other long term (current) drug therapy: Secondary | ICD-10-CM

## 2016-05-19 DIAGNOSIS — I671 Cerebral aneurysm, nonruptured: Secondary | ICD-10-CM

## 2016-05-19 DIAGNOSIS — M438X2 Other specified deforming dorsopathies, cervical region: Secondary | ICD-10-CM

## 2016-05-19 DIAGNOSIS — Z9181 History of falling: Secondary | ICD-10-CM

## 2016-05-19 DIAGNOSIS — R1011 Right upper quadrant pain: Secondary | ICD-10-CM | POA: Diagnosis not present

## 2016-05-19 DIAGNOSIS — M489 Spondylopathy, unspecified: Secondary | ICD-10-CM

## 2016-05-19 DIAGNOSIS — R1031 Right lower quadrant pain: Secondary | ICD-10-CM

## 2016-05-19 LAB — CBC WITH DIFFERENTIAL/PLATELET
BASOS PCT: 0.5 % (ref 0.0–3.0)
Basophils Absolute: 0 10*3/uL (ref 0.0–0.1)
EOS ABS: 0.4 10*3/uL (ref 0.0–0.7)
Eosinophils Relative: 4.9 % (ref 0.0–5.0)
HEMATOCRIT: 32.4 % — AB (ref 36.0–46.0)
Hemoglobin: 11 g/dL — ABNORMAL LOW (ref 12.0–15.0)
LYMPHS ABS: 1.6 10*3/uL (ref 0.7–4.0)
LYMPHS PCT: 21.1 % (ref 12.0–46.0)
MCHC: 33.8 g/dL (ref 30.0–36.0)
MCV: 94.9 fl (ref 78.0–100.0)
MONOS PCT: 8.4 % (ref 3.0–12.0)
Monocytes Absolute: 0.6 10*3/uL (ref 0.1–1.0)
NEUTROS ABS: 4.9 10*3/uL (ref 1.4–7.7)
Neutrophils Relative %: 65.1 % (ref 43.0–77.0)
PLATELETS: 342 10*3/uL (ref 150.0–400.0)
RBC: 3.41 Mil/uL — ABNORMAL LOW (ref 3.87–5.11)
RDW: 13.8 % (ref 11.5–15.5)
WBC: 7.5 10*3/uL (ref 4.0–10.5)

## 2016-05-19 LAB — BASIC METABOLIC PANEL
BUN: 8 mg/dL (ref 6–23)
CHLORIDE: 99 meq/L (ref 96–112)
CO2: 31 meq/L (ref 19–32)
CREATININE: 0.79 mg/dL (ref 0.40–1.20)
Calcium: 8.5 mg/dL (ref 8.4–10.5)
GFR: 76.74 mL/min (ref >60.00)
GLUCOSE: 75 mg/dL (ref 70–99)
Potassium: 3.8 mEq/L (ref 3.5–5.1)
Sodium: 135 mEq/L (ref 135–145)

## 2016-05-19 LAB — HEPATIC FUNCTION PANEL
ALT: 6 U/L (ref 0–35)
AST: 13 U/L (ref 0–37)
Albumin: 3.1 g/dL — ABNORMAL LOW (ref 3.5–5.2)
Alkaline Phosphatase: 100 U/L (ref 39–117)
Bilirubin, Direct: 0.1 mg/dL (ref 0.0–0.3)
TOTAL PROTEIN: 6 g/dL (ref 6.0–8.3)
Total Bilirubin: 0.2 mg/dL (ref 0.2–1.2)

## 2016-05-19 LAB — POC URINALSYSI DIPSTICK (AUTOMATED)
Bilirubin, UA: NEGATIVE
Blood, UA: NEGATIVE
GLUCOSE UA: NEGATIVE
KETONES UA: NEGATIVE
Nitrite, UA: NEGATIVE
Protein, UA: NEGATIVE
Urobilinogen, UA: 0.2
pH, UA: 7

## 2016-05-19 LAB — SEDIMENTATION RATE: Sed Rate: 40 mm/hr — ABNORMAL HIGH (ref 0–30)

## 2016-05-19 NOTE — Telephone Encounter (Signed)
Updated in record.

## 2016-05-19 NOTE — Patient Instructions (Signed)
Will notify you  of labs when available.  And plan   Imaging study of abdomen gall bladder . Will send  Message to  Dr Delice Lesch about referrals .  As discussed .   Sounds .  Like the right arm could be caused  From  arthritis int the spine  Pinching  Nerve .     And then  Plan follow up.  I agree with  PT for balance . ``

## 2016-05-20 DIAGNOSIS — R1031 Right lower quadrant pain: Secondary | ICD-10-CM | POA: Insufficient documentation

## 2016-05-20 DIAGNOSIS — M489 Spondylopathy, unspecified: Secondary | ICD-10-CM | POA: Insufficient documentation

## 2016-05-20 NOTE — Progress Notes (Signed)
Referral sent. Pt notified.

## 2016-05-21 ENCOUNTER — Encounter: Payer: Self-pay | Admitting: *Deleted

## 2016-05-21 NOTE — Telephone Encounter (Signed)
FYI

## 2016-05-21 NOTE — Telephone Encounter (Signed)
Called pt back with Dr. Arlean Hopping contact info:   [facility] [address] [place],  [address] Phone: [phone] Fax: [phone]

## 2016-05-21 NOTE — Telephone Encounter (Signed)
Advised that Aldona Bar is still attempting to reach Dr. Arlean Hopping office and that she will contact pt as soon as possible.

## 2016-05-21 NOTE — Telephone Encounter (Signed)
PT called and said Dr Delice Lesch was giving her a referral for a surgeon/Dawn CB# [phone]

## 2016-05-22 ENCOUNTER — Other Ambulatory Visit: Payer: Self-pay | Admitting: Internal Medicine

## 2016-05-22 LAB — URINE CULTURE

## 2016-05-22 MED ORDER — SULFAMETHOXAZOLE-TRIMETHOPRIM 800-160 MG PO TABS: 1.0000 | ORAL_TABLET | Freq: Two times a day (BID) | ORAL | 0 refills | Status: DC

## 2016-05-22 NOTE — Progress Notes (Signed)
See result note e coli r to cipro  And   Has right abd pain ruq pain

## 2016-05-22 NOTE — Telephone Encounter (Signed)
LVM with pt that her new referral was sent to [facility] with their contact information.

## 2016-05-25 ENCOUNTER — Ambulatory Visit (INDEPENDENT_AMBULATORY_CARE_PROVIDER_SITE_OTHER): Payer: Medicare Other | Admitting: Internal Medicine

## 2016-05-25 ENCOUNTER — Telehealth: Payer: Self-pay | Admitting: Internal Medicine

## 2016-05-25 ENCOUNTER — Encounter: Payer: Self-pay | Admitting: Internal Medicine

## 2016-05-25 VITALS — BP 110/60 | HR 50 | Resp 16 | Ht 62.0 in | Wt 102.8 lb

## 2016-05-25 DIAGNOSIS — I671 Cerebral aneurysm, nonruptured: Secondary | ICD-10-CM | POA: Diagnosis not present

## 2016-05-25 DIAGNOSIS — N390 Urinary tract infection, site not specified: Secondary | ICD-10-CM

## 2016-05-25 DIAGNOSIS — D649 Anemia, unspecified: Secondary | ICD-10-CM

## 2016-05-25 DIAGNOSIS — R1011 Right upper quadrant pain: Secondary | ICD-10-CM

## 2016-05-25 NOTE — Progress Notes (Signed)
Chief Complaint  Patient presents with  . Abdominal Pain    wants to know if it is e. coli and contagious to her grand children,  . Anemia    wants to address with dr    HPI: [name] 70 y.o.  Here with husband  Sent in by team health although they were not really asking fro appt  For  Concern aobut  and fu.The messages on my chart and began the Septra Bactrim over the weekend. But there is an concerns about the meaning of Escherichia coli in the urine. Husband had read about this and was worried about communicability to grandchild triplets. She has no diarrhea states that she is feeling better and her pain is improved although still there. She saw the vascular specialist radiologist. The next step was to consider doing an angiogram to decide if aneurysms are to be intervened with which method at this time. She was also noted to have a mild anemia and her last labs which I'm aware she has no active bleeding.   ROS: See pertinent positives and negatives per HPI.  Past Medical History:  Diagnosis Date  . GERD (gastroesophageal reflux disease)    WOULD USE OTC MEDICATION IF NEEDED  . Hypothyroidism   . 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 Rosacea   . Tachycardia    PT STATES HER HEART RATE USUALLY ABOVE 100    Family History  Problem Relation Age of Onset  . Pancreatic cancer Brother   . Heart disease      Social History   Social History  . Marital status: Married    Spouse name: N/A  . Number of children: N/A  . Years of education: N/A   Social History Main Topics  . Smoking status: Current Every Day Smoker    Packs/day: 1.00    Types: Cigarettes  . Smokeless tobacco: Never Used     Comment: back to tobacco 10 per day   . Alcohol use 0.0 oz/week  . Drug use: No  . Sexual activity: Not on file   Other Topics Concern  . 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    No health insurance self employed  Now on medicare    2 dogs     Outpatient Medications Prior to Visit  Medication Sig Dispense Refill  . amitriptyline (ELAVIL) 100 MG tablet TAKE 3 TABLETS BY MOUTH EVERY DAY 270 tablet 1  . Ascorbic Acid (VITAMIN C WITH ROSE HIPS) 1000 MG tablet Take 1,000 mg by mouth daily.    . Biotin 5000 MCG TABS Take 5,000 mcg by mouth daily.     . DULoxetine (CYMBALTA) 30 MG capsule Take 1 capsule (30 mg total) by mouth daily. 30 capsule 3  . gabapentin (NEURONTIN) 300 MG capsule TAKE ONE CAPSULE IN THE DAYTIME AND 2 CAPSULE AT BEDTIME 270 capsule 0  . levothyroxine (SYNTHROID) 75 MCG tablet TAKE 1 TABLET BY MOUTH DAILY BEFORE BREAKFAST. 90 tablet 3  . meloxicam (MOBIC) 15 MG tablet TAKE 1 TABLET (15 MG TOTAL) BY MOUTH DAILY. AS NEEDED FOR PAIN 90 tablet 0  . Multiple Vitamins-Minerals (MULTIVITAMIN WITH MINERALS) tablet Take 1 tablet by mouth daily.    . polyethylene glycol (MIRALAX / GLYCOLAX) packet  Take 17 g by mouth daily as needed (for constipation).     Marland Kitchen sulfamethoxazole-trimethoprim (BACTRIM DS,SEPTRA DS) 800-160 MG tablet Take 1 tablet by mouth 2 (two) times daily. 20 tablet 0   No facility-administered medications prior to visit.      EXAM:  BP 110/60 (BP Location: Left Arm, Patient Position: Sitting, Cuff Size: Normal)   Pulse (!) 50   Resp 16   Ht '5\' 2"'$  (1.575 m)   Wt 102 lb 12.8 oz (46.6 kg)   SpO2 96%   BMI 18.80 kg/m   Body mass index is 18.8 kg/m.  GENERAL: vitals reviewed and listed above, alert, oriented, appears well hydrated and in no acute distressAppears more comfortable. HEENT: atraumatic, conjunctiva  clear, no obvious abnormalities on inspection of external nose and ears NECK: no obvious masses on inspection palpation  LUNGS: clear to auscultation bilaterally, no wheezes, rales or rhonchi, good air movement CV: HRRR, no clubbing cyanosis or  peripheral edema  nl cap refill  Abdomen:  Sof,t normal bowel sounds without hepatosplenomegaly, no guarding rebound or masses much less tender today.  MS: moves all extremities without noticeable focal  abnormality PSYCH: pleasant and cooperative,  Lab Results  Component Value Date   WBC 7.5 05/19/2016   HGB 11.0 (L) 05/19/2016   HCT 32.4 (L) 05/19/2016   PLT 342.0 05/19/2016   GLUCOSE 75 05/19/2016   CHOL 162 08/13/2015   TRIG 102.0 08/13/2015   HDL 58.90 08/13/2015   LDLDIRECT 132.5 06/23/2010   LDLCALC 82 08/13/2015   ALT 6 05/19/2016   AST 13 05/19/2016   NA 135 05/19/2016   K 3.8 05/19/2016   CL 99 05/19/2016   CREATININE 0.79 05/19/2016   BUN 8 05/19/2016   CO2 31 05/19/2016   TSH 1.63 02/13/2016   INR 1.04 04/30/2014   HGBA1C 5.5 06/18/2008   Lab review with husband and patient  ASSESSMENT AND PLAN:  Discussed the following assessment and plan:  RUQ abdominal pain  Urinary tract infection without hematuria, site unspecified  Anemia, unspecified type uti abd pain  And   New borderline anemia     rx uti get scan   If needed we can plan we can do repeat cbc with iron studies and b12  After better from this illness and scan revview  May need gi referral Reassured that this Escherichia coli not the same that is transmittable to grandchildren. There is no diarrhea associated. I would like to go forward with a CT scan because of her unusual presentation anemia and I don't think that all of her symptoms are just from UTI. Expectant management. We'll plan follow-up after all data in. I'm glad she is feeling better.-Patient advised to return or notify health care team  if symptoms worsen ,persist or new concerns arise.  Patient Instructions  Plan fu after  Evaluation and better at end of medication     Standley Brooking. Panosh M.D.

## 2016-05-25 NOTE — Telephone Encounter (Signed)
[facility] - Client [place] [facility]  Patient Name: [name]  DOB: 1945-11-08    Initial Comment Caller states wife's urine cultures came back on MyChart dx with bacterial infection, e.coli. Has concerns about her being contagious. She has abd pain.   Nurse Assessment  Nurse: Wynetta Emery, RN, Baker Janus Date/Time Eilene Ghazi Time): 05/25/2016 9:52:50 AM  Confirm and document reason for call. If symptomatic, describe symptoms. You must click the next button to save text entered. ---[name] has abodominal pain; no diarrhea no blood in stool My Chart showed that she has ecoli in urine is the medication she is taking for stomach ailment going to be good enough to treat the ecoli urine.  Has the patient traveled out of the country within the last 30 days? ---No  Does the patient have any new or worsening symptoms? ---Yes  Will a triage be completed? ---Yes  Related visit to physician within the last 2 weeks? ---Yes  Does the PT have any chronic conditions? (i.e. diabetes, asthma, etc.) ---Unknown  Is this a behavioral health or substance abuse call? ---No     Guidelines    Guideline Title Affirmed Question Affirmed Notes  Abdominal Pain - Upper [1] MODERATE pain (e.g., interferes with normal activities) AND [2] comes and goes (cramps) AND [3] present > 24 hours (Exception: pain with Vomiting or Diarrhea - see that Guideline)    Final Disposition User   See Physician within [place], RN, Baker Janus    Comments  NOTE appointment made for 10/09-2015 200pm Shanon Ace talk about abd pain and test results for urine   Referrals  REFERRED TO PCP OFFICE   Disagree/Comply: Comply

## 2016-05-25 NOTE — Telephone Encounter (Signed)
Noted. Appointment with PCP today

## 2016-05-25 NOTE — Patient Instructions (Signed)
Plan fu after  Evaluation and better at end of medication

## 2016-05-28 ENCOUNTER — Other Ambulatory Visit (HOSPITAL_COMMUNITY): Payer: Self-pay | Admitting: Neurosurgery

## 2016-05-28 ENCOUNTER — Other Ambulatory Visit: Payer: Self-pay | Admitting: Neurosurgery

## 2016-05-28 DIAGNOSIS — I671 Cerebral aneurysm, nonruptured: Secondary | ICD-10-CM

## 2016-06-01 ENCOUNTER — Encounter: Payer: Self-pay | Admitting: Family Medicine

## 2016-06-01 ENCOUNTER — Other Ambulatory Visit: Payer: Self-pay | Admitting: Family Medicine

## 2016-06-01 ENCOUNTER — Encounter: Payer: Self-pay | Admitting: Internal Medicine

## 2016-06-01 ENCOUNTER — Other Ambulatory Visit: Payer: Self-pay | Admitting: General Surgery

## 2016-06-01 MED ORDER — POTASSIUM CHLORIDE CRYS ER 20 MEQ PO TBCR: 20.0000 meq | EXTENDED_RELEASE_TABLET | Freq: Every day | ORAL | 0 refills | Status: DC

## 2016-06-02 ENCOUNTER — Ambulatory Visit (HOSPITAL_COMMUNITY): Admission: RE | Admit: 2016-06-02 | Payer: Medicare Other | Source: Ambulatory Visit

## 2016-06-02 ENCOUNTER — Encounter: Payer: Self-pay | Admitting: Internal Medicine

## 2016-06-02 ENCOUNTER — Encounter: Payer: Self-pay | Admitting: Family Medicine

## 2016-06-02 ENCOUNTER — Telehealth (HOSPITAL_COMMUNITY): Payer: Self-pay | Admitting: Radiology

## 2016-06-02 NOTE — Telephone Encounter (Signed)
Ok to give note to say that her  ecoli UTI is not communicable.

## 2016-06-02 NOTE — Telephone Encounter (Signed)
Called Samantha, left message for her to call me back concerning this patient. Aldona Bar called back and I asked her what happened that this patient who was initially referred to Dr. Estanislado Pandy was soon also referred to Dr. Kathyrn Sheriff. She states that she called our office and left 2 messages but did not get a call back and that the patient decided they did not want to wait and wanted another referral. I informed Aldona Bar that our office had in fact called her back and left messages with her office. She states that she is new and is still learning. She also stated that she "thought she put in a referral but didn't know if she did it correctly." I told her that there was no referral in [facility] for [place] and she stated she thought she was doing it right. There is no referral in EPIC still at this point. When the patient requested another doctor, they sent a paper fax over the Dr. Cleotilde Neer office, which is how that office received an actual referral. I gave her our fax number and made sure that she had the correct phone number for our office for future referrals. She apologized for the confusion and said she was still very new with this office and learning. I think future referrals should go more smoothly now that she and I have actually spoken and she has our information. She states that she is still in the process of learning how to enter referrals in EPIC so I told her to feel free to fax over anytime and that would work equally fine as an EPIC referral. I also advised her that she could just leave a VM with a patient's MRN and that they were referring them and that I could obtain the necessary information from the patient's chart as could have been done with this patient but she left no patient name or info on her 2 messages. JM

## 2016-06-04 ENCOUNTER — Other Ambulatory Visit: Payer: Self-pay | Admitting: Radiology

## 2016-06-05 ENCOUNTER — Ambulatory Visit (HOSPITAL_COMMUNITY)
Admission: RE | Admit: 2016-06-05 | Discharge: 2016-06-05 | Disposition: A | Discharge status: Home / Self Care | Payer: Medicare Other | Source: Ambulatory Visit | Attending: Neurosurgery | Admitting: Neurosurgery

## 2016-06-05 ENCOUNTER — Other Ambulatory Visit (HOSPITAL_COMMUNITY): Payer: Self-pay | Admitting: Neurosurgery

## 2016-06-05 DIAGNOSIS — Z79899 Other long term (current) drug therapy: Secondary | ICD-10-CM | POA: Diagnosis not present

## 2016-06-05 DIAGNOSIS — E039 Hypothyroidism, unspecified: Secondary | ICD-10-CM | POA: Insufficient documentation

## 2016-06-05 DIAGNOSIS — F17210 Nicotine dependence, cigarettes, uncomplicated: Secondary | ICD-10-CM | POA: Insufficient documentation

## 2016-06-05 DIAGNOSIS — I671 Cerebral aneurysm, nonruptured: Secondary | ICD-10-CM | POA: Diagnosis not present

## 2016-06-05 HISTORY — PX: IR GENERIC HISTORICAL: IMG1180011

## 2016-06-05 LAB — BASIC METABOLIC PANEL
Anion gap: 6 (ref 5–15)
BUN: 7 mg/dL (ref 6–20)
CO2: 24 mmol/L (ref 22–32)
Calcium: 8.9 mg/dL (ref 8.9–10.3)
Chloride: 105 mmol/L (ref 101–111)
Creatinine, Ser: 0.89 mg/dL (ref 0.44–1.00)
GFR calc Af Amer: >60 mL/min (ref >60)
GFR calc non Af Amer: >60 mL/min (ref >60)
GLUCOSE: 99 mg/dL (ref 65–99)
POTASSIUM: 4 mmol/L (ref 3.5–5.1)
Sodium: 135 mmol/L (ref 135–145)

## 2016-06-05 LAB — CBC WITH DIFFERENTIAL/PLATELET
Basophils Absolute: 0 10*3/uL (ref 0.0–0.1)
Basophils Relative: 1 %
Eosinophils Absolute: 0.3 10*3/uL (ref 0.0–0.7)
Eosinophils Relative: 4 %
HEMATOCRIT: 33.3 % — AB (ref 36.0–46.0)
Hemoglobin: 11.4 g/dL — ABNORMAL LOW (ref 12.0–15.0)
LYMPHS ABS: 1.3 10*3/uL (ref 0.7–4.0)
LYMPHS PCT: 20 %
MCH: 31.9 pg (ref 26.0–34.0)
MCHC: 34.2 g/dL (ref 30.0–36.0)
MCV: 93.3 fL (ref 78.0–100.0)
MONO ABS: 0.5 10*3/uL (ref 0.1–1.0)
MONOS PCT: 8 %
NEUTROS ABS: 4.4 10*3/uL (ref 1.7–7.7)
Neutrophils Relative %: 67 %
Platelets: 380 10*3/uL (ref 150–400)
RBC: 3.57 MIL/uL — ABNORMAL LOW (ref 3.87–5.11)
RDW: 13.5 % (ref 11.5–15.5)
WBC: 6.5 10*3/uL (ref 4.0–10.5)

## 2016-06-05 LAB — PROTIME-INR
INR: 0.93
Prothrombin Time: 12.4 seconds (ref 11.4–15.2)

## 2016-06-05 LAB — APTT: aPTT: 28 seconds (ref 24–36)

## 2016-06-05 MED ORDER — IOPAMIDOL (ISOVUE-300) INJECTION 61%
INTRAVENOUS | Status: AC
  Administered 2016-06-05: 30 mL
  Filled 2016-06-05: qty 150

## 2016-06-05 MED ORDER — MIDAZOLAM HCL 2 MG/2ML IJ SOLN
INTRAMUSCULAR | Status: AC
  Filled 2016-06-05: qty 2

## 2016-06-05 MED ORDER — FENTANYL CITRATE (PF) 100 MCG/2ML IJ SOLN
INTRAMUSCULAR | Status: AC | PRN
  Administered 2016-06-05: 25 ug via INTRAVENOUS

## 2016-06-05 MED ORDER — LIDOCAINE HCL 1 % IJ SOLN
INTRAMUSCULAR | Status: AC
  Filled 2016-06-05: qty 20

## 2016-06-05 MED ORDER — FENTANYL CITRATE (PF) 100 MCG/2ML IJ SOLN
INTRAMUSCULAR | Status: AC
  Filled 2016-06-05: qty 2

## 2016-06-05 MED ORDER — MIDAZOLAM HCL 2 MG/2ML IJ SOLN
INTRAMUSCULAR | Status: AC | PRN
Start: 2016-06-05 — End: 2016-06-05
  Administered 2016-06-05: 1 mg via INTRAVENOUS

## 2016-06-05 MED ORDER — SODIUM CHLORIDE 0.9 % IV SOLN: INTRAVENOUS | Status: DC

## 2016-06-05 MED ORDER — IOPAMIDOL (ISOVUE-300) INJECTION 61%
INTRAVENOUS | Status: AC
  Administered 2016-06-05: 35 mL
  Filled 2016-06-05: qty 100

## 2016-06-05 MED ORDER — HEPARIN SODIUM (PORCINE) 1000 UNIT/ML IJ SOLN
INTRAMUSCULAR | Status: AC
  Filled 2016-06-05: qty 1

## 2016-06-05 MED ORDER — HEPARIN SODIUM (PORCINE) 1000 UNIT/ML IJ SOLN
INTRAMUSCULAR | Status: AC | PRN
  Administered 2016-06-05: 2000 [IU] via INTRAVENOUS

## 2016-06-05 MED ORDER — HYDROCODONE-ACETAMINOPHEN 5-325 MG PO TABS
1.0000 | ORAL_TABLET | ORAL | Status: DC | PRN
  Administered 2016-06-05: 2 via ORAL

## 2016-06-05 MED ORDER — LIDOCAINE HCL 1 % IJ SOLN
INTRAMUSCULAR | Status: AC | PRN
  Administered 2016-06-05: 10 mL

## 2016-06-05 MED ORDER — HYDROCODONE-ACETAMINOPHEN 5-325 MG PO TABS
ORAL_TABLET | ORAL | Status: AC
  Administered 2016-06-05: 2 via ORAL
  Filled 2016-06-05: qty 2

## 2016-06-05 NOTE — H&P (Signed)
CC:  Aneurysms  HPI: [name] is a 70 y.o. female with a history of previously seen intracranial aneurysms. Recent CT for HA has demonstrated slight enlargment of the right Pcom aneurysm. She therefore presents for further workup with diagnostic cerebral angiogram  PMH: Past Medical History:  Diagnosis Date  . GERD (gastroesophageal reflux disease)    WOULD USE OTC MEDICATION IF NEEDED  . Hypothyroidism   . 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 Rosacea   . Tachycardia    PT STATES HER HEART RATE USUALLY ABOVE 100    PSH: Past Surgical History:  Procedure Laterality Date  . BREAST BIOPSY     bilateral benign  . BUNIONECTOMY  2003   bilateral  . LUMBAR LAMINECTOMY/DECOMPRESSION MICRODISCECTOMY Left 03/17/2013   Procedure: LUMBAR LAMINECTOMY/DECOMPRESSION IF RECESS STENOSIS FORAMINOTOMIE OF L4 ROOT AND L5,MICRODISECTOMY  L4-5 ON LEFT;  Surgeon: Tobi Bastos, MD;  Location: [facility];  Service: Orthopedics;  Laterality: Left;  . TONSILLECTOMY  1953    SH: Social History  Substance Use Topics  . Smoking status: Current Every Day Smoker    Packs/day: 1.00    Types: Cigarettes  . Smokeless tobacco: Never Used     Comment: back to tobacco 10 per day   . Alcohol use 0.0 oz/week    MEDS: Prior to Admission medications   Medication Sig Start Date End Date Taking? Authorizing Provider  amitriptyline (ELAVIL) 100 MG tablet TAKE 3 TABLETS BY MOUTH EVERY DAY 04/08/16  Yes Burnis Medin, MD  Ascorbic Acid (VITAMIN C WITH ROSE HIPS) 1000 MG tablet Take 1,000 mg by mouth daily.   Yes Historical Provider, MD  Biotin 5000 MCG TABS Take 5,000 mcg by mouth daily.    Yes Historical Provider, MD  DULoxetine (CYMBALTA) 30 MG capsule Take 1 capsule (30 mg total) by mouth daily. 11/18/15  Yes Burnis Medin, MD  gabapentin (NEURONTIN) 300 MG capsule TAKE ONE CAPSULE IN THE DAYTIME AND 2 CAPSULE AT BEDTIME 03/18/16  Yes Burnis Medin, MD  levothyroxine (SYNTHROID) 75 MCG  tablet TAKE 1 TABLET BY MOUTH DAILY BEFORE BREAKFAST. 02/17/16  Yes Burnis Medin, MD  meloxicam (MOBIC) 15 MG tablet TAKE 1 TABLET (15 MG TOTAL) BY MOUTH DAILY. AS NEEDED FOR PAIN 03/18/16  Yes Burnis Medin, MD  Multiple Vitamins-Minerals (MULTIVITAMIN WITH MINERALS) tablet Take 1 tablet by mouth daily.   Yes Historical Provider, MD  polyethylene glycol (MIRALAX / GLYCOLAX) packet Take 17 g by mouth daily as needed (for constipation).    Yes Historical Provider, MD  potassium chloride SA (K-DUR,KLOR-CON) 20 MEQ tablet Take 1 tablet (20 mEq total) by mouth daily. 06/01/16  Yes Burnis Medin, MD  sulfamethoxazole-trimethoprim (BACTRIM DS,SEPTRA DS) 800-160 MG tablet Take 1 tablet by mouth 2 (two) times daily. 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 There is also a slightly smaller right posterior communicating  artery aneurysm which is apparently slightly enlarged in comparison at 2015. 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   IMPRESSION: - 70 year old woman with multiple intracranial aneurysms including 7 mm left Pcom, and slightly smaller right Pcom which may be enlarged over the last two years. 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 Given her age and the size of these aneurysms, I do think that treatment would be warranted.   PLAN: - Proceed with diagnostic cerebral angiogram  I have  reviewed the risks, benefits, and alternatives with the patient and her husband in the office. All questions were answered.

## 2016-06-05 NOTE — Sedation Documentation (Signed)
Patient is resting comfortably. 

## 2016-06-05 NOTE — Sedation Documentation (Signed)
Exoseal placed to R groin, IR tech will hold pressure also

## 2016-06-05 NOTE — Progress Notes (Signed)
Up and walked and tol well

## 2016-06-05 NOTE — Discharge Instructions (Signed)
Groin Site Care Refer to this sheet in the next few weeks. These instructions provide you with information about caring for yourself after your procedure. Your health care provider may also give you more specific instructions. Your treatment has been planned according to current medical practices, but problems sometimes occur. Call your health care provider if you have any problems or questions after your procedure. WHAT TO EXPECT AFTER THE PROCEDURE After your procedure, it is typical to have the following:  Bruising at the groin site that usually fades within 1-2 weeks.  Blood collecting in the tissue (hematoma) that may be painful to the touch. It should usually decrease in size and tenderness within 1-2 weeks. HOME CARE INSTRUCTIONS  Take medicines only as directed by your health care provider.  You may shower 24-48 hours after the procedure or as directed by your health care provider. Remove the bandage (dressing) and gently wash the site with plain soap and water. Pat the area dry with a clean towel. Do not rub the site, because this may cause bleeding.  Do not take baths, swim, or use a hot tub until your health care provider approves.  Check your insertion site every day for redness, swelling, or drainage.  Do not apply powder or lotion to the site.  Limit use of stairs to twice a day for the first 2-3 days or as directed by your health care provider.  Do not squat for the first 2-3 days or as directed by your health care provider.  Do not lift over 10 lb (4.5 kg) for 5 days after your procedure or as directed by your health care provider.  Ask your health care provider when it is okay to:  Return to work or school.  Resume usual physical activities or sports.  Resume sexual activity.  Do not drive home if you are discharged the same day as the procedure. Have someone else drive you.  You may drive 24 hours after the procedure unless otherwise instructed by your health  care provider.  Do not operate machinery or power tools for 24 hours after the procedure or as directed by your health care provider.  If your procedure was done as an outpatient procedure, which means that you went home the same day as your procedure, a responsible adult should be with you for the first 24 hours after you arrive home.  Keep all follow-up visits as directed by your health care provider. This is important. SEEK MEDICAL CARE IF:  You have a fever.  You have chills.  You have increased bleeding from the groin site. Hold pressure on the site. SEEK IMMEDIATE MEDICAL CARE IF:  You have unusual pain at the groin site.  You have redness, warmth, or swelling at the groin site.  You have drainage (other than a small amount of blood on the dressing) from the groin site.  The groin site is bleeding, and the bleeding does not stop after 30 minutes of holding steady pressure on the site.  Your leg or foot becomes pale, cool, tingly, or numb.   This information is not intended to replace advice given to you by your health care provider. Make sure you discuss any questions you have with your health care provider.   Document Released: 04/13/2014 Document Reviewed: 04/13/2014 Elsevier Interactive Patient Education [employer].

## 2016-06-05 NOTE — Sedation Documentation (Signed)
MD has spoke with pt and family about preocedure. Pts dtr is nurse. Family has no questions/concerns

## 2016-06-05 NOTE — Sedation Documentation (Signed)
Patient is resting comfortably.Tolerating procedure well.

## 2016-06-05 NOTE — Sedation Documentation (Signed)
IR tech holding pressure 

## 2016-06-05 NOTE — Sedation Documentation (Signed)
Groin pressure hold complete- dsg intact. Bedrest 3 hrs- until 3Pm

## 2016-06-15 DIAGNOSIS — I671 Cerebral aneurysm, nonruptured: Secondary | ICD-10-CM | POA: Diagnosis not present

## 2016-06-15 DIAGNOSIS — R030 Elevated blood-pressure reading, without diagnosis of hypertension: Secondary | ICD-10-CM | POA: Diagnosis not present

## 2016-06-16 ENCOUNTER — Other Ambulatory Visit (HOSPITAL_COMMUNITY): Payer: Self-pay | Admitting: Neurosurgery

## 2016-06-16 ENCOUNTER — Other Ambulatory Visit: Payer: Self-pay | Admitting: Neurosurgery

## 2016-06-16 DIAGNOSIS — I671 Cerebral aneurysm, nonruptured: Secondary | ICD-10-CM

## 2016-06-17 ENCOUNTER — Encounter (HOSPITAL_COMMUNITY): Payer: Self-pay | Admitting: Neurosurgery

## 2016-06-17 NOTE — Op Note (Signed)
DIAGNOSTIC CEREBRAL ANGIOGRAM    OPERATOR:   Dr. Consuella Lose, MD  HISTORY:   The patient is a 70 year old woman initially presenting with headache.  She had a previous CT angiogram which demonstrated bilateral internal carotid artery aneurysms.  Repeat CT angiogram a few years later demonstrated slight enlargement of the right-sided aneurysm, and a largely stable left-sided aneurysm.  She therefore presents for further workup with diagnostic cerebral angiogram.  APPROACH:   The technical aspects of the procedure as well as its potential risks and benefits were reviewed with the patient. These risks included but were not limited bleeding, infection, allergic reaction, damage to organs/vital structures, stroke, non-diagnostic procedure, and the catastrophic outcomes of heart attack, coma, and death. With an understanding of these risks, informed consent was obtained and witnessed.    The patient was placed in the supine position on the angiography table and the skin of right groin prepped in the usual sterile fashion. The procedure was performed under local anesthesia (1%-solution of bicarbonate-bufferred Lidoacaine) and conscious sedation with Versed and fentanyl monitored by the in-suite nurse.    A 5- French sheath was introduced in the right common femoral artery using Seldinger technique.  A fluorophase sequence was used to document the sheath position.    HEPARIN: 2000 Units total.   CONTRAST AGENT: 120cc, Omnipaque 300   FLUOROSCOPY TIME: 8.4 combined AP and lateral minutes    CATHETER(S) AND WIRE(S):    5-French Pigtail catheter 5-French JB-1 glidecatheter   0.035" glidewire    VESSELS CATHETERIZED:   Right common carotid   Left internal carotid   Right vertebral   Left vertebral   Right common femoral  VESSELS STUDIED:   Aortic arch Right common carotid, head Right vertebral Left internal carotid, head Left vertebral Right femoral  PROCEDURAL NARRATIVE:   A 5-Fr  pigtail catheter was advanced over a glidewire into the aortic arch and an aortogram was performed. The pigtail was removed by pin pull technique over the wire.   A 5-Fr JB-1 terumo glide catheter was advanced over a 0.035 glidewire into the aortic arch. The above vessels were then sequentially catheterized and cervical/cerebral angiograms taken. After review of images, the catheter was removed without incident.    INTERPRETATION:   Aortic arch:    Type IIII, with common origin of the left common carotid and brachiocephalic arteries. No significant ostial stenosis.   Right common carotid: head:   Injection reveals the presence of a widely patent ICA, M1, and A1 segments and their branches. There is a posterior communicating artery aneurysm projecting inferiorly, posteriorly, and laterally which measures 4.5 mm in height by 3.9 mm in width. The parenchymal and venous phases are normal. The venous sinuses are widely patent.    Left internal carotid: head:   Injection reveals the presence of a widely patent ICA, A1, and M1 segments and their branches.  There is an irregular appearing, Horseshoe shaped aneurysm  Arising at the origin of the left posterior communicating artery aneurysm.  Overall, the aneurysm has maximal dimensions of 5.8 mm in height, by  6.7 mm in width. The posterior communicating artery appears to arise at the base of the aneurysm. In addition, there is a small, approximately 3.4 mm aneurysm projecting medially just distal to the origin of the ophthalmic artery.  A third aneurysm is seen in the anterior genu of the cavernous carotid, which appears to be bilobed, measuring 4.5 mm in width x 2.9 mm in height. The parenchymal and venous  phases are normal. The venous sinuses are widely patent.    Right vertebral:   Injection reveals the presence of a widely patent vertebral artery. This leads to a widely patent basilar artery that terminates in bilateral P1. The basilar apex is normal.  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 The parenchymal and venous phases are normal. The venous sinuses are widely patent.    Left vertebral:    Normal vessel. No PICA aneurysm. See basilar description above.    Right femoral:    Normal vessel. No significant atherosclerotic disease. Arterial sheath in adequate position.   DISPOSITION:  Upon completion of the study, the femoral sheath was removed and hemostasis obtained using a 5-Fr ExoSeal closure device. Good proximal and distal lower extremity pulses were documented upon achievement of hemostasis.    The procedure was well tolerated and no early complications were observed.       The patient was transferred back to the holding area to be positioned flat in bed for 3 hours of observation.    IMPRESSION:  1. 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 2. Approximately 4.5 mm aneurysm of the right posterior communicating artery, as described above.  The preliminary results of this procedure were shared with the patient and the patient's family.

## 2016-06-19 ENCOUNTER — Encounter (HOSPITAL_COMMUNITY): Payer: Self-pay | Admitting: Neurosurgery

## 2016-06-20 DIAGNOSIS — H40009 Preglaucoma, unspecified, unspecified eye: Secondary | ICD-10-CM | POA: Diagnosis not present

## 2016-06-22 ENCOUNTER — Encounter: Payer: Self-pay | Admitting: Internal Medicine

## 2016-06-29 NOTE — Telephone Encounter (Signed)
Pt would still like to know if ok to take an extra  gabapentin (NEURONTIN) 300 MG capsule. She had asked this in the message. Pt not taking extra every day but has occasionally. So will need an early refill.  Also gave pt number to [place] to follow up on cat scan

## 2016-06-29 NOTE — Pre-Procedure Instructions (Addendum)
    [name]  06/29/2016    [id number] [place] [address]Phone: [phone]: [phone]  Your procedure is scheduled on Friday, November 10.  [facility]                 Your surgery or procedure is scheduled for 11:45 AM   Call this number if you have problems the morning of surgery: [phone]             For any other questions, please call [phone], Monday - Friday 8 AM - 4 PM.    Remember:  Do not eat food or drink liquids after midnight Thursday, November 9.   Take these medicines the morning of surgery with A SIP OF WATER: DULoxetine (CYMBALTA), gabapentin (NEURONTIN), levothyroxine (SYNTHROID).                 Please follow Dr. NCleotilde Neerinstructions for Plavix and Aspirin. .            If you have not stopped the following medications, STOP today: Ascorbic Acid (VITAMIN C WITH ROSE HIPS), meloxicam (MOBIC), Multiple Vitamins-Minerals (MULTIVITAMIN WITH MINERALS), Omega-3 Fatty Acids (FISH OIL).        Do take any Aspirin Products, Ibuprofen (Advil) or naproxen (Aleve).    Do not wear jewelry, make-up or nail polish.  Do not wear lotions, powders, or perfumes, or deodorant.  Do not shave 48 hours prior to surgery.    Do not bring valuables to the hospital.  [facility] or [facility].  Contacts, dentures or bridgework may not be worn into surgery.  Leave your suitcase in the car.  After surgery it may be brought to your room.  For patients admitted to the hospital, discharge time will be determined by your treatment team.  Patients discharged the day of surgery will not be allowed to drive home.   Special instructions: Review  [facility] - Preparing For Surgery.  Please read over the following fact sheets that you were given: [facility], Coughing and Deep Breathing, Pain  Booklet

## 2016-06-30 ENCOUNTER — Encounter (HOSPITAL_COMMUNITY)
Admission: RE | Admit: 2016-06-30 | Discharge: 2016-06-30 | Disposition: A | Discharge status: Home / Self Care | Payer: Medicare Other | Source: Ambulatory Visit | Attending: Neurosurgery | Admitting: Neurosurgery

## 2016-06-30 ENCOUNTER — Encounter (HOSPITAL_COMMUNITY): Payer: Self-pay

## 2016-06-30 DIAGNOSIS — E039 Hypothyroidism, unspecified: Secondary | ICD-10-CM | POA: Diagnosis not present

## 2016-06-30 DIAGNOSIS — Z01812 Encounter for preprocedural laboratory examination: Secondary | ICD-10-CM

## 2016-06-30 DIAGNOSIS — M1990 Unspecified osteoarthritis, unspecified site: Secondary | ICD-10-CM | POA: Diagnosis not present

## 2016-06-30 DIAGNOSIS — I671 Cerebral aneurysm, nonruptured: Secondary | ICD-10-CM

## 2016-06-30 DIAGNOSIS — K219 Gastro-esophageal reflux disease without esophagitis: Secondary | ICD-10-CM | POA: Diagnosis not present

## 2016-06-30 DIAGNOSIS — I10 Essential (primary) hypertension: Secondary | ICD-10-CM | POA: Diagnosis not present

## 2016-06-30 DIAGNOSIS — Z79899 Other long term (current) drug therapy: Secondary | ICD-10-CM | POA: Diagnosis not present

## 2016-06-30 DIAGNOSIS — M479 Spondylosis, unspecified: Secondary | ICD-10-CM | POA: Diagnosis not present

## 2016-06-30 HISTORY — DX: Depression, unspecified: F32.A

## 2016-06-30 HISTORY — DX: Major depressive disorder, single episode, unspecified: F32.9

## 2016-06-30 HISTORY — DX: Anxiety disorder, unspecified: F41.9

## 2016-06-30 LAB — CBC WITH DIFFERENTIAL/PLATELET
BASOS ABS: 0 10*3/uL (ref 0.0–0.1)
BASOS PCT: 0 %
EOS PCT: 3 %
Eosinophils Absolute: 0.3 10*3/uL (ref 0.0–0.7)
HCT: 33.3 % — ABNORMAL LOW (ref 36.0–46.0)
Hemoglobin: 11.1 g/dL — ABNORMAL LOW (ref 12.0–15.0)
LYMPHS PCT: 20 %
Lymphs Abs: 1.8 10*3/uL (ref 0.7–4.0)
MCH: 31.4 pg (ref 26.0–34.0)
MCHC: 33.3 g/dL (ref 30.0–36.0)
MCV: 94.3 fL (ref 78.0–100.0)
MONO ABS: 0.8 10*3/uL (ref 0.1–1.0)
MONOS PCT: 9 %
Neutro Abs: 6 10*3/uL (ref 1.7–7.7)
Neutrophils Relative %: 68 %
PLATELETS: 434 10*3/uL — AB (ref 150–400)
RBC: 3.53 MIL/uL — ABNORMAL LOW (ref 3.87–5.11)
RDW: 14 % (ref 11.5–15.5)
WBC: 8.9 10*3/uL (ref 4.0–10.5)

## 2016-06-30 LAB — PROTIME-INR
INR: 1
Prothrombin Time: 13.3 seconds (ref 11.4–15.2)

## 2016-06-30 LAB — BASIC METABOLIC PANEL
Anion gap: 9 (ref 5–15)
BUN: 6 mg/dL (ref 6–20)
CHLORIDE: 103 mmol/L (ref 101–111)
CO2: 24 mmol/L (ref 22–32)
CREATININE: 0.84 mg/dL (ref 0.44–1.00)
Calcium: 9.1 mg/dL (ref 8.9–10.3)
GFR calc non Af Amer: >60 mL/min (ref >60)
GLUCOSE: 73 mg/dL (ref 65–99)
Potassium: 3.5 mmol/L (ref 3.5–5.1)
Sodium: 136 mmol/L (ref 135–145)

## 2016-06-30 LAB — APTT: aPTT: 29 seconds (ref 24–36)

## 2016-06-30 NOTE — Progress Notes (Signed)
Patient unable to void at PAT appointment.  Nurse gave patient water and asked patient to attempt but patient still unable to void.  Left message for [facility] at Dr. Cleotilde Neer office.  Will attempt to collect urine DOS.

## 2016-06-30 NOTE — Progress Notes (Signed)
PCP - Dr. Shanon Ace Cardiologist - denies but patient did see Dr. Cathie Olden on 10/11/15  EKG - 10/11/15 CXR - denies Echo - 10/23/15 Stress test/cardiac cath - denies  Patient denies chest pain and shortness of breath at PAT appointment.    Patient states that she was instructed to take Plavix and Aspirin 10 days prior to surgery per Dr. Kathyrn Sheriff.  Nurse instructed patient to stop all other medications that can thin the blood (Meloxicam, Fish oil, multivitamins).

## 2016-06-30 NOTE — Progress Notes (Signed)
Per Nichole at Dr. Cleotilde Neer office, patient is to take Aspirin and Plavix the morning of surgery.  Patient notified and verbalized understanding.

## 2016-07-03 ENCOUNTER — Inpatient Hospital Stay (HOSPITAL_COMMUNITY): Payer: Medicare Other | Admitting: Anesthesiology

## 2016-07-03 ENCOUNTER — Ambulatory Visit (HOSPITAL_COMMUNITY)
Admission: RE | Admit: 2016-07-03 | Discharge: 2016-07-03 | Disposition: A | Discharge status: Home / Self Care | Payer: Medicare Other | Source: Ambulatory Visit | Attending: Neurosurgery | Admitting: Neurosurgery

## 2016-07-03 ENCOUNTER — Inpatient Hospital Stay (HOSPITAL_COMMUNITY)
Admission: RE | Admit: 2016-07-03 | Discharge: 2016-07-04 | DRG: 027 | Disposition: A | Discharge status: Home / Self Care | Payer: Medicare Other | Source: Ambulatory Visit | Attending: Neurosurgery | Admitting: Neurosurgery

## 2016-07-03 ENCOUNTER — Encounter (HOSPITAL_COMMUNITY)
Admission: RE | Disposition: A | Discharge status: Home / Self Care | Payer: Self-pay | Source: Ambulatory Visit | Attending: Neurosurgery

## 2016-07-03 ENCOUNTER — Encounter (HOSPITAL_COMMUNITY): Payer: Self-pay | Admitting: *Deleted

## 2016-07-03 DIAGNOSIS — Z79899 Other long term (current) drug therapy: Secondary | ICD-10-CM

## 2016-07-03 DIAGNOSIS — M479 Spondylosis, unspecified: Secondary | ICD-10-CM

## 2016-07-03 DIAGNOSIS — F17200 Nicotine dependence, unspecified, uncomplicated: Secondary | ICD-10-CM | POA: Diagnosis present

## 2016-07-03 DIAGNOSIS — I671 Cerebral aneurysm, nonruptured: Secondary | ICD-10-CM | POA: Insufficient documentation

## 2016-07-03 DIAGNOSIS — F17210 Nicotine dependence, cigarettes, uncomplicated: Secondary | ICD-10-CM

## 2016-07-03 DIAGNOSIS — I720 Aneurysm of carotid artery: Secondary | ICD-10-CM | POA: Diagnosis not present

## 2016-07-03 DIAGNOSIS — J310 Chronic rhinitis: Secondary | ICD-10-CM | POA: Diagnosis not present

## 2016-07-03 DIAGNOSIS — L719 Rosacea, unspecified: Secondary | ICD-10-CM | POA: Diagnosis not present

## 2016-07-03 DIAGNOSIS — I10 Essential (primary) hypertension: Secondary | ICD-10-CM | POA: Diagnosis not present

## 2016-07-03 DIAGNOSIS — M1990 Unspecified osteoarthritis, unspecified site: Secondary | ICD-10-CM | POA: Diagnosis not present

## 2016-07-03 DIAGNOSIS — K219 Gastro-esophageal reflux disease without esophagitis: Secondary | ICD-10-CM | POA: Insufficient documentation

## 2016-07-03 DIAGNOSIS — E039 Hypothyroidism, unspecified: Secondary | ICD-10-CM

## 2016-07-03 DIAGNOSIS — Z5181 Encounter for therapeutic drug level monitoring: Secondary | ICD-10-CM

## 2016-07-03 HISTORY — PX: RADIOLOGY WITH ANESTHESIA: SHX6223

## 2016-07-03 HISTORY — PX: IR GENERIC HISTORICAL: IMG1180011

## 2016-07-03 LAB — URINALYSIS, ROUTINE W REFLEX MICROSCOPIC
BILIRUBIN URINE: NEGATIVE
GLUCOSE, UA: NEGATIVE mg/dL
HGB URINE DIPSTICK: NEGATIVE
Ketones, ur: NEGATIVE mg/dL
Nitrite: NEGATIVE
PH: 7 (ref 5.0–8.0)
Protein, ur: NEGATIVE mg/dL
SPECIFIC GRAVITY, URINE: 1.011 (ref 1.005–1.030)

## 2016-07-03 LAB — URINE MICROSCOPIC-ADD ON

## 2016-07-03 SURGERY — RADIOLOGY WITH ANESTHESIA
Anesthesia: General

## 2016-07-03 MED ORDER — LABETALOL HCL 5 MG/ML IV SOLN
10.0000 mg | INTRAVENOUS | Status: DC | PRN
  Administered 2016-07-04: 10 mg via INTRAVENOUS
  Filled 2016-07-03: qty 4

## 2016-07-03 MED ORDER — AMITRIPTYLINE HCL 100 MG PO TABS
300.0000 mg | ORAL_TABLET | Freq: Every day | ORAL | Status: DC
  Filled 2016-07-03: qty 3

## 2016-07-03 MED ORDER — CLOPIDOGREL BISULFATE 75 MG PO TABS
75.0000 mg | ORAL_TABLET | Freq: Every day | ORAL | Status: DC
  Administered 2016-07-04: 75 mg via ORAL
  Filled 2016-07-03: qty 1

## 2016-07-03 MED ORDER — HEPARIN SODIUM (PORCINE) 1000 UNIT/ML IJ SOLN
INTRAMUSCULAR | Status: DC | PRN
  Administered 2016-07-03 (×2): 1000 [IU] via INTRAVENOUS
  Administered 2016-07-03: 5000 [IU] via INTRAVENOUS

## 2016-07-03 MED ORDER — EPHEDRINE SULFATE 50 MG/ML IJ SOLN
INTRAMUSCULAR | Status: DC | PRN
  Administered 2016-07-03 (×2): 10 mg via INTRAVENOUS

## 2016-07-03 MED ORDER — MIDAZOLAM HCL 2 MG/2ML IJ SOLN
2.0000 mg | Freq: Once | INTRAMUSCULAR | Status: AC
  Administered 2016-07-03: 1 mg via INTRAVENOUS
  Administered 2016-07-03: 0.5 mg via INTRAVENOUS

## 2016-07-03 MED ORDER — FENTANYL CITRATE (PF) 100 MCG/2ML IJ SOLN
100.0000 ug | Freq: Once | INTRAMUSCULAR | Status: AC
  Administered 2016-07-03: 50 ug via INTRAVENOUS
  Administered 2016-07-03 (×2): 25 ug via INTRAVENOUS

## 2016-07-03 MED ORDER — HYDROCODONE-ACETAMINOPHEN 5-325 MG PO TABS: 1.0000 | ORAL_TABLET | ORAL | Status: DC | PRN

## 2016-07-03 MED ORDER — CHLORHEXIDINE GLUCONATE CLOTH 2 % EX PADS: 6.0000 | MEDICATED_PAD | Freq: Once | CUTANEOUS | Status: DC

## 2016-07-03 MED ORDER — LEVOTHYROXINE SODIUM 75 MCG PO TABS
75.0000 ug | ORAL_TABLET | Freq: Every day | ORAL | Status: DC
  Administered 2016-07-04: 75 ug via ORAL
  Filled 2016-07-03: qty 1

## 2016-07-03 MED ORDER — LACTATED RINGERS IV SOLN
INTRAVENOUS | Status: DC
  Administered 2016-07-03: 10:00:00 via INTRAVENOUS

## 2016-07-03 MED ORDER — MORPHINE SULFATE (PF) 2 MG/ML IV SOLN: 1.0000 mg | INTRAVENOUS | Status: DC | PRN

## 2016-07-03 MED ORDER — LIDOCAINE HCL (CARDIAC) 20 MG/ML IV SOLN
INTRAVENOUS | Status: DC | PRN
  Administered 2016-07-03: 40 mg via INTRAVENOUS

## 2016-07-03 MED ORDER — SUGAMMADEX SODIUM 200 MG/2ML IV SOLN
INTRAVENOUS | Status: DC | PRN
  Administered 2016-07-03: 100 mg via INTRAVENOUS

## 2016-07-03 MED ORDER — ONDANSETRON HCL 4 MG/2ML IJ SOLN
INTRAMUSCULAR | Status: DC | PRN
Start: 2016-07-03 — End: 2016-07-03
  Administered 2016-07-03: 4 mg via INTRAVENOUS

## 2016-07-03 MED ORDER — ONDANSETRON HCL 4 MG/2ML IJ SOLN: 4.0000 mg | INTRAMUSCULAR | Status: DC | PRN

## 2016-07-03 MED ORDER — HYDROMORPHONE HCL 1 MG/ML IJ SOLN: 0.2500 mg | INTRAMUSCULAR | Status: DC | PRN

## 2016-07-03 MED ORDER — ASPIRIN EC 325 MG PO TBEC
325.0000 mg | DELAYED_RELEASE_TABLET | Freq: Every day | ORAL | Status: DC
  Administered 2016-07-04: 325 mg via ORAL
  Filled 2016-07-03: qty 1

## 2016-07-03 MED ORDER — POLYETHYLENE GLYCOL 3350 17 G PO PACK: 17.0000 g | PACK | Freq: Every day | ORAL | Status: DC | PRN

## 2016-07-03 MED ORDER — OMEGA-3-ACID ETHYL ESTERS 1 G PO CAPS
1.0000 g | ORAL_CAPSULE | Freq: Every day | ORAL | Status: DC
  Administered 2016-07-04: 1 g via ORAL
  Filled 2016-07-03: qty 1

## 2016-07-03 MED ORDER — ONDANSETRON HCL 4 MG PO TABS: 4.0000 mg | ORAL_TABLET | ORAL | Status: DC | PRN

## 2016-07-03 MED ORDER — FENTANYL CITRATE (PF) 100 MCG/2ML IJ SOLN
INTRAMUSCULAR | Status: AC
  Administered 2016-07-03: 50 ug via INTRAVENOUS
  Filled 2016-07-03: qty 2

## 2016-07-03 MED ORDER — MIDAZOLAM HCL 2 MG/2ML IJ SOLN
INTRAMUSCULAR | Status: AC
  Administered 2016-07-03: 0.5 mg via INTRAVENOUS
  Filled 2016-07-03: qty 2

## 2016-07-03 MED ORDER — ESMOLOL HCL 100 MG/10ML IV SOLN
INTRAVENOUS | Status: AC
  Filled 2016-07-03: qty 10

## 2016-07-03 MED ORDER — CLOPIDOGREL BISULFATE 75 MG PO TABS: 75.0000 mg | ORAL_TABLET | Freq: Every day | ORAL | Status: DC

## 2016-07-03 MED ORDER — PHENYLEPHRINE HCL 10 MG/ML IJ SOLN
INTRAMUSCULAR | Status: DC | PRN
  Administered 2016-07-03: 160 ug via INTRAVENOUS
  Administered 2016-07-03: 120 ug via INTRAVENOUS

## 2016-07-03 MED ORDER — ADULT MULTIVITAMIN W/MINERALS CH
1.0000 | ORAL_TABLET | Freq: Every day | ORAL | Status: DC
  Administered 2016-07-04: 1 via ORAL
  Filled 2016-07-03: qty 1

## 2016-07-03 MED ORDER — GABAPENTIN 300 MG PO CAPS
300.0000 mg | ORAL_CAPSULE | Freq: Every morning | ORAL | Status: DC
  Administered 2016-07-04: 300 mg via ORAL
  Filled 2016-07-03: qty 1

## 2016-07-03 MED ORDER — GABAPENTIN 300 MG PO CAPS
600.0000 mg | ORAL_CAPSULE | Freq: Every day | ORAL | Status: DC
  Administered 2016-07-04: 600 mg via ORAL
  Filled 2016-07-03: qty 2

## 2016-07-03 MED ORDER — PROMETHAZINE HCL 25 MG/ML IJ SOLN: 6.2500 mg | INTRAMUSCULAR | Status: DC | PRN

## 2016-07-03 MED ORDER — VITAMIN C 500 MG PO TABS
1000.0000 mg | ORAL_TABLET | Freq: Every day | ORAL | Status: DC
  Administered 2016-07-04: 1000 mg via ORAL
  Filled 2016-07-03: qty 2

## 2016-07-03 MED ORDER — SODIUM CHLORIDE 0.9 % IV SOLN
INTRAVENOUS | Status: DC | PRN
  Administered 2016-07-03 (×2): via INTRAVENOUS

## 2016-07-03 MED ORDER — FENTANYL CITRATE (PF) 100 MCG/2ML IJ SOLN
INTRAMUSCULAR | Status: DC | PRN
  Administered 2016-07-03: 100 ug via INTRAVENOUS
  Administered 2016-07-03 (×2): 50 ug via INTRAVENOUS

## 2016-07-03 MED ORDER — ASPIRIN 325 MG PO TABS: 325.0000 mg | ORAL_TABLET | Freq: Every day | ORAL | Status: DC

## 2016-07-03 MED ORDER — SODIUM CHLORIDE 0.9 % IV SOLN
INTRAVENOUS | Status: DC
  Administered 2016-07-04 (×2): via INTRAVENOUS

## 2016-07-03 MED ORDER — ROCURONIUM BROMIDE 100 MG/10ML IV SOLN
INTRAVENOUS | Status: DC | PRN
  Administered 2016-07-03: 10 mg via INTRAVENOUS
  Administered 2016-07-03: 40 mg via INTRAVENOUS
  Administered 2016-07-03: 30 mg via INTRAVENOUS
  Administered 2016-07-03: 20 mg via INTRAVENOUS

## 2016-07-03 MED ORDER — IOPAMIDOL (ISOVUE-300) INJECTION 61%
150.0000 mL | Freq: Once | INTRAVENOUS | Status: AC | PRN
  Administered 2016-07-03: 80 mL via INTRA_ARTERIAL

## 2016-07-03 MED ORDER — PHENYLEPHRINE HCL 10 MG/ML IJ SOLN
INTRAMUSCULAR | Status: DC | PRN
  Administered 2016-07-03: 30 ug/min via INTRAVENOUS

## 2016-07-03 MED ORDER — PROPOFOL 10 MG/ML IV BOLUS
INTRAVENOUS | Status: DC | PRN
  Administered 2016-07-03: 130 mg via INTRAVENOUS

## 2016-07-03 MED ORDER — PANTOPRAZOLE SODIUM 20 MG PO TBEC
20.0000 mg | DELAYED_RELEASE_TABLET | Freq: Every day | ORAL | Status: DC
  Administered 2016-07-04: 20 mg via ORAL
  Filled 2016-07-03: qty 1

## 2016-07-03 MED ORDER — IOPAMIDOL (ISOVUE-300) INJECTION 61%
INTRAVENOUS | Status: AC
  Filled 2016-07-03: qty 150

## 2016-07-03 NOTE — Anesthesia Preprocedure Evaluation (Addendum)
Anesthesia Evaluation  Patient identified by MRN, date of birth, ID band Patient awake  General Assessment Comment:Hyperthyroidism Skin cancer   Reviewed: Allergy & Precautions, H&P , NPO status , Patient's Chart, lab work & pertinent test results  History of Anesthesia Complications Negative for: history of anesthetic complications  Airway Mallampati: II  TM Distance: >3 FB Neck ROM: Full    Dental no notable dental hx. (+) Dental Advisory Given, Teeth Intact   Pulmonary Current Smoker,    Pulmonary exam normal breath sounds clear to auscultation       Cardiovascular Exercise Tolerance: Good hypertension, Pt. on medications Normal cardiovascular exam Rhythm:Regular Rate:Normal  Study Conclusions  - Left ventricle: The cavity size was normal. There was mild focal   basal hypertrophy of the septum. Systolic function was normal.   The estimated ejection fraction was in the range of 60% to 65%.   Wall motion was normal; there were no regional wall motion   abnormalities   Neuro/Psych PSYCHIATRIC DISORDERS Anxiety Depression negative psych ROS   GI/Hepatic Neg liver ROS, GERD  Medicated,  Endo/Other  Hypothyroidism   Renal/GU negative Renal ROS  negative genitourinary   Musculoskeletal negative musculoskeletal ROS (+)   Abdominal   Peds negative pediatric ROS (+)  Hematology negative hematology ROS (+)   Anesthesia Other Findings   Reproductive/Obstetrics negative OB ROS                            Anesthesia Physical  Anesthesia Plan  ASA: II  Anesthesia Plan: General   Post-op Pain Management:    Induction: Intravenous  Airway Management Planned: Oral ETT  Additional Equipment: Arterial line  Intra-op Plan:   Post-operative Plan: Extubation in OR  Informed Consent: I have reviewed the patients History and Physical, chart, labs and discussed the procedure including the  risks, benefits and alternatives for the proposed anesthesia with the patient or authorized representative who has indicated his/her understanding and acceptance.   Dental advisory given  Plan Discussed with: CRNA and Anesthesiologist  Anesthesia Plan Comments:        Anesthesia Quick Evaluation

## 2016-07-03 NOTE — Anesthesia Postprocedure Evaluation (Signed)
Anesthesia Post Note  Patient: [name]  Procedure(s) Performed: Procedure(s) (LRB): arteriogram, pipeline embolization of aneurysm (N/A)  Anesthesia Post Evaluation  Last Vitals:  Vitals:   07/03/16 2307 07/03/16 2315  BP: 126/67   Pulse: 94 94  Resp: 16 14  Temp:      Last Pain:  Vitals:   07/03/16 2304  TempSrc:   PainSc: 0-No pain                 Kayliegh Boyers S

## 2016-07-03 NOTE — Transfer of Care (Signed)
Immediate Anesthesia Transfer of Care Note  Patient: [name]  Procedure(s) Performed: Procedure(s): arteriogram, pipeline embolization of aneurysm (N/A)  Patient Location: PACU  Anesthesia Type:General  Level of Consciousness: awake and confused  Airway & Oxygen Therapy: Patient Spontanous Breathing and Patient connected to nasal cannula oxygen  Post-op Assessment: Report given to RN and able to move right leg, right and left hand, not moving left foot at this time. Surgeon aware  Post vital signs: Reviewed  Last Vitals:  Vitals:   07/03/16 1001 07/03/16 2034  BP: (!) 157/86   Pulse:    Resp:    Temp:  36.2 C    Last Pain:  Vitals:   07/03/16 0937  TempSrc: Oral      Patients Stated Pain Goal: 3 (57/84/69 6295)  Complications: No apparent anesthesia complications

## 2016-07-03 NOTE — Progress Notes (Addendum)
Pain scale originally at an 8.  Now presently at 2 after pain medication.    DA

## 2016-07-03 NOTE — Interval H&P Note (Signed)
History and Physical Interval Note:  07/03/2016 10:29 AM  [name]  has presented today for surgery, with the diagnosis of non ruptured cerebral aneurysm  The various methods of treatment have been discussed with the patient and her husband in the office. After consideration of risks, benefits and other options for treatment, the patient has consented to proceed with Pipeline embolization of her aneurysm. She has been on daily ASA and Plavix for 10 days. The patient's history has been reviewed, patient examined, no change in status, stable for surgery.  I have reviewed the patient's chart and labs.  Questions were answered to the patient's satisfaction.     [name], C

## 2016-07-03 NOTE — H&P (View-Only) (Signed)
CC:  Aneurysms  HPI: [name] is a 70 y.o. female with a history of previously seen intracranial aneurysms. Recent CT for HA has demonstrated slight enlargment of the right Pcom aneurysm. She therefore presents for further workup with diagnostic cerebral angiogram  PMH: Past Medical History:  Diagnosis Date  . GERD (gastroesophageal reflux disease)    WOULD USE OTC MEDICATION IF NEEDED  . Hypothyroidism   . 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 Rosacea   . Tachycardia    PT STATES HER HEART RATE USUALLY ABOVE 100    PSH: Past Surgical History:  Procedure Laterality Date  . BREAST BIOPSY     bilateral benign  . BUNIONECTOMY  2003   bilateral  . LUMBAR LAMINECTOMY/DECOMPRESSION MICRODISCECTOMY Left 03/17/2013   Procedure: LUMBAR LAMINECTOMY/DECOMPRESSION IF RECESS STENOSIS FORAMINOTOMIE OF L4 ROOT AND L5,MICRODISECTOMY  L4-5 ON LEFT;  Surgeon: Tobi Bastos, MD;  Location: [facility];  Service: Orthopedics;  Laterality: Left;  . TONSILLECTOMY  1953    SH: Social History  Substance Use Topics  . Smoking status: Current Every Day Smoker    Packs/day: 1.00    Types: Cigarettes  . Smokeless tobacco: Never Used     Comment: back to tobacco 10 per day   . Alcohol use 0.0 oz/week    MEDS: Prior to Admission medications   Medication Sig Start Date End Date Taking? Authorizing Provider  amitriptyline (ELAVIL) 100 MG tablet TAKE 3 TABLETS BY MOUTH EVERY DAY 04/08/16  Yes Burnis Medin, MD  Ascorbic Acid (VITAMIN C WITH ROSE HIPS) 1000 MG tablet Take 1,000 mg by mouth daily.   Yes Historical Provider, MD  Biotin 5000 MCG TABS Take 5,000 mcg by mouth daily.    Yes Historical Provider, MD  DULoxetine (CYMBALTA) 30 MG capsule Take 1 capsule (30 mg total) by mouth daily. 11/18/15  Yes Burnis Medin, MD  gabapentin (NEURONTIN) 300 MG capsule TAKE ONE CAPSULE IN THE DAYTIME AND 2 CAPSULE AT BEDTIME 03/18/16  Yes Burnis Medin, MD  levothyroxine (SYNTHROID) 75 MCG  tablet TAKE 1 TABLET BY MOUTH DAILY BEFORE BREAKFAST. 02/17/16  Yes Burnis Medin, MD  meloxicam (MOBIC) 15 MG tablet TAKE 1 TABLET (15 MG TOTAL) BY MOUTH DAILY. AS NEEDED FOR PAIN 03/18/16  Yes Burnis Medin, MD  Multiple Vitamins-Minerals (MULTIVITAMIN WITH MINERALS) tablet Take 1 tablet by mouth daily.   Yes Historical Provider, MD  polyethylene glycol (MIRALAX / GLYCOLAX) packet Take 17 g by mouth daily as needed (for constipation).    Yes Historical Provider, MD  potassium chloride SA (K-DUR,KLOR-CON) 20 MEQ tablet Take 1 tablet (20 mEq total) by mouth daily. 06/01/16  Yes Burnis Medin, MD  sulfamethoxazole-trimethoprim (BACTRIM DS,SEPTRA DS) 800-160 MG tablet Take 1 tablet by mouth 2 (two) times daily. 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 There is also a slightly smaller right posterior communicating  artery aneurysm which is apparently slightly enlarged in comparison at 2015. 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   IMPRESSION: - 70 year old woman with multiple intracranial aneurysms including 7 mm left Pcom, and slightly smaller right Pcom which may be enlarged over the last two years. 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 Given her age and the size of these aneurysms, I do think that treatment would be warranted.   PLAN: - Proceed with diagnostic cerebral angiogram  I have  reviewed the risks, benefits, and alternatives with the patient and her husband in the office. All questions were answered.

## 2016-07-03 NOTE — Anesthesia Procedure Notes (Signed)
Procedure Name: Intubation Date/Time: 07/03/2016 4:53 PM Performed by: Sampson Si E Pre-anesthesia Checklist: Patient identified, Emergency Drugs available, Suction available and Patient being monitored Patient Re-evaluated:Patient Re-evaluated prior to inductionOxygen Delivery Method: Circle System Utilized Preoxygenation: Pre-oxygenation with 100% oxygen Intubation Type: IV induction Ventilation: Mask ventilation without difficulty Laryngoscope Size: Mac and 3 Grade View: Grade I Tube type: Subglottic suction tube Tube size: 7.0 mm Number of attempts: 1 Airway Equipment and Method: Stylet and Oral airway Placement Confirmation: ETT inserted through vocal cords under direct vision,  positive ETCO2 and breath sounds checked- equal and bilateral Secured at: 21 cm Tube secured with: Tape Dental Injury: Teeth and Oropharynx as per pre-operative assessment

## 2016-07-03 NOTE — Brief Op Note (Signed)
PREOP DX: Left ICA aneurysms  POSTOP DX: Same  PROCEDURE: Pipeline embolization LICA aneurysms  SURGEON: Toniann Dickerson  ASSISTANT: None  ANESTHESIA: GETA  EBL: Minimal  SPECIMENS: None  DRAINS: None  COMPLICATIONS: None immediate  CONDITION: Hemodynamically stable to PACU  FINDINGS: 4.25 x 16 mm Pipeline Flex, 5.0 x 74m Pipeline Flex deployed. No branch occlusions seen post-procedure

## 2016-07-04 LAB — MRSA PCR SCREENING: MRSA BY PCR: NEGATIVE

## 2016-07-04 NOTE — Progress Notes (Signed)
Patient ID: [name], female   DOB: 1946-02-26, 70 y.o.   MRN: [id number] Subjective: Patient reports no headache ort other issues over night  Objective: Vital signs in last 24 hours: Temp:  [97.2 F (36.2 C)-98.3 F (36.8 C)] 98.3 F (36.8 C) (11/11 0400) Pulse Rate:  [80-95] 80 (11/11 0100) Resp:  [13-25] 14 (11/11 0100) BP: (100-157)/(59-86) 148/79 (11/11 0100) SpO2:  [92 %-100 %] 97 % (11/11 0100) Arterial Line BP: (110-168)/(55-83) 168/83 (11/11 0100) Weight:  [46.8 kg (103 lb 1.6 oz)-50.1 kg (110 lb 7.2 oz)] 50.1 kg (110 lb 7.2 oz) (11/11 0000)  Intake/Output from previous day: 11/10 0701 - 11/11 0700 In: 2075 [I.V.:2075] Out: 2000 [Urine:1950; Blood:50] Intake/Output this shift: Total I/O In: 975 [I.V.:975] Out: 1150 [Urine:1100; Blood:50]  Neurologic: Grossly normal  Lab Results: Lab Results  Component Value Date   WBC 8.9 06/30/2016   HGB 11.1 (L) 06/30/2016   HCT 33.3 (L) 06/30/2016   MCV 94.3 06/30/2016   PLT 434 (H) 06/30/2016   Lab Results  Component Value Date   INR 1.00 06/30/2016   BMET Lab Results  Component Value Date   NA 136 06/30/2016   K 3.5 06/30/2016   CL 103 06/30/2016   CO2 24 06/30/2016   GLUCOSE 73 06/30/2016   BUN 6 06/30/2016   CREATININE 0.84 06/30/2016   CALCIUM 9.1 06/30/2016    Studies/Results: No results found.  Assessment/Plan: Doing well   LOS: 1 day    Coby Antrobus S 07/04/2016, 5:41 AM

## 2016-07-04 NOTE — Discharge Summary (Signed)
  Physician Discharge Summary  Patient ID: NEKESHA FONT MRN: [id number] DOB/AGE: 04/25/1946 70 y.o.  Admit date: 07/03/2016 Discharge date: 07/04/2016  Admission Diagnoses:  Aneurysm, cerebral  Discharge Diagnoses:  Same Active Problems:   Cerebral aneurysm, nonruptured   Discharged Condition: Stable  Hospital Course:  YAIRA BERNARDI is a 70 y.o. female electively admitted after uncomplicated Pipeline embolization of LICA aneurysms. She was at baseline postop, ambulating well, tolerating diet, voiding normally.  Treatments: Surgery - Pipeline embolization LICA aneurysms  Discharge Exam: Blood pressure 100/86, pulse 85, temperature 99 F (37.2 C), temperature source Oral, resp. rate 15, height '5\' 2"'$  (1.575 m), weight 50.1 kg (110 lb 7.2 oz), SpO2 94 %. Awake, alert, oriented Speech fluent, appropriate CN grossly intact 5/5 BUE/BLE Wound c/d/i  Disposition: 01-Home or Self Care     Medication List    STOP taking these medications   meloxicam 15 MG tablet Commonly known as:  MOBIC     TAKE these medications   amitriptyline 100 MG tablet Commonly known as:  ELAVIL TAKE 3 TABLETS BY MOUTH EVERY DAY   aspirin EC 325 MG tablet Take 10 days prior to procedure   clopidogrel 75 MG tablet Commonly known as:  PLAVIX Take 10 days prior to procedure   DULoxetine 30 MG capsule Commonly known as:  CYMBALTA Take 1 capsule (30 mg total) by mouth daily.   Fish Oil 1000 MG Caps Take 1 capsule by mouth daily.   gabapentin 300 MG capsule Commonly known as:  NEURONTIN TAKE ONE CAPSULE IN THE DAYTIME AND 2 CAPSULE AT BEDTIME   levothyroxine 75 MCG tablet Commonly known as:  SYNTHROID TAKE 1 TABLET BY MOUTH DAILY BEFORE BREAKFAST.   multivitamin with minerals tablet Take 1 tablet by mouth daily.   polyethylene glycol packet Commonly known as:  MIRALAX / GLYCOLAX Take 17 g by mouth daily as needed (for constipation).   vitamin C with rose hips 1000 MG  tablet Take 1,000 mg by mouth daily.      Follow-up Information    Lucia Harm, C, MD Follow up in 4 week(s).   Specialty:  Neurosurgery Contact information: [address] [place] [address] [place] [address] [phone]           Signed: Consuella Lose, Loletha Grayer 07/04/2016, 10:17 AM

## 2016-07-05 ENCOUNTER — Encounter (HOSPITAL_COMMUNITY): Payer: Self-pay | Admitting: Neurosurgery

## 2016-07-06 DIAGNOSIS — I671 Cerebral aneurysm, nonruptured: Secondary | ICD-10-CM | POA: Diagnosis not present

## 2016-07-07 ENCOUNTER — Telehealth: Payer: Self-pay

## 2016-07-07 NOTE — Telephone Encounter (Signed)
LMCB

## 2016-07-09 NOTE — Telephone Encounter (Signed)
Pt was surgical admit. No need for TCM call.

## 2016-07-14 ENCOUNTER — Encounter (HOSPITAL_COMMUNITY): Payer: Self-pay | Admitting: Neurosurgery

## 2016-07-15 ENCOUNTER — Encounter (HOSPITAL_COMMUNITY): Payer: Self-pay | Admitting: Radiology

## 2016-07-20 ENCOUNTER — Telehealth: Payer: Self-pay | Admitting: Neurology

## 2016-07-20 NOTE — Telephone Encounter (Signed)
PT's husband called and would like a call back in regards to getting some physical therapy/Dawn CB# [phone]

## 2016-07-20 NOTE — Telephone Encounter (Signed)
Talked with [name] at Dr. Cleotilde Neer office. She states he is on vacation but he she was going to get message to doctor on call.   Notified patient.

## 2016-07-20 NOTE — Telephone Encounter (Signed)
Patient's husband called and states since she had surgery for aneurysms on right side Nov 10th she has had a hard time using or raising right arm. She can walk but dragging right foot. Patient was in to see surgeon the following Monday and husband states Dr. Kathyrn Sheriff told them it could be a small stroke or brain bleed but he didn't order anything to confirm that. He recommended physical therapy but he can't get their office to follow contact him back about ordering it. He contacted [facility] and they told him he would need a order. He states patient was on Plavix and Asprin but he noticed a negative effect with Plavix and stopped giving it to her. Patients husband wants some advise on what he should do.

## 2016-07-22 DIAGNOSIS — I671 Cerebral aneurysm, nonruptured: Secondary | ICD-10-CM | POA: Diagnosis not present

## 2016-07-22 DIAGNOSIS — R279 Unspecified lack of coordination: Secondary | ICD-10-CM | POA: Diagnosis not present

## 2016-07-23 ENCOUNTER — Emergency Department (HOSPITAL_COMMUNITY)
Admission: EM | Admit: 2016-07-23 | Discharge: 2016-07-23 | Disposition: A | Discharge status: Home / Self Care | Payer: Medicare Other | Attending: Emergency Medicine | Admitting: Emergency Medicine

## 2016-07-23 ENCOUNTER — Emergency Department (HOSPITAL_COMMUNITY): Payer: Medicare Other

## 2016-07-23 ENCOUNTER — Encounter (HOSPITAL_COMMUNITY): Payer: Self-pay | Admitting: Vascular Surgery

## 2016-07-23 DIAGNOSIS — R109 Unspecified abdominal pain: Secondary | ICD-10-CM | POA: Diagnosis not present

## 2016-07-23 DIAGNOSIS — Z7982 Long term (current) use of aspirin: Secondary | ICD-10-CM | POA: Diagnosis not present

## 2016-07-23 DIAGNOSIS — E039 Hypothyroidism, unspecified: Secondary | ICD-10-CM | POA: Diagnosis not present

## 2016-07-23 DIAGNOSIS — F17210 Nicotine dependence, cigarettes, uncomplicated: Secondary | ICD-10-CM | POA: Insufficient documentation

## 2016-07-23 DIAGNOSIS — R1031 Right lower quadrant pain: Secondary | ICD-10-CM | POA: Diagnosis not present

## 2016-07-23 HISTORY — DX: Transient cerebral ischemic attack, unspecified: G45.9

## 2016-07-23 LAB — COMPREHENSIVE METABOLIC PANEL
ALT: 11 U/L — ABNORMAL LOW (ref 14–54)
ANION GAP: 11 (ref 5–15)
AST: 18 U/L (ref 15–41)
Albumin: 2.9 g/dL — ABNORMAL LOW (ref 3.5–5.0)
Alkaline Phosphatase: 76 U/L (ref 38–126)
BILIRUBIN TOTAL: 0.1 mg/dL — AB (ref 0.3–1.2)
BUN: 10 mg/dL (ref 6–20)
CHLORIDE: 104 mmol/L (ref 101–111)
CO2: 22 mmol/L (ref 22–32)
Calcium: 9.2 mg/dL (ref 8.9–10.3)
Creatinine, Ser: 1.34 mg/dL — ABNORMAL HIGH (ref 0.44–1.00)
GFR calc Af Amer: 46 mL/min — ABNORMAL LOW (ref >60)
GFR, EST NON AFRICAN AMERICAN: 40 mL/min — AB (ref >60)
Glucose, Bld: 137 mg/dL — ABNORMAL HIGH (ref 65–99)
POTASSIUM: 3.4 mmol/L — AB (ref 3.5–5.1)
Sodium: 137 mmol/L (ref 135–145)
TOTAL PROTEIN: 6 g/dL — AB (ref 6.5–8.1)

## 2016-07-23 LAB — URINALYSIS, ROUTINE W REFLEX MICROSCOPIC
BILIRUBIN URINE: NEGATIVE
Glucose, UA: NEGATIVE mg/dL
HGB URINE DIPSTICK: NEGATIVE
KETONES UR: NEGATIVE mg/dL
Leukocytes, UA: NEGATIVE
NITRITE: NEGATIVE
PH: 7.5 (ref 5.0–8.0)
Protein, ur: NEGATIVE mg/dL
SPECIFIC GRAVITY, URINE: 1.014 (ref 1.005–1.030)

## 2016-07-23 LAB — CBC
HEMATOCRIT: 29.9 % — AB (ref 36.0–46.0)
Hemoglobin: 9.7 g/dL — ABNORMAL LOW (ref 12.0–15.0)
MCH: 30.7 pg (ref 26.0–34.0)
MCHC: 32.4 g/dL (ref 30.0–36.0)
MCV: 94.6 fL (ref 78.0–100.0)
PLATELETS: 502 10*3/uL — AB (ref 150–400)
RBC: 3.16 MIL/uL — ABNORMAL LOW (ref 3.87–5.11)
RDW: 13.7 % (ref 11.5–15.5)
WBC: 17.3 10*3/uL — ABNORMAL HIGH (ref 4.0–10.5)

## 2016-07-23 LAB — LIPASE, BLOOD: LIPASE: 34 U/L (ref 11–51)

## 2016-07-23 MED ORDER — IOPAMIDOL (ISOVUE-300) INJECTION 61%
INTRAVENOUS | Status: AC
  Administered 2016-07-23: 100 mL
  Filled 2016-07-23: qty 100

## 2016-07-23 MED ORDER — SODIUM CHLORIDE 0.9 % IV BOLUS (SEPSIS)
500.0000 mL | Freq: Once | INTRAVENOUS | Status: AC
  Administered 2016-07-23: 500 mL via INTRAVENOUS

## 2016-07-23 MED ORDER — ACETAMINOPHEN 500 MG PO TABS
1000.0000 mg | ORAL_TABLET | Freq: Once | ORAL | Status: AC
  Administered 2016-07-23: 1000 mg via ORAL
  Filled 2016-07-23: qty 2

## 2016-07-23 MED ORDER — SODIUM CHLORIDE 0.9 % IV BOLUS (SEPSIS)
1000.0000 mL | Freq: Once | INTRAVENOUS | Status: AC
  Administered 2016-07-23: 1000 mL via INTRAVENOUS

## 2016-07-23 MED ORDER — IOPAMIDOL (ISOVUE-300) INJECTION 61%
INTRAVENOUS | Status: AC
  Filled 2016-07-23: qty 30

## 2016-07-23 NOTE — ED Provider Notes (Signed)
[facility] DEPT Provider Note   CSN: [id number] Arrival date & time: 07/23/16  1114     History   Chief Complaint Chief Complaint  Patient presents with  . Abdominal Pain    HPI [name] is a 70 y.o. female.  Patient c/o right sided abdominal pain onset this AM. Was mod-severe, dull, non radiating. States similar right sided abdominal pain for the past 3-4 months. Occurs randomly, without specific exacerbating or alleviating factors. No relation to food/eating. Denies vomiting or diarrhea. No constipation. No abd distension. No hx gallstones. No gu c/o. No fever or chills.  pcp had arranged for outpatient ct tomorrow.    The history is provided by the patient and a relative.  Abdominal Pain   Pertinent negatives include fever, vomiting, dysuria and headaches.    Past Medical History:  Diagnosis Date  . Anxiety   . Depression   . GERD (gastroesophageal reflux disease)    WOULD USE OTC MEDICATION IF NEEDED  . Hypothyroidism   . 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 Rosacea   . Tachycardia    PT STATES HER HEART RATE USUALLY ABOVE 100  . TIA (transient ischemic attack)     Patient Active Problem List   Diagnosis Date Noted  . Cerebral aneurysm, nonruptured 07/03/2016  . Right lower quadrant abdominal pain 05/20/2016  . Cervical spine disease 05/20/2016  . Frequent falls 05/05/2016  . Palpable abdominal aorta 10/11/2015  . Orthostatic hypotension 10/11/2015  . Hx of falling 08/21/2015  . Aneurysm, cerebral, nonruptured 02/12/2015  . Transient confusion 02/12/2015  . Altered mental status 06/05/2014  . New onset headache 06/05/2014  . Essential hypertension 06/05/2014  . Aneurysm ([place]) 06/05/2014  . Unsteady gait 04/30/2014  . Underweight 03/06/2014  . Borderline anemia 03/06/2014  . Other malaise and fatigue 01/01/2014  . Loss of weight 01/01/2014  . Stress 01/01/2014  . Medication management 10/06/2013  . History of back surgery  08/02/2013  . Radicular pain of left lower extremity 08/02/2013  . Elevated blood pressure reading 08/02/2013  . Encounter for Medicare annual wellness exam 08/02/2013  . Adjustment reaction with anxiety and depression 08/02/2013  . Hypothyroidism 08/02/2013  . Spinal stenosis, lumbar region, with neurogenic claudication 03/17/2013  . Herniated lumbar intervertebral disc 03/17/2013  . Wax in ear 07/19/2011  . Decreased hearing 07/19/2011  . TOBACCO USE, QUIT 06/24/2009  . ELEVATED BLOOD PRESSURE WITHOUT DIAGNOSIS OF HYPERTENSION 04/11/2009  . RHINITIS, CHRONIC 06/25/2008  . ACTINIC KERATOSIS, HEAD 06/25/2008  . HEARTBURN 09/12/2007  . LEG PAIN 06/27/2007  . OSTEOPOROSIS 06/27/2007  . Disturbance in sleep behavior 06/27/2007  . NUMBNESS 06/27/2007  . Edema 06/27/2007  . FASTING HYPERGLYCEMIA 06/27/2007  . HYPOTHYROIDISM 04/21/2007  . ROSACEA 04/21/2007  . Osteoarthritis 04/21/2007    Past Surgical History:  Procedure Laterality Date  . BREAST BIOPSY     bilateral benign  . BUNIONECTOMY  2003   bilateral  . IR GENERIC HISTORICAL  06/05/2016   IR ANGIO VERTEBRAL SEL VERTEBRAL BILAT MOD SED 06/05/2016 Consuella Lose, MD [facility]  . IR GENERIC HISTORICAL  06/05/2016   IR ANGIO INTRA EXTRACRAN SEL INTERNAL CAROTID BILAT MOD SED 06/05/2016 Consuella Lose, MD [facility]  . LUMBAR LAMINECTOMY/DECOMPRESSION MICRODISCECTOMY Left 03/17/2013   Procedure: LUMBAR LAMINECTOMY/DECOMPRESSION IF RECESS STENOSIS FORAMINOTOMIE OF L4 ROOT AND L5,MICRODISECTOMY  L4-5 ON LEFT;  Surgeon: Tobi Bastos, MD;  Location: [facility];  Service: Orthopedics;  Laterality: Left;  . RADIOLOGY WITH ANESTHESIA  N/A 07/03/2016   Procedure: arteriogram, pipeline embolization of aneurysm;  Surgeon: Consuella Lose, MD;  Location: [facility];  Service: Radiology;  Laterality: N/A;  . TONSILLECTOMY  1953    OB History    Gravida Para Term Preterm AB Living   3 3           SAB TAB Ectopic Multiple Live  Births                   Home Medications    Prior to Admission medications   Medication Sig Start Date End Date Taking? Authorizing Provider  amitriptyline (ELAVIL) 100 MG tablet TAKE 3 TABLETS BY MOUTH EVERY DAY 04/08/16   Burnis Medin, MD  Ascorbic Acid (VITAMIN C WITH ROSE HIPS) 1000 MG tablet Take 1,000 mg by mouth daily.    Historical Provider, MD  aspirin EC 325 MG tablet Take 10 days prior to procedure 06/15/16   Historical Provider, MD  clopidogrel (PLAVIX) 75 MG tablet Take 10 days prior to procedure 06/15/16   Historical Provider, MD  DULoxetine (CYMBALTA) 30 MG capsule Take 1 capsule (30 mg total) by mouth daily. 11/18/15   Burnis Medin, MD  gabapentin (NEURONTIN) 300 MG capsule TAKE ONE CAPSULE IN THE DAYTIME AND 2 CAPSULE AT BEDTIME 03/18/16   Burnis Medin, MD  levothyroxine (SYNTHROID) 75 MCG tablet TAKE 1 TABLET BY MOUTH DAILY BEFORE BREAKFAST. 02/17/16   Burnis Medin, MD  Multiple Vitamins-Minerals (MULTIVITAMIN WITH MINERALS) tablet Take 1 tablet by mouth daily.    Historical Provider, MD  Omega-3 Fatty Acids (FISH OIL) 1000 MG CAPS Take 1 capsule by mouth daily.    Historical Provider, MD  polyethylene glycol (MIRALAX / GLYCOLAX) packet Take 17 g by mouth daily as needed (for constipation).     Historical Provider, MD    Family History Family History  Problem Relation Age of Onset  . Pancreatic cancer Brother   . Heart disease      Social History Social History  Substance Use Topics  . Smoking status: Current Every Day Smoker    Packs/day: 0.50    Types: Cigarettes  . Smokeless tobacco: Never Used     Comment: back to tobacco 10 per day   . Alcohol use 0.0 oz/week     Allergies   Patient has no known allergies.   Review of Systems Review of Systems  Constitutional: Negative for fever.  HENT: Negative for sore throat.   Eyes: Negative for redness.  Respiratory: Negative for shortness of breath.   Cardiovascular: Negative for chest pain.    Gastrointestinal: Positive for abdominal pain. Negative for vomiting.  Genitourinary: Negative for dysuria and flank pain.  Musculoskeletal: Negative for back pain and neck pain.  Skin: Negative for rash.  Neurological: Negative for headaches.  Hematological: Does not bruise/bleed easily.  Psychiatric/Behavioral: Negative for confusion.     Physical Exam Updated Vital Signs BP 93/58   Pulse 108   Temp 98 F (36.7 C) (Oral)   Resp 17   Ht '5\' 2"'$  (1.575 m)   Wt 47.6 kg   SpO2 98%   BMI 19.20 kg/m   Physical Exam  Constitutional: She appears well-developed and well-nourished. No distress.  HENT:  Mouth/Throat: Oropharynx is clear and moist.  Eyes: Conjunctivae are normal. No scleral icterus.  Neck: Neck supple. No tracheal deviation present.  Cardiovascular: Normal rate, regular rhythm, normal heart sounds and intact distal pulses.   No murmur heard. Pulmonary/Chest: Effort normal and breath  sounds normal. No respiratory distress.  Abdominal: Soft. Normal appearance and bowel sounds are normal. She exhibits no distension and no mass. There is tenderness. There is no rebound and no guarding. No hernia.  Right abd tenderness.   Genitourinary:  Genitourinary Comments: No cva tenderness  Musculoskeletal: She exhibits no edema.  Neurological: She is alert.  Skin: Skin is warm and dry. No rash noted. She is not diaphoretic.  Psychiatric: She has a normal mood and affect.  Nursing note and vitals reviewed.    ED Treatments / Results  Labs (all labs ordered are listed, but only abnormal results are displayed) Results for orders placed or performed during the hospital encounter of 07/23/16  Lipase, blood  Result Value Ref Range   Lipase 34 11 - 51 U/L  CBC  Result Value Ref Range   WBC 17.3 (H) 4.0 - 10.5 K/uL   RBC 3.16 (L) 3.87 - 5.11 MIL/uL   Hemoglobin 9.7 (L) 12.0 - 15.0 g/dL   HCT 29.9 (L) 36.0 - 46.0 %   MCV 94.6 78.0 - 100.0 fL   MCH 30.7 26.0 - 34.0 pg   MCHC  32.4 30.0 - 36.0 g/dL   RDW 13.7 11.5 - 15.5 %   Platelets 502 (H) 150 - 400 K/uL  Comprehensive metabolic panel  Result Value Ref Range   Sodium 137 135 - 145 mmol/L   Potassium 3.4 (L) 3.5 - 5.1 mmol/L   Chloride 104 101 - 111 mmol/L   CO2 22 22 - 32 mmol/L   Glucose, Bld 137 (H) 65 - 99 mg/dL   BUN 10 6 - 20 mg/dL   Creatinine, Ser 1.34 (H) 0.44 - 1.00 mg/dL   Calcium 9.2 8.9 - 10.3 mg/dL   Total Protein 6.0 (L) 6.5 - 8.1 g/dL   Albumin 2.9 (L) 3.5 - 5.0 g/dL   AST 18 15 - 41 U/L   ALT 11 (L) 14 - 54 U/L   Alkaline Phosphatase 76 38 - 126 U/L   Total Bilirubin 0.1 (L) 0.3 - 1.2 mg/dL   GFR calc non Af Amer 40 (L) >60 mL/min   GFR calc Af Amer 46 (L) >60 mL/min   Anion gap 11 5 - 15     EKG  EKG Interpretation  Date/Time:  Thursday July 23 2016 11:33:12 EST Ventricular Rate:  100 PR Interval:    QRS Duration: 91 QT Interval:  353 QTC Calculation: 456 R Axis:   -11 Text Interpretation:  Sinus tachycardia Confirmed by Ashok Cordia  MD, Lennette Bihari ([id number]) on 07/23/2016 11:41:17 AM       Radiology No results found.  Procedures Procedures (including critical care time)  Medications Ordered in ED Medications  sodium chloride 0.9 % bolus 1,000 mL (not administered)     Initial Impression / Assessment and Plan / ED Course  I have reviewed the triage vital signs and the nursing notes.  Pertinent labs & imaging results that were available during my care of the patient were reviewed by me and considered in my medical decision making (see chart for details).  Clinical Course     Iv ns bolus. Labs. Ct.  Reviewed nursing notes and prior charts for additional history.  ua still pending - nursing to send.    Ct results pending.  Pain is improved. Recheck abd soft nt.  Pt denies cough or uri c/o. No fever or chills.    1615 ct and UA pending - signed out to Dr Tyrone Nine, recurrent right abd pain,  initial bp low, wbc elevated, check ct and UA result when resulted, recheck  pt and dispo appropriately.      Final Clinical Impressions(s) / ED Diagnoses   Final diagnoses:  None    New Prescriptions New Prescriptions   No medications on file     Lajean Saver, MD 07/23/16 1625

## 2016-07-23 NOTE — ED Provider Notes (Signed)
70 yo F with right-sided abdominal pain received in turnover from Dr. Ashok Cordia. Plan for UA and if negative discharge. UA was negative for infection. I discussed results with family. Discharge home with PCP follow-up.   Deno Etienne, DO 07/23/16 1731

## 2016-07-23 NOTE — ED Triage Notes (Signed)
Pt reports to the ED for eval of RLQ abd pain x several months. She reports she would be able to get it to resolve. Pt denies any N/V/D. Pt hypotensive at 51Z systolic. Pt has hx of TIA. Reports normal PO intake.

## 2016-07-23 NOTE — ED Notes (Signed)
Notified CT that pt has completed contrast

## 2016-07-23 NOTE — Discharge Instructions (Signed)
TAKE 8 CAPFULS OF MIRALAX IN A 32 OUNCE GATORADE AND DRINK THE WHOLE BEVERAGE.  Alternatively take 1 cap in 8 oz of water every day until you start having significant bowel movements.

## 2016-07-24 ENCOUNTER — Other Ambulatory Visit: Payer: Medicare Other

## 2016-07-24 ENCOUNTER — Encounter (HOSPITAL_COMMUNITY): Payer: Self-pay | Admitting: Neurosurgery

## 2016-07-27 ENCOUNTER — Other Ambulatory Visit: Payer: Self-pay | Admitting: Internal Medicine

## 2016-07-28 ENCOUNTER — Other Ambulatory Visit: Payer: Self-pay | Admitting: Internal Medicine

## 2016-07-28 ENCOUNTER — Telehealth: Payer: Self-pay

## 2016-07-28 NOTE — Telephone Encounter (Signed)
-----   Message from Burnis Medin, MD sent at 07/28/2016  8:14 AM EST ----- Regarding: RE: FU from ED  I agree  I cant tell if result to patient   Tell her nothing alarming  No gallstones but borderline widening of bile ducts that can be seen with gall bladder   Or bile duct problem . Also  Poss old scarring of kdney from previous infection but shouldn't cause her pain  Lots of stool like constipation   Other findings  Are not acutely significant   Want  To see her after GI evaluation  Thanks for your help.  WP ----- Message ----- From: Beckie Busing, CMA Sent: 07/27/2016   8:20 AM To: Burnis Medin, MD Subject: RE: FU from ED                                 Do you know if her CT was resulted? I don't see any result notes for it. I'm just thinking that once I call her she may ask me for the results.  Thanks! [name]   ----- Message ----- From: Burnis Medin, MD Sent: 07/27/2016   6:00 AM To: Beckie Busing, CMA Subject: FU from ED                                     [name]  I noted that she  was seen in ed  11 30 for abd pain  She has been having this   For a while  Could you call her and see how she is doing ?  If she agrees would get a Gi consult   ( she has appt with me in JAnuary )  I will be out of the office for dec 10- 21 . trying to anticipate needs  Thanks [facility]

## 2016-07-28 NOTE — Telephone Encounter (Signed)
LMTCB

## 2016-07-28 NOTE — Telephone Encounter (Signed)
Sent to the pharmacy by e-scribe for 3 months per [facility].

## 2016-07-28 NOTE — Telephone Encounter (Signed)
Because of your recent abdominal pain and procedure would not refill the motor vehicle right now. Need approval from your specialists and make sure it is not contributing to your abdominal pain. When you're on aspirin and mobic  you have a higher risk of bleeding and getting a stomach ulcer.

## 2016-07-29 ENCOUNTER — Encounter: Payer: Self-pay | Admitting: *Deleted

## 2016-07-29 ENCOUNTER — Other Ambulatory Visit: Payer: Self-pay | Admitting: Internal Medicine

## 2016-07-29 ENCOUNTER — Ambulatory Visit: Payer: Medicare Other | Attending: Neurosurgery | Admitting: *Deleted

## 2016-07-29 ENCOUNTER — Ambulatory Visit: Payer: Medicare Other | Admitting: *Deleted

## 2016-07-29 DIAGNOSIS — R2689 Other abnormalities of gait and mobility: Secondary | ICD-10-CM | POA: Insufficient documentation

## 2016-07-29 DIAGNOSIS — R278 Other lack of coordination: Secondary | ICD-10-CM | POA: Diagnosis not present

## 2016-07-29 DIAGNOSIS — M6281 Muscle weakness (generalized): Secondary | ICD-10-CM | POA: Insufficient documentation

## 2016-07-29 DIAGNOSIS — I69818 Other symptoms and signs involving cognitive functions following other cerebrovascular disease: Secondary | ICD-10-CM | POA: Insufficient documentation

## 2016-07-29 NOTE — Therapy (Signed)
[facility] [address] [place], [place], [address] Phone: [phone]   Fax:  [phone]  Occupational Therapy Evaluation  Patient Details  Name: [name] MRN: [id number] Date of Birth: [birth year] Referring Provider: Dr. Consuella Lose  Encounter Date: 07/29/2016      OT End of Session - 07/29/16 1231    Visit Number 1   Number of Visits 16  G Code every 10th visit   Date for OT Re-Evaluation 08/26/16   Authorization Type [employer] - G code every 10th visit   Authorization - Visit Number 1   Authorization - Number of Visits 10   OT Start Time 0756   OT Stop Time 0848   OT Time Calculation (min) 52 min   Activity Tolerance Patient tolerated treatment well   Behavior During Therapy Impulsive      Past Medical History:  Diagnosis Date  . Anxiety   . Depression   . GERD (gastroesophageal reflux disease)    WOULD USE OTC MEDICATION IF NEEDED  . Hypothyroidism   . 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 Rosacea   . Tachycardia    PT STATES HER HEART RATE USUALLY ABOVE 100  . TIA (transient ischemic attack)     Past Surgical History:  Procedure Laterality Date  . BREAST BIOPSY     bilateral benign  . BUNIONECTOMY  2003   bilateral  . IR GENERIC HISTORICAL  06/05/2016   IR ANGIO VERTEBRAL SEL VERTEBRAL BILAT MOD SED 06/05/2016 Consuella Lose, MD [facility]  . IR GENERIC HISTORICAL  06/05/2016   IR ANGIO INTRA EXTRACRAN SEL INTERNAL CAROTID BILAT MOD SED 06/05/2016 Consuella Lose, MD [facility]  . IR GENERIC HISTORICAL  07/03/2016   IR ANGIOGRAM FOLLOW UP STUDY 07/03/2016 Consuella Lose, MD [facility]  . IR GENERIC HISTORICAL  07/03/2016   IR TRANSCATH/EMBOLIZ 07/03/2016 Consuella Lose, MD [facility]  . LUMBAR LAMINECTOMY/DECOMPRESSION MICRODISCECTOMY Left 03/17/2013   Procedure: LUMBAR LAMINECTOMY/DECOMPRESSION IF RECESS  STENOSIS FORAMINOTOMIE OF L4 ROOT AND L5,MICRODISECTOMY  L4-5 ON LEFT;  Surgeon: Tobi Bastos, MD;  Location: [facility];  Service: Orthopedics;  Laterality: Left;  . RADIOLOGY WITH ANESTHESIA N/A 07/03/2016   Procedure: arteriogram, pipeline embolization of aneurysm;  Surgeon: Consuella Lose, MD;  Location: [facility];  Service: Radiology;  Laterality: N/A;  . TONSILLECTOMY  1953    There were no vitals filed for this visit.      Subjective Assessment - 07/29/16 1310    Subjective  Pt is a 70 y/o female s/p cerebral aneurysm embolization on 07/03/16 and was d/c 07/04/16. Pt went home following day had right sided weakness and was dragging her R LE per her caregiver report. She has decreased balance and has had some falls as well. Her Neurologist referred her to OT/PT our-pt Rehab.   Patient is accompained by: Family member  Gwyndolyn Saxon Husband   Pertinent History See EPIC for full history; Currently "with another aneurysm that is growing & needs to be removed" surgically.; No driving, 02-40 falls in last 6 months   Currently in Pain? Yes   Pain Score 3    Pain Location Abdomen   Pain Orientation Right;Lower   Pain Descriptors / Indicators Sharp   Pain Type Other (Comment)  Off and on, was seen in ER, dx w/ bowel blockage & given medications   Pain Onset More than a month ago   Pain Frequency Occasional  Multiple Pain Sites No           [facility] OT Assessment - 07/29/16 0001      Assessment   Diagnosis Cerebral Aneurysm Embolization   Referring Provider Dr. Consuella Lose   Onset Date 07/03/16   Assessment Was hospitialized 11/10-11/11/17 & d/c home.      Precautions   Precautions None;Fall;Other (comment)  s/p aneurysm embolization, has another growing and needs Sx   Precaution Comments Pt/spouse report additional aneurysm growing that she needs to have surgically removed      Restrictions   Weight Bearing Restrictions No     Balance Screen   Has the patient fallen in the  past 6 months Yes   How many times? 20-30 times in last 6 months   Has the patient had a decrease in activity level because of a fear of falling?  Yes   Is the patient reluctant to leave their home because of a fear of falling?  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   Leisure TV      ADL   Eating/Feeding Independent   Grooming Modified independent  Spouse reports that she needs supervision for safety; coord    Lower Body Bathing Supervision/safety  Pt bathes I'ly however doesn't use stool & not safe 2* falls   Upper Body Dressing Increased time;Supervision/safety  Min A today in clinic to don/doff jacket.   Lower Body Dressing Modified independent;Increased time  Decreased safety; would benefit from supervision/PRN A   Toilet Tranfer Modified independent   Toileting - Clothing Manipulation Modified independent;Increased time   Toileting -  Hygiene Modified Independent;Increase time   Tub/Shower Transfer Supervision/safety  Pt/spouse report that pt bathes when husband is not home   ADL comments Per pt h/o falls prior to and after recent surgery, pt should be supervision at all times for safety. Per pt and her husband, she is alone at home as he works 3-11pm shift as a Quarry manager at [employer] Pt also appears to have decreased safety awareness and decreased awareness of deficits. Pt spouse  reports that pt has h/o falls and that they stopped taking plavix due to possible side effects. They have left messages for her MD/surgeon and have "not heard back even with multiple calls and messages being left"     IADL   Prior Level of Function Light Housekeeping Pt reports functional ADL's and homemaking tasks as WFL's with increased time per her report. She intitially required assist for first 3 weeks following surgery.  Pt does not drive; relies on husband/family    Light Housekeeping --  Pt husband assists PRN    Meal Prep --  Pt does simple meal/snack prep, husband Assists PRN     Mobility   Mobility Status History of falls   Mobility Status  Comments Pt/spouse report 20-30 falls in last 6 months. Pt has SW, canes, shower stool, but does not use and has not had prior therapy for functional mobility per her report.     Written Expression   Dominant Hand Right     Vision - History   Baseline Vision Wears glasses only for reading     Cognition   Overall Cognitive Status Impaired/Different from baseline  Has difficulty finding words at times; h/o cog issues prior    Memory Impaired   Memory Impairment Storage deficit;Retrieval deficit;Decreased recall of new information;Decreased short term memory   Decreased Short Term Memory Verbal basic;Functional basic   Awareness Impaired   Awareness Impairment Other (comment)  Decreaesd safety awareness/awareness of deficits   Problem Solving Impaired   Executive Function Decision Making;Initiating;Self Monitoring;Self Correcting     Observation/Other Assessments   Other Surveys  Select   Outcome Measures ( hole peg test; Box and blocks     Sensation   Light Touch Appears Intact;Impaired Detail  Pt intact to light touch, reports numbness at times RUE     Coordination   Gross Motor Movements are Fluid and Coordinated Yes  RUE   Fine Motor Movements are Fluid and Coordinated No  Noted h/o arthritis in iblateral hands   9 Hole Peg  Test Right;Left   Right 9 Hole Peg Test 112.32 sec   Left 9 Hole Peg Test 55.19 sec   Box and Blocks R = 30; L = 29   Coordination Impaired  Difficulty following 1 step directions, mult stop/start req.     ROM / Strength   AROM / PROM / Strength AROM;Strength     AROM   Overall AROM  Within functional limits for tasks performed   Overall AROM Comments Gross AROM of bilateral UE's WFL's, Increased time and fatigues easily     Strength   Overall Strength Deficits   Overall Strength Comments LUE = WFL's/3+/5; RUE = Weak, fatigued/3-/5 gross MMT     Hand Function   Right Hand Grip (lbs) 15   Left Hand Grip (lbs) 20   Comment Pt fatigues easily and hikes her shoulers during grip assessment noted.     Functional Reaching Activities   Low Level WFL's   Mid Level WFL's   High Level Impaired                         OT Education - 07/29/16 1229    Education provided Yes   Education Details Role of OT; Eval findings and recommendations, POC. Recommend pt has supervision/assist PRN 24/7 (note spouse works evenings 3-11pm shift).   Person(s) Educated Patient;Spouse   Methods Explanation   Comprehension Verbalized understanding          OT Short Term Goals - 07/29/16 1259      OT SHORT TERM GOAL #1   Title Pt wil lbe Min A home program for memory strategies   Time 4   Period Weeks   Status New     OT SHORT TERM GOAL #2   Title Pt will be Mod I home program for FM/coordination ex's bilateral UE's    Time 4   Period Weeks   Status New     OT SHORT TERM GOAL #3   Title Pt will be Min vc's for fall prevention techniques during ADL's as seen by ability to state 2-3 techniques/strategies.   Time 4   Period Weeks   Status New  OT SHORT TERM GOAL #4   Title Pt will be Mod I w/ 2-3 DME, and a/e options to assist with increased ADL independence   Time 4   Period Weeks   Status New           OT Long Term Goals - 07/29/16 1302      OT LONG TERM GOAL  #1   Title Pt will I'ly state 2-3 memory strategies to assist with safety    Time 8   Period Weeks   Status New     OT LONG TERM GOAL #2   Title Pt will be supervision with simulated tub transfers using DME & A/e PRN   Time 8   Period Weeks   Status New     OT LONG TERM GOAL #3   Title Pt will be supervision level for simple snack prep in ADL kitchen using necessary DME and maintinaing safety   Time 8   Period Weeks   Status New     OT LONG TERM GOAL #4   Title Pt will demonstrate increased strength as seen by RUE gross MMT of 3/5 throughout   Time 8   Period Weeks   Status New     OT LONG TERM GOAL #5   Title Pt will demonstrate improved FM/coordination as seen by improved 9 hole peg test score by 25 seconds or more on the right and 10 seconds or more on the left.   Baseline 07/29/16 = R = 112.32 sec, L = 55.19 sec   Time 8   Period Weeks   Status New               Plan - 07/29/16 1232    Clinical Impression Statement Pt is a 70 y/o RHD female s/p uncomplicated pipeline embolization of LICA aneurysms on 51/02/58. She was d/c'd home on 07/04/16 and her husband states that the following day, she had noted right sided weakness, decreased balance and strength. He states that she required assistance for all ADL's and functional activities "for about the first 3 weeks". She has a complicated medical history (please see EPIC for full details), to include cognitive deficits at baseline, that are more pronounced now (word finding difficulty, difficulty following 1 & 2 step commands, decreased safety awareness and awareness of deficits). She has a h/o falls at home with an estimaed 20-30 falls in last 6 months per her husbands report. She does not currently drive, has a h/o arthritis and ongoing right lower quadrant abdominal pain "that comes and goes". Pt reports that the most recent occurance was last week when she went to the ER, had a CT of her abdomen and was told that she was  "backed up with bowel" She was d/c home & instructed to take metamucil twice a day. She is noted to have a head tilt to the right at rest in sitting and during ambulation. She does not currently use any DME or A/E but has a shower stool, SW and canes. She also states that she has "another aneurysm growing that needs to be removed" surgically, she denies any current precautions related to this. Pt should benefit from out-pt Neuro rehab to address deficits with strength, FM/coordinaiton, DME, A/e recommendations, cognition and pt/family education that currently impacts ADL's and IADL's. Pt/spouse were also educated today that pt should not be left home alone secondary to falls & safety reasons.    Rehab Potential Fair   Clinical Impairments Affecting Rehab Potential H/o  20-30 falls in last 6 months; Unknown/Unclear baseline cognitive level; has some DME, does not use; stays home alone wile husband works in evenings   OT Frequency 2x / week   OT Duration 8 weeks   OT Treatment/Interventions Self-care/ADL training;DME and/or AE instruction;Patient/family education;Balance training;Therapeutic exercises;Therapeutic exercise;Therapeutic activities;Cognitive remediation/compensation;Functional Mobility Training;Neuromuscular education;Energy conservation   Plan Home program for FM/coordination bilateral UE's (R > L); Memory strategies   Recommended Other Services F/u w/ Neuro surgeon/MD re: medications (pt not taking Plavix - self d/c), precautions etc.   Consulted and Agree with Plan of Care Patient      Patient will benefit from skilled therapeutic intervention in order to improve the following deficits and impairments:  Decreased coordination, Impaired sensation, Difficulty walking, Decreased safety awareness, Decreased endurance, Decreased activity tolerance, Decreased knowledge of precautions, Pain, Impaired UE functional use, Decreased knowledge of use of DME, Decreased balance, Decreased cognition,  Decreased mobility, Decreased strength, Impaired perceived functional ability  Visit Diagnosis: Muscle weakness (generalized) - Plan: Ot plan of care cert/re-cert  Other lack of coordination - Plan: Ot plan of care cert/re-cert  Other symptoms and signs involving cognitive functions following other cerebrovascular disease - Plan: Ot plan of care cert/re-cert      G-Codes - 20/94/70 1308    Functional Assessment Tool Used Clinical judgement; 9 hole peg test (R= 112.32 sec, L = 55.19 sec); Box & Blocks test (R= 30, L=29)   Functional Limitation Carrying, moving and handling objects   Carrying, Moving and Handling Objects Current Status [phone]) At least 60 percent but less than 80 percent impaired, limited or restricted   Carrying, Moving and Handling Objects Goal Status ([id number]) At least 20 percent but less than 40 percent impaired, limited or restricted      Problem List Patient Active Problem List   Diagnosis Date Noted  . Cerebral aneurysm, nonruptured 07/03/2016  . Right lower quadrant abdominal pain 05/20/2016  . Cervical spine disease 05/20/2016  . Frequent falls 05/05/2016  . Palpable abdominal aorta 10/11/2015  . Orthostatic hypotension 10/11/2015  . Hx of falling 08/21/2015  . Aneurysm, cerebral, nonruptured 02/12/2015  . Transient confusion 02/12/2015  . Altered mental status 06/05/2014  . New onset headache 06/05/2014  . Essential hypertension 06/05/2014  . Aneurysm ([place]) 06/05/2014  . Unsteady gait 04/30/2014  . Underweight 03/06/2014  . Borderline anemia 03/06/2014  . Other malaise and fatigue 01/01/2014  . Loss of weight 01/01/2014  . Stress 01/01/2014  . Medication management 10/06/2013  . History of back surgery 08/02/2013  . Radicular pain of left lower extremity 08/02/2013  . Elevated blood pressure reading 08/02/2013  . Encounter for Medicare annual wellness exam 08/02/2013  . Adjustment reaction with anxiety and depression 08/02/2013  . Hypothyroidism  08/02/2013  . Spinal stenosis, lumbar region, with neurogenic claudication 03/17/2013  . Herniated lumbar intervertebral disc 03/17/2013  . Wax in ear 07/19/2011  . Decreased hearing 07/19/2011  . TOBACCO USE, QUIT 06/24/2009  . ELEVATED BLOOD PRESSURE WITHOUT DIAGNOSIS OF HYPERTENSION 04/11/2009  . RHINITIS, CHRONIC 06/25/2008  . ACTINIC KERATOSIS, HEAD 06/25/2008  . HEARTBURN 09/12/2007  . LEG PAIN 06/27/2007  . OSTEOPOROSIS 06/27/2007  . Disturbance in sleep behavior 06/27/2007  . NUMBNESS 06/27/2007  . Edema 06/27/2007  . FASTING HYPERGLYCEMIA 06/27/2007  . HYPOTHYROIDISM 04/21/2007  . ROSACEA 04/21/2007  . Osteoarthritis 04/21/2007    Percell Miller Beth Dixon, OTR/L 07/29/2016, 1:16 PM  [facility] [address] [place], [place], [address] Phone: [phone]  Fax:  [phone]  Name: [name] MRN: [id number] Date of Birth: Apr 14, 1946

## 2016-07-29 NOTE — Therapy (Signed)
[facility] [address] [place], [place], [address] Phone: [phone]   Fax:  [phone]  Physical Therapy Evaluation  Patient Details  Name: [name] MRN: [id number] Date of Birth: [birth year] Referring Provider: Kathyrn Sheriff  Encounter Date: 07/29/2016      PT End of Session - 07/29/16 1003    Visit Number 1   Number of Visits 17   Date for PT Re-Evaluation 10/02/16   Authorization Type [employer] Medicare- G-code every 10 visits   PT Start Time [phone]   PT Stop Time 0930   PT Time Calculation (min) 44 min   Activity Tolerance Patient tolerated treatment well   Behavior During Therapy Impulsive      Past Medical History:  Diagnosis Date  . Anxiety   . Depression   . GERD (gastroesophageal reflux disease)    WOULD USE OTC MEDICATION IF NEEDED  . Hypothyroidism   . 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 Rosacea   . Tachycardia    PT STATES HER HEART RATE USUALLY ABOVE 100  . TIA (transient ischemic attack)     Past Surgical History:  Procedure Laterality Date  . BREAST BIOPSY     bilateral benign  . BUNIONECTOMY  2003   bilateral  . IR GENERIC HISTORICAL  06/05/2016   IR ANGIO VERTEBRAL SEL VERTEBRAL BILAT MOD SED 06/05/2016 Consuella Lose, MD [facility]  . IR GENERIC HISTORICAL  06/05/2016   IR ANGIO INTRA EXTRACRAN SEL INTERNAL CAROTID BILAT MOD SED 06/05/2016 Consuella Lose, MD [facility]  . IR GENERIC HISTORICAL  07/03/2016   IR ANGIOGRAM FOLLOW UP STUDY 07/03/2016 Consuella Lose, MD [facility]  . IR GENERIC HISTORICAL  07/03/2016   IR TRANSCATH/EMBOLIZ 07/03/2016 Consuella Lose, MD [facility]  . LUMBAR LAMINECTOMY/DECOMPRESSION MICRODISCECTOMY Left 03/17/2013   Procedure: LUMBAR LAMINECTOMY/DECOMPRESSION IF RECESS STENOSIS FORAMINOTOMIE OF L4 ROOT AND L5,MICRODISECTOMY  L4-5 ON LEFT;  Surgeon: Tobi Bastos, MD;  Location: [facility];  Service: Orthopedics;   Laterality: Left;  . RADIOLOGY WITH ANESTHESIA N/A 07/03/2016   Procedure: arteriogram, pipeline embolization of aneurysm;  Surgeon: Consuella Lose, MD;  Location: [facility];  Service: Radiology;  Laterality: N/A;  . TONSILLECTOMY  1953    There were no vitals filed for this visit.       Subjective Assessment - 07/29/16 0849    Subjective Underwent aneurysm 07/03/16, went home and was fine, then later noted R sided weakness day she went home.  Had increased falls as well.  Then noted big improvement after stopping Plavix.  Upon return to MD said either brain bleed or stroke in Basal Ganglia.   Falls are more of a drop.  Used to be able to go into squat position for back pain relief, but not unable to recover independently from that position.    Patient is accompained by: Family member   Pertinent History spouse- Bill   Limitations Walking   Currently in Pain? Yes   Pain Score 3    Pain Location Abdomen   Pain Orientation Right;Lower   Pain Type Other (Comment)  off and on was seen in ER and dx with bowel blockage and given medications   Pain Onset More than a month ago   Pain Frequency Occasional            [facility] PT Assessment - 07/29/16 0856      Assessment   Medical Diagnosis cerebral aneurysm   Referring Provider Kathyrn Sheriff  Onset Date/Surgical Date 07/03/16  will reschedule   Hand Dominance Right   Next MD Visit will reschedule   Prior Therapy none     Precautions   Precautions Fall     Restrictions   Weight Bearing Restrictions No     Balance Screen   Has the patient fallen in the past 6 months Yes   How many times? 20   Has the patient had a decrease in activity level because of a fear of falling?  Yes   Is the patient reluctant to leave their home because of a fear of falling?  No     Home Environment   Living Environment Private residence   Living Arrangements Spouse/significant other   Available Help at Discharge Family;Available 24 hours/day   Type of  Home House   Home Access Stairs to enter   Entrance Stairs-Number of Steps 6   Entrance Stairs-Rails Right;Left;Cannot reach both   Home Layout One level   [place] - 2 wheels     Prior Function   Level of Independence Independent with gait;Needs assistance with homemaking     Cognition   Memory Impaired   Memory Impairment Storage deficit;Retrieval deficit;Decreased recall of new information;Decreased short term memory   Decreased Short Term Memory Verbal basic;Functional basic   Awareness Impaired   Awareness Impairment Other (comment)  decreased safety awareness, decreased awareness of deficits   Problem Solving Impaired   Executive Function Decision Making;Self Monitoring;Self Correcting     Observation/Other Assessments   Observations R lateral shift in posture with R shoulder lower and lateral head tilt     Sensation   Light Touch Appears Intact     Coordination   Gross Motor Movements are Fluid and Coordinated No   Heel Shin Test decreased coordination bilaterally esp with L on R shin     ROM / Strength   AROM / PROM / Strength AROM;Strength     AROM   Overall AROM  Within functional limits for tasks performed     Strength   Overall Strength Deficits   Overall Strength Comments Hip flexion R 4-/5, L 4/5; hip abduction 4/5 bilateral; knee extension R 4/5, L 4+/5, ankle DF 5/5 bilat;      Bed Mobility   Bed Mobility Rolling Right;Rolling Left;Supine to Sit;Sit to Supine   Rolling Right 7: Independent   Rolling Left 7: Independent   Supine to Sit 4: Min assist   Supine to Sit Details (indicate cue type and reason) rolling to get up from middle of mat R foot caught under her and needed assist to get it out   Sit to Supine 7: Independent     Transfers   Transfers Sit to Stand;Stand to Sit   Sit to Stand 6: Modified independent (Device/Increase time)   Stand to Sit 5: Supervision   Stand to Sit Details for safety due to tends to sit on edge of seat  and not reaching with hands     Ambulation/Gait   Ambulation/Gait Yes   Ambulation/Gait Assistance 4: Min guard   Ambulation Distance (Feet) 200 Feet   Assistive device None   Gait Pattern Step-through pattern;Lateral trunk lean to right;Narrow base of support;Scissoring;Right flexed knee in stance   Ambulation Surface Level;Indoor   Gait velocity 10 MWT: 11.25 sec; 2.91 ft/sec   Stairs Yes   Stairs Assistance 4: Min assist   Stairs Assistance Details (indicate cue type and reason) assist for balance, safety, cues for using rails  and slowing down with ascent (almost missed second step stepping close to edge and not holding rails)   Stair Management Technique No rails;One rail Right;Two rails;Alternating pattern;Forwards   Number of Stairs 4  x 2   Height of Stairs 6     Balance   Balance Assessed Yes     Standardized Balance Assessment   Standardized Balance Assessment Berg Balance Test;Timed Up and Go Test;Five Times Sit to Stand   Five times sit to stand comments  13.7     Berg Balance Test   Sit to Stand Able to stand without using hands and stabilize independently   Standing Unsupported Able to stand safely 2 minutes   Sitting with Back Unsupported but Feet Supported on Floor or Stool Able to sit safely and securely 2 minutes   Stand to Sit Sits safely with minimal use of hands   Transfers Able to transfer safely, definite need of hands   Standing Unsupported with Eyes Closed Able to stand 10 seconds with supervision   Standing Ubsupported with Feet Together Able to place feet together independently and stand for 1 minute with supervision   From Standing, Reach Forward with Outstretched Arm Can reach confidently >25 cm (10")   From Standing Position, Pick up Object from [place] to pick up shoe safely and easily   From Standing Position, Turn to Look Behind Over each Shoulder Looks behind from both sides and weight shifts well   Turn 360 Degrees Able to turn 360 degrees  safely one side only in 4 seconds or less   Standing Unsupported, Alternately Place Feet on Step/Stool Able to complete 4 steps without aid or supervision   Standing Unsupported, One Foot in Front Able to plae foot ahead of the other independently and hold 30 seconds   Standing on One Leg Able to lift leg independently and hold equal to or more than 3 seconds   Total Score 47     Timed Up and Go Test   Normal TUG (seconds) 12.2   Manual TUG (seconds) 15   Cognitive TUG (seconds) 17.5  couldn't count backwards                           PT Education - 07/29/16 1002    Education provided Yes   Education Details PT POC, need for assist in evenings when spouse at work   [employer]) Educated Patient;Spouse   Methods Explanation   Comprehension Verbalized understanding          PT Short Term Goals - 07/29/16 1008      PT SHORT TERM GOAL #1   Title Patient to demonstrate decreased fall risk with Berg score 50/56.   Baseline Target 08/28/16   Time 4   Period Weeks   Status New     PT SHORT TERM GOAL #2   Title Patient to ambulate with LRAD supervision x 300' level indoor tile/carpet.   Time 4   Period Weeks   Status New     PT SHORT TERM GOAL #3   Title Patient to perform HEP for balance and LE strength with supervision.   Time 4   Period Weeks   Status New     PT SHORT TERM GOAL #4   Title Patient to negotiate stairs with S with rail use 100% of the time.    Time 4   Period Weeks   Status New  PT Long Term Goals - 2016/08/09 1014      PT LONG TERM GOAL #1   Title Patient will demonstrate decreased fall risk with Berg score 52/56   Baseline Target 10/02/16   Time 8   Period Weeks     PT LONG TERM GOAL #2   Title Patient will ambulate I level indoor surfaces 300'.   Time 8   Period Weeks   Status New     PT LONG TERM GOAL #3   Title Patient will demonstrate decreased fall risk with TUG manual in 13 seconds or less.   Time 8   Period  Weeks   Status New     PT LONG TERM GOAL #4   Title Patient will demonstrate fall recovery with S and verbalize safe techniques for injury prevention.    Time 8   Period Weeks   Status New     PT LONG TERM GOAL #5   Title Patient will negotiate 4 stairs mod I.   Time 8   Period Weeks   Status New               Plan - August 09, 2016 1004    Clinical Impression Statement Patient presents with decreased independence with mobility and decreased safety awareness due to strength, balance and cognitive deficits.  She will benefit from outpatient skilled PT to address issues and maximize safety and independence.  Patient reluctant to allow family to assist due to their busy lives, but currently not safe for being alone especially in light of 20 falls in past 6 months.     Rehab Potential Good   PT Frequency 2x / week   PT Duration 8 weeks   PT Treatment/Interventions Therapeutic activities;Moist Heat;Therapeutic exercise;Balance training;Neuromuscular re-education;Cognitive remediation;Gait training;Stair training;Functional mobility training;Patient/family education;Vestibular;ADLs/Self Care Home Management   PT Next Visit Plan initiate HEP for balance, LE and core strength   Consulted and Agree with Plan of Care Patient;Family member/caregiver   Family Member Consulted spouse      Patient will benefit from skilled therapeutic intervention in order to improve the following deficits and impairments:  Abnormal gait, Decreased safety awareness, Pain, Decreased knowledge of precautions, Decreased activity tolerance, Decreased balance, Decreased cognition, Decreased mobility, Decreased strength, Postural dysfunction, Decreased knowledge of use of DME  Visit Diagnosis: Muscle weakness (generalized)  Other abnormalities of gait and mobility  Other lack of coordination      G-Codes - 2016-08-09 1342    Functional Assessment Tool Used Berg score 47/56, TUG 12.2; TUG cognitive 17.5; 20 falls  in 6 months   Functional Limitation Mobility: Walking and moving around   Mobility: Walking and Moving Around Current Status [phone]) At least 20 percent but less than 40 percent impaired, limited or restricted   Mobility: Walking and Moving Around Goal Status [phone]) At least 1 percent but less than 20 percent impaired, limited or restricted       Problem List Patient Active Problem List   Diagnosis Date Noted  . Cerebral aneurysm, nonruptured 07/03/2016  . Right lower quadrant abdominal pain 05/20/2016  . Cervical spine disease 05/20/2016  . Frequent falls 05/05/2016  . Palpable abdominal aorta 10/11/2015  . Orthostatic hypotension 10/11/2015  . Hx of falling 08/21/2015  . Aneurysm, cerebral, nonruptured 02/12/2015  . Transient confusion 02/12/2015  . Altered mental status 06/05/2014  . New onset headache 06/05/2014  . Essential hypertension 06/05/2014  . Aneurysm ([place]) 06/05/2014  . Unsteady gait 04/30/2014  . Underweight 03/06/2014  . Borderline  anemia 03/06/2014  . Other malaise and fatigue 01/01/2014  . Loss of weight 01/01/2014  . Stress 01/01/2014  . Medication management 10/06/2013  . History of back surgery 08/02/2013  . Radicular pain of left lower extremity 08/02/2013  . Elevated blood pressure reading 08/02/2013  . Encounter for Medicare annual wellness exam 08/02/2013  . Adjustment reaction with anxiety and depression 08/02/2013  . Hypothyroidism 08/02/2013  . Spinal stenosis, lumbar region, with neurogenic claudication 03/17/2013  . Herniated lumbar intervertebral disc 03/17/2013  . Wax in ear 07/19/2011  . Decreased hearing 07/19/2011  . TOBACCO USE, QUIT 06/24/2009  . ELEVATED BLOOD PRESSURE WITHOUT DIAGNOSIS OF HYPERTENSION 04/11/2009  . RHINITIS, CHRONIC 06/25/2008  . ACTINIC KERATOSIS, HEAD 06/25/2008  . HEARTBURN 09/12/2007  . LEG PAIN 06/27/2007  . OSTEOPOROSIS 06/27/2007  . Disturbance in sleep behavior 06/27/2007  . NUMBNESS 06/27/2007  . Edema  06/27/2007  . FASTING HYPERGLYCEMIA 06/27/2007  . HYPOTHYROIDISM 04/21/2007  . ROSACEA 04/21/2007  . Osteoarthritis 04/21/2007    Reginia Naas 07/29/2016, 1:45 PM  Magda Kiel, [place] 07/29/2016   [facility] [address] [place], [place], [address] Phone: [phone]   Fax:  [phone]  Name: BRANDICE BUSSER MRN: [id number] Date of Birth: 1945-09-01

## 2016-07-29 NOTE — Patient Instructions (Signed)
Educated spouse to make sure pt with assist in evening while he is at work especially on days she has therapy due to will have more fatigue.  Also explained POC and scheduling.

## 2016-07-30 NOTE — Telephone Encounter (Signed)
Left a message for a return call.

## 2016-07-30 NOTE — Telephone Encounter (Signed)
LMTCB

## 2016-07-31 NOTE — Telephone Encounter (Signed)
Received request.  Duplicate message for gabapentin.  Removed request.

## 2016-08-01 ENCOUNTER — Other Ambulatory Visit: Payer: Self-pay | Admitting: Internal Medicine

## 2016-08-03 NOTE — Telephone Encounter (Signed)
Sent to the pharmacy by e-scribe for 30 days.  Pt has upcoming appt on 08/25/16.

## 2016-08-04 NOTE — Telephone Encounter (Signed)
Unable to reach pt. Message closed per protocol.

## 2016-08-04 NOTE — Telephone Encounter (Signed)
Left a message for a return call.

## 2016-08-06 ENCOUNTER — Telehealth: Payer: Self-pay | Admitting: Occupational Therapy

## 2016-08-06 ENCOUNTER — Encounter: Payer: Self-pay | Admitting: Physical Therapy

## 2016-08-06 ENCOUNTER — Ambulatory Visit: Payer: Medicare Other | Admitting: Physical Therapy

## 2016-08-06 ENCOUNTER — Ambulatory Visit: Payer: Medicare Other | Admitting: Occupational Therapy

## 2016-08-06 VITALS — BP 99/69

## 2016-08-06 DIAGNOSIS — I69818 Other symptoms and signs involving cognitive functions following other cerebrovascular disease: Secondary | ICD-10-CM

## 2016-08-06 DIAGNOSIS — M6281 Muscle weakness (generalized): Secondary | ICD-10-CM

## 2016-08-06 DIAGNOSIS — R278 Other lack of coordination: Secondary | ICD-10-CM

## 2016-08-06 DIAGNOSIS — R2689 Other abnormalities of gait and mobility: Secondary | ICD-10-CM

## 2016-08-06 NOTE — Telephone Encounter (Signed)
Left a message for a return call.  Have tried to reach the pt 3 times.  Will now deny medication and close note.

## 2016-08-06 NOTE — Telephone Encounter (Signed)
Dr. Kathyrn Sheriff, [name] is receiving PT/OT at our site. She demonstrates cognitive deficits and word finding difficulties. She benefit from speech therapy. If you agree please write an order for speech therapy eval and treatment. Sincerely, Theone Murdoch, OTR/L

## 2016-08-06 NOTE — Therapy (Signed)
[facility] [address] [place], [place], [address] Phone: [phone]   Fax:  [phone]  Occupational Therapy Treatment  Patient Details  Name: [name] MRN: [id number] Date of Birth: Jul 25, 1946 Referring Provider: Dr. Consuella Lose  Encounter Date: 08/06/2016      OT End of Session - 08/06/16 1308    Visit Number 2   Number of Visits 16   Date for OT Re-Evaluation 08/26/16   Authorization Type [employer] - G code every 10th visit   Authorization - Visit Number 2   Authorization - Number of Visits 10   OT Start Time 1020   OT Stop Time 1100   OT Time Calculation (min) 40 min      Past Medical History:  Diagnosis Date  . Anxiety   . Depression   . GERD (gastroesophageal reflux disease)    WOULD USE OTC MEDICATION IF NEEDED  . Hypothyroidism   . 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 Rosacea   . Tachycardia    PT STATES HER HEART RATE USUALLY ABOVE 100  . TIA (transient ischemic attack)     Past Surgical History:  Procedure Laterality Date  . BREAST BIOPSY     bilateral benign  . BUNIONECTOMY  2003   bilateral  . IR GENERIC HISTORICAL  06/05/2016   IR ANGIO VERTEBRAL SEL VERTEBRAL BILAT MOD SED 06/05/2016 Consuella Lose, MD [facility]  . IR GENERIC HISTORICAL  06/05/2016   IR ANGIO INTRA EXTRACRAN SEL INTERNAL CAROTID BILAT MOD SED 06/05/2016 Consuella Lose, MD [facility]  . IR GENERIC HISTORICAL  07/03/2016   IR ANGIOGRAM FOLLOW UP STUDY 07/03/2016 Consuella Lose, MD [facility]  . IR GENERIC HISTORICAL  07/03/2016   IR TRANSCATH/EMBOLIZ 07/03/2016 Consuella Lose, MD [facility]  . LUMBAR LAMINECTOMY/DECOMPRESSION MICRODISCECTOMY Left 03/17/2013   Procedure: LUMBAR LAMINECTOMY/DECOMPRESSION IF RECESS STENOSIS FORAMINOTOMIE OF L4 ROOT AND L5,MICRODISECTOMY  L4-5 ON LEFT;  Surgeon: Tobi Bastos, MD;  Location: [facility];   Service: Orthopedics;  Laterality: Left;  . RADIOLOGY WITH ANESTHESIA N/A 07/03/2016   Procedure: arteriogram, pipeline embolization of aneurysm;  Surgeon: Consuella Lose, MD;  Location: [facility];  Service: Radiology;  Laterality: N/A;  . TONSILLECTOMY  1953    There were no vitals filed for this visit.      Subjective Assessment - 08/06/16 1022    Subjective  no pain currently, sometimes pain from abdominal blockage   Pertinent History See EPIC for full history; Currently "with another aneurysm that is growing & needs to be removed" surgically.; No driving, 70-62 falls in last 6 months   Currently in Pain? No/denies              Treatment: Pt/ husband were educated in basic coordination exercises for bilateral UE's . Pt required mod/ max v.c.to stay on task and for performance. Pt was easily distracted by other patients, requiring v.c. to focus on tasks. Discussion with pt and husband regarding home safety and recommendation that pt is not left alone in the evenings when husband goes to work. Therapist also recommended that pt does not cook without supervision for safety. Pt became very defensive and did not understand the rationale despite therapist explanation..                  OT Short Term Goals - 07/29/16 1259      OT SHORT TERM GOAL #1   Title Pt  wil lbe Min A home program for memory strategies   Time 4   Period Weeks   Status New     OT SHORT TERM GOAL #2   Title Pt will be Mod I home program for FM/coordination ex's bilateral UE's    Time 4   Period Weeks   Status New     OT SHORT TERM GOAL #3   Title Pt will be Min vc's for fall prevention techniques during ADL's as seen by ability to state 2-3 techniques/strategies.   Time 4   Period Weeks   Status New     OT SHORT TERM GOAL #4   Title Pt will be Mod I w/ 2-3 DME, and a/e options to assist with increased ADL independence   Time 4   Period Weeks   Status New           OT Long Term  Goals - 07/29/16 1302      OT LONG TERM GOAL #1   Title Pt will I'ly state 2-3 memory strategies to assist with safety    Time 8   Period Weeks   Status New     OT LONG TERM GOAL #2   Title Pt will be supervision with simulated tub transfers using DME & A/e PRN   Time 8   Period Weeks   Status New     OT LONG TERM GOAL #3   Title Pt will be supervision level for simple snack prep in ADL kitchen using necessary DME and maintinaing safety   Time 8   Period Weeks   Status New     OT LONG TERM GOAL #4   Title Pt will demonstrate increased strength as seen by RUE gross MMT of 3/5 throughout   Time 8   Period Weeks   Status New     OT LONG TERM GOAL #5   Title Pt will demonstrate improved FM/coordination as seen by improved 9 hole peg test score by 25 seconds or more on the right and 10 seconds or more on the left.   Baseline 07/29/16 = R = 112.32 sec, L = 55.19 sec   Time 8   Period Weeks   Status New               Plan - 08/06/16 1210    Clinical Impression Statement Pt is progressing towards goals. She is limited by decreased awareness and decreased selective attention. Therapist has requested a ST to address cognition.   Rehab Potential Fair   Clinical Impairments Affecting Rehab Potential H/o 20-30 falls in last 6 months; Unknown/Unclear baseline cognitive level; has some DME, does not use; stays home alone wile husband works in evenings   OT Frequency 2x / week   OT Duration 8 weeks   OT Treatment/Interventions Self-care/ADL training;DME and/or AE instruction;Patient/family education;Balance training;Therapeutic exercises;Therapeutic exercise;Therapeutic activities;Cognitive remediation/compensation;Functional Mobility Training;Neuromuscular education;Energy conservation   Plan consider the MOCHA, coordination activities with emphasis on following directions and attending to task   Consulted and Agree with Plan of Care Patient      Patient will benefit from  skilled therapeutic intervention in order to improve the following deficits and impairments:  Decreased coordination, Impaired sensation, Difficulty walking, Decreased safety awareness, Decreased endurance, Decreased activity tolerance, Decreased knowledge of precautions, Pain, Impaired UE functional use, Decreased knowledge of use of DME, Decreased balance, Decreased cognition, Decreased mobility, Decreased strength, Impaired perceived functional ability  Visit Diagnosis: Muscle weakness (generalized)  Other abnormalities of gait and  mobility  Other lack of coordination  Other symptoms and signs involving cognitive functions following other cerebrovascular disease    Problem List Patient Active Problem List   Diagnosis Date Noted  . Cerebral aneurysm, nonruptured 07/03/2016  . Right lower quadrant abdominal pain 05/20/2016  . Cervical spine disease 05/20/2016  . Frequent falls 05/05/2016  . Palpable abdominal aorta 10/11/2015  . Orthostatic hypotension 10/11/2015  . Hx of falling 08/21/2015  . Aneurysm, cerebral, nonruptured 02/12/2015  . Transient confusion 02/12/2015  . Altered mental status 06/05/2014  . New onset headache 06/05/2014  . Essential hypertension 06/05/2014  . Aneurysm ([place]) 06/05/2014  . Unsteady gait 04/30/2014  . Underweight 03/06/2014  . Borderline anemia 03/06/2014  . Other malaise and fatigue 01/01/2014  . Loss of weight 01/01/2014  . Stress 01/01/2014  . Medication management 10/06/2013  . History of back surgery 08/02/2013  . Radicular pain of left lower extremity 08/02/2013  . Elevated blood pressure reading 08/02/2013  . Encounter for Medicare annual wellness exam 08/02/2013  . Adjustment reaction with anxiety and depression 08/02/2013  . Hypothyroidism 08/02/2013  . Spinal stenosis, lumbar region, with neurogenic claudication 03/17/2013  . Herniated lumbar intervertebral disc 03/17/2013  . Wax in ear 07/19/2011  . Decreased hearing 07/19/2011   . TOBACCO USE, QUIT 06/24/2009  . ELEVATED BLOOD PRESSURE WITHOUT DIAGNOSIS OF HYPERTENSION 04/11/2009  . RHINITIS, CHRONIC 06/25/2008  . ACTINIC KERATOSIS, HEAD 06/25/2008  . HEARTBURN 09/12/2007  . LEG PAIN 06/27/2007  . OSTEOPOROSIS 06/27/2007  . Disturbance in sleep behavior 06/27/2007  . NUMBNESS 06/27/2007  . Edema 06/27/2007  . FASTING HYPERGLYCEMIA 06/27/2007  . HYPOTHYROIDISM 04/21/2007  . ROSACEA 04/21/2007  . Osteoarthritis 04/21/2007    RINE,KATHRYN 08/06/2016, 1:08 PM Theone Murdoch, OTR/L [phone] Phone: [phone] 1:15 PM 12/14/17[facility] [address] [place], [place], [address] Phone: [phone]   Fax:  [phone]  Name: NESSIE NONG MRN: [id number] Date of Birth: 08/04/1946

## 2016-08-06 NOTE — Therapy (Signed)
[facility] [address] [place], [place], [address] Phone: [phone]   Fax:  [phone]  Physical Therapy Treatment  Patient Details  Name: [name] MRN: [id number] Date of Birth: [birth year] Referring Provider: Kathyrn Sheriff  Encounter Date: 08/06/2016      PT End of Session - 08/06/16 0939    Visit Number 2   Number of Visits 17   Date for PT Re-Evaluation 10/02/16   Authorization Type [employer] Medicare- G-code every 10 visits   PT Start Time 0931   PT Stop Time 1013   PT Time Calculation (min) 42 min   Equipment Utilized During Treatment Gait belt   Activity Tolerance Patient tolerated treatment well   Behavior During Therapy Impulsive      Past Medical History:  Diagnosis Date  . Anxiety   . Depression   . GERD (gastroesophageal reflux disease)    WOULD USE OTC MEDICATION IF NEEDED  . Hypothyroidism   . 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 Rosacea   . Tachycardia    PT STATES HER HEART RATE USUALLY ABOVE 100  . TIA (transient ischemic attack)     Past Surgical History:  Procedure Laterality Date  . BREAST BIOPSY     bilateral benign  . BUNIONECTOMY  2003   bilateral  . IR GENERIC HISTORICAL  06/05/2016   IR ANGIO VERTEBRAL SEL VERTEBRAL BILAT MOD SED 06/05/2016 Consuella Lose, MD [facility]  . IR GENERIC HISTORICAL  06/05/2016   IR ANGIO INTRA EXTRACRAN SEL INTERNAL CAROTID BILAT MOD SED 06/05/2016 Consuella Lose, MD [facility]  . IR GENERIC HISTORICAL  07/03/2016   IR ANGIOGRAM FOLLOW UP STUDY 07/03/2016 Consuella Lose, MD [facility]  . IR GENERIC HISTORICAL  07/03/2016   IR TRANSCATH/EMBOLIZ 07/03/2016 Consuella Lose, MD [facility]  . LUMBAR LAMINECTOMY/DECOMPRESSION MICRODISCECTOMY Left 03/17/2013   Procedure: LUMBAR LAMINECTOMY/DECOMPRESSION IF RECESS STENOSIS FORAMINOTOMIE OF L4 ROOT AND L5,MICRODISECTOMY  L4-5 ON LEFT;  Surgeon: Tobi Bastos,  MD;  Location: [facility];  Service: Orthopedics;  Laterality: Left;  . RADIOLOGY WITH ANESTHESIA N/A 07/03/2016   Procedure: arteriogram, pipeline embolization of aneurysm;  Surgeon: Consuella Lose, MD;  Location: [facility];  Service: Radiology;  Laterality: N/A;  . TONSILLECTOMY  1953    There were no vitals filed for this visit.      Subjective Assessment - 08/06/16 0934    Subjective Has had 3-4 falls since evaluation. One was when dog knocked her over in squat position, one when getting into bed (pt unable to recall what happended, spouse was not there). Reinforced having someone with her at night due to fall risk and safety issues.    Patient is accompained by: Family member   Pertinent History spouse- Bill   Limitations Walking   Currently in Pain? Yes   Pain Score 9    Pain Location Abdomen   Pain Orientation Right;Lower   Pain Descriptors / Indicators Sharp   Pain Type Other (Comment);Chronic pain  has been months, prior to stroke   Pain Onset More than a month ago   Pain Frequency Intermittent   Aggravating Factors  unsure   Pain Relieving Factors bowel movements help     treatment: Issued the following to pt's HEP. Pt needed constance cues to redirect to task due to distractibility. Pt also required cues to slow down for safety and improved ex form/technique due to impulsivity.  Bridge Pose    With arms across  your chest, lift hips up. Hold them in air for 5 seconds.  Repeat _10__ times. 1-2 times a day.   Copyright  VHI. All rights reserved.   Strengthening: Hip Abductor - Resisted    Lying flat on bed: With band looped around both legs above knees, push thighs apart. Hold for 5 seconds and then slowly bring legs back together. Repeat _10_ times per set. Do _1_ sets per session. Do _1-2_ sessions per day.  http://orth.exer.us/688     Functional Quadriceps: Sit to Stand    Sit on edge of chair, feet flat on floor. Stand upright, extending knees  fully. Repeat _10___ times per set. Do _1_ sets per session. Do _1-2_ sessions per day.  http://orth.exer.us/735   Copyright  VHI. All rights reserved.   In corner with chair in front of you for safety:  Feet Together (Compliant Surface) Varied Arm Positions - Eyes Closed    Stand on compliant surface: _pillow/s_ with feet together and arms as needed for balance Close eyes and visualize upright position. Hold_15_ seconds. Repeat _3 times per session. Do __1-2_ sessions per day.  Copyright  VHI. All rights reserved.   Feet Together (Compliant Surface) Head Motion - Eyes Closed    Stand on compliant surface: _pillow/s__ with feet together. Close eyes and move head slowly: 1. Up and down 2. Left and right Repeat _10___ times per session. Do _1-2_ sessions per day.  Copyright  VHI. All rights reserved.   At kitchen counter top for balance/safety: "I love a Database administrator    Using the counter top for balance: march forward along counter top and then march backwards along counter top. Repeat for 3 laps each way.  Do _1-2_ sessions per day.  http://gt2.exer.us/345   Copyright  VHI. All rights reserved.           PT Education - 08/06/16 1007    Education provided Yes   Education Details HEP for balance and LE/core strengthening   Person(s) Educated Patient;Spouse   Methods Explanation;Demonstration;Verbal cues;Handout   Comprehension Verbalized understanding;Verbal cues required;Tactile cues required;Need further instruction          PT Short Term Goals - 07/29/16 1008      PT SHORT TERM GOAL #1   Title Patient to demonstrate decreased fall risk with Berg score 50/56.   Baseline Target 08/28/16   Time 4   Period Weeks   Status New     PT SHORT TERM GOAL #2   Title Patient to ambulate with LRAD supervision x 300' level indoor tile/carpet.   Time 4   Period Weeks   Status New     PT SHORT TERM GOAL #3   Title Patient to perform HEP for balance and LE  strength with supervision.   Time 4   Period Weeks   Status New     PT SHORT TERM GOAL #4   Title Patient to negotiate stairs with S with rail use 100% of the time.    Time 4   Period Weeks   Status New           PT Long Term Goals - 07/29/16 1014      PT LONG TERM GOAL #1   Title Patient will demonstrate decreased fall risk with Berg score 52/56   Baseline Target 10/02/16   Time 8   Period Weeks     PT LONG TERM GOAL #2   Title Patient will ambulate I level indoor surfaces 300'.   Time  8   Period Weeks   Status New     PT LONG TERM GOAL #3   Title Patient will demonstrate decreased fall risk with TUG manual in 13 seconds or less.   Time 8   Period Weeks   Status New     PT LONG TERM GOAL #4   Title Patient will demonstrate fall recovery with S and verbalize safe techniques for injury prevention.    Time 8   Period Weeks   Status New     PT LONG TERM GOAL #5   Title Patient will negotiate 4 stairs mod I.   Time 8   Period Weeks   Status New             Plan - 08/06/16 0939    Clinical Impression Statement Today's skilled session addressed intitiation of HEP for strengthening and balance. Pt needs max cues to stay on task. Reinforced to pt and spouse need for pt to have 24 hour supervision at this time due to her decreased safety awareness as stated on evaluation.                           Rehab Potential Good   PT Frequency 2x / week   PT Duration 8 weeks   PT Treatment/Interventions Therapeutic activities;Moist Heat;Therapeutic exercise;Balance training;Neuromuscular re-education;Cognitive remediation;Gait training;Stair training;Functional mobility training;Patient/family education;Vestibular;ADLs/Self Care Home Management   PT Next Visit Plan continue to work on balance, LE and core strength.   Consulted and Agree with Plan of Care Patient;Family member/caregiver   Family Member Consulted spouse      Patient will benefit from skilled therapeutic  intervention in order to improve the following deficits and impairments:  Abnormal gait, Decreased safety awareness, Pain, Decreased knowledge of precautions, Decreased activity tolerance, Decreased balance, Decreased cognition, Decreased mobility, Decreased strength, Postural dysfunction, Decreased knowledge of use of DME  Visit Diagnosis: Muscle weakness (generalized)  Other abnormalities of gait and mobility  Other lack of coordination     Problem List Patient Active Problem List   Diagnosis Date Noted  . Cerebral aneurysm, nonruptured 07/03/2016  . Right lower quadrant abdominal pain 05/20/2016  . Cervical spine disease 05/20/2016  . Frequent falls 05/05/2016  . Palpable abdominal aorta 10/11/2015  . Orthostatic hypotension 10/11/2015  . Hx of falling 08/21/2015  . Aneurysm, cerebral, nonruptured 02/12/2015  . Transient confusion 02/12/2015  . Altered mental status 06/05/2014  . New onset headache 06/05/2014  . Essential hypertension 06/05/2014  . Aneurysm ([place]) 06/05/2014  . Unsteady gait 04/30/2014  . Underweight 03/06/2014  . Borderline anemia 03/06/2014  . Other malaise and fatigue 01/01/2014  . Loss of weight 01/01/2014  . Stress 01/01/2014  . Medication management 10/06/2013  . History of back surgery 08/02/2013  . Radicular pain of left lower extremity 08/02/2013  . Elevated blood pressure reading 08/02/2013  . Encounter for Medicare annual wellness exam 08/02/2013  . Adjustment reaction with anxiety and depression 08/02/2013  . Hypothyroidism 08/02/2013  . Spinal stenosis, lumbar region, with neurogenic claudication 03/17/2013  . Herniated lumbar intervertebral disc 03/17/2013  . Wax in ear 07/19/2011  . Decreased hearing 07/19/2011  . TOBACCO USE, QUIT 06/24/2009  . ELEVATED BLOOD PRESSURE WITHOUT DIAGNOSIS OF HYPERTENSION 04/11/2009  . RHINITIS, CHRONIC 06/25/2008  . ACTINIC KERATOSIS, HEAD 06/25/2008  . HEARTBURN 09/12/2007  . LEG PAIN 06/27/2007  .  OSTEOPOROSIS 06/27/2007  . Disturbance in sleep behavior 06/27/2007  . NUMBNESS 06/27/2007  .  Edema 06/27/2007  . FASTING HYPERGLYCEMIA 06/27/2007  . HYPOTHYROIDISM 04/21/2007  . ROSACEA 04/21/2007  . Osteoarthritis 04/21/2007    Willow Ora, PTA, [facility] [address], [place], [place] [id number] [phone] 08/07/16, 11:58 AM   Name: GRETA YUNG MRN: [id number] Date of Birth: 11-27-1945

## 2016-08-06 NOTE — Patient Instructions (Addendum)
Bridge Pose    With arms across your chest, lift hips up. Hold them in air for 5 seconds.  Repeat _10__ times. 1-2 times a day.   Copyright  VHI. All rights reserved.   Strengthening: Hip Abductor - Resisted    Lying flat on bed: With band looped around both legs above knees, push thighs apart. Hold for 5 seconds and then slowly bring legs back together. Repeat _10_ times per set. Do _1_ sets per session. Do _1-2_ sessions per day.  http://orth.exer.us/688     Functional Quadriceps: Sit to Stand    Sit on edge of chair, feet flat on floor. Stand upright, extending knees fully. Repeat _10___ times per set. Do _1_ sets per session. Do _1-2_ sessions per day.  http://orth.exer.us/735   Copyright  VHI. All rights reserved.   In corner with chair in front of you for safety:  Feet Together (Compliant Surface) Varied Arm Positions - Eyes Closed    Stand on compliant surface: _pillow/s_ with feet together and arms as needed for balance Close eyes and visualize upright position. Hold_15_ seconds. Repeat _3 times per session. Do __1-2_ sessions per day.  Copyright  VHI. All rights reserved.   Feet Together (Compliant Surface) Head Motion - Eyes Closed    Stand on compliant surface: _pillow/s__ with feet together. Close eyes and move head slowly: 1. Up and down 2. Left and right Repeat _10___ times per session. Do _1-2_ sessions per day.  Copyright  VHI. All rights reserved.   At kitchen counter top for balance/safety: "I love a Database administrator    Using the counter top for balance: march forward along counter top and then march backwards along counter top. Repeat for 3 laps each way.  Do _1-2_ sessions per day.  http://gt2.exer.us/345   Copyright  VHI. All rights reserved.

## 2016-08-06 NOTE — Patient Instructions (Signed)
Memory Compensation Strategies  1. Use "WARM" strategy.  W= write it down  A= associate it  R= repeat it  M= make a mental note  2.   You can keep a Social worker.  Use a 3-ring notebook with sections for the following: calendar, important names and phone numbers,  medications, doctors' names/phone numbers, lists/reminders, and a section to journal what you did  each day.   3.    Use a calendar to write appointments down.  4.    Write yourself a schedule for the day.  This can be placed on the calendar or in a separate section of the Memory Notebook.  Keeping a  regular schedule can help memory.  5.    Use medication organizer with sections for each day or morning/evening pills.  You may need help loading it  6.    Keep a basket, or pegboard by the door.  Place items that you need to take out with you in the basket or on the pegboard.  You may also want to  include a message board for reminders.  7.    Use sticky notes.  Place sticky notes with reminders in a place where the task is performed.  For example: " turn off the  stove" placed by the stove, "lock the door" placed on the door at eye level, " take your medications" on  the bathroom mirror or by the place where you normally take your medications.  8.    Use alarms/timers.  Use while cooking to remind yourself to check on food or as a reminder to take your medicine, or as a  reminder to make a call, or as a reminder to perform another task, etc.       Coordination Activities  Perform the following activities for 20 minutes 1 times per day with both hand(s).   Rotate ball in fingertips (clockwise and counter-clockwise).  Toss ball between hands.  Flip cards 1 at a time as fast as you can.  Deal cards with your thumb (Hold deck in hand and push card off top with thumb).  Pick up coins and place in container or coin bank.  Pick up coins and stack.  Pick up coins one at a time until you get 5-10 in your hand,  then move coins from palm to fingertips to stack one at a time.

## 2016-08-07 NOTE — Telephone Encounter (Signed)
Placed an order from our office for speech therapy, thanks.

## 2016-08-10 ENCOUNTER — Ambulatory Visit: Payer: Medicare Other | Admitting: Occupational Therapy

## 2016-08-10 ENCOUNTER — Encounter: Payer: Self-pay | Admitting: Occupational Therapy

## 2016-08-10 ENCOUNTER — Ambulatory Visit: Payer: Medicare Other | Admitting: *Deleted

## 2016-08-10 DIAGNOSIS — R2689 Other abnormalities of gait and mobility: Secondary | ICD-10-CM

## 2016-08-10 DIAGNOSIS — M6281 Muscle weakness (generalized): Secondary | ICD-10-CM

## 2016-08-10 DIAGNOSIS — R278 Other lack of coordination: Secondary | ICD-10-CM | POA: Diagnosis not present

## 2016-08-10 DIAGNOSIS — I69818 Other symptoms and signs involving cognitive functions following other cerebrovascular disease: Secondary | ICD-10-CM

## 2016-08-10 NOTE — Therapy (Signed)
[facility] [address] [place], [place], [address] Phone: [phone]   Fax:  [phone]  Physical Therapy Treatment  Patient Details  Name: [name] MRN: [id number] Date of Birth: 1946-08-10 Referring Provider: Kathyrn Sheriff  Encounter Date: 08/10/2016      PT End of Session - 08/10/16 0906    Visit Number 3   Number of Visits 17   Date for PT Re-Evaluation 10/02/16   PT Start Time 0802   PT Stop Time 0847   PT Time Calculation (min) 45 min   Equipment Utilized During Treatment Gait belt   Activity Tolerance Patient tolerated treatment well   Behavior During Therapy [facility] for tasks assessed/performed      Past Medical History:  Diagnosis Date  . Anxiety   . Depression   . GERD (gastroesophageal reflux disease)    WOULD USE OTC MEDICATION IF NEEDED  . Hypothyroidism   . 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 Rosacea   . Tachycardia    PT STATES HER HEART RATE USUALLY ABOVE 100  . TIA (transient ischemic attack)     Past Surgical History:  Procedure Laterality Date  . BREAST BIOPSY     bilateral benign  . BUNIONECTOMY  2003   bilateral  . IR GENERIC HISTORICAL  06/05/2016   IR ANGIO VERTEBRAL SEL VERTEBRAL BILAT MOD SED 06/05/2016 Consuella Lose, MD [facility]  . IR GENERIC HISTORICAL  06/05/2016   IR ANGIO INTRA EXTRACRAN SEL INTERNAL CAROTID BILAT MOD SED 06/05/2016 Consuella Lose, MD [facility]  . IR GENERIC HISTORICAL  07/03/2016   IR ANGIOGRAM FOLLOW UP STUDY 07/03/2016 Consuella Lose, MD [facility]  . IR GENERIC HISTORICAL  07/03/2016   IR TRANSCATH/EMBOLIZ 07/03/2016 Consuella Lose, MD [facility]  . LUMBAR LAMINECTOMY/DECOMPRESSION MICRODISCECTOMY Left 03/17/2013   Procedure: LUMBAR LAMINECTOMY/DECOMPRESSION IF RECESS STENOSIS FORAMINOTOMIE OF L4 ROOT AND L5,MICRODISECTOMY  L4-5 ON LEFT;  Surgeon: Tobi Bastos, MD;  Location: [facility];  Service:  Orthopedics;  Laterality: Left;  . RADIOLOGY WITH ANESTHESIA N/A 07/03/2016   Procedure: arteriogram, pipeline embolization of aneurysm;  Surgeon: Consuella Lose, MD;  Location: [facility];  Service: Radiology;  Laterality: N/A;  . TONSILLECTOMY  1953    There were no vitals filed for this visit.      Subjective Assessment - 08/10/16 0802    Subjective NO falls since last week.  Tried exercises at home.  Reports still unsupervised when spouse at work.  Was not happy with recommendations for having 24 hour assist.   Patient is accompained by: Family member   Pertinent History spouse- Rush Landmark   Currently in Pain? No/denies                         [facility] Adult PT Treatment/Exercise - 08/10/16 0804      Ambulation/Gait   Ambulation/Gait Yes   Ambulation/Gait Assistance 5: Supervision   Ambulation/Gait Assistance Details cues for head righting, posutre   Ambulation Distance (Feet) 850 Feet   Assistive device None   Gait Pattern Step-through pattern;Decreased arm swing - right;Decreased trunk rotation;Lateral trunk lean to right   Ambulation Surface Level;Indoor   Gait Comments also gait over cones with taps to each cone and step past, intermittent UE support for increased SLS and hip flexion     Posture/Postural Control   Posture/Postural Control Postural limitations   Postural Limitations Rounded Shoulders;Forward head  R lateral trunk lean, R  lateral cervical flexion at times     High Level Balance   High Level Balance Activities Marching forwards   High Level Balance Comments balance in corner eyes closed feet together on pillow x 15 sec x 3 reps; on pillow feet apart head nods x 10 (3 sets); head turns x 10 (3 sets)     Neuro Re-ed    Neuro Re-ed Details  seated core strength on green sit disc with stool under feet, weighted ball out and in, up and down, side to side, knee to opposite shoulder with cues for tall posture     Exercises   Exercises Knee/Hip      Knee/Hip Exercises: Standing   Hip Abduction Stengthening;Both;10 reps   Abduction Limitations with red t-band holding on at parallel bars   Hip Extension Stengthening;Both;10 reps   Extension Limitations red t-band with UE support   Other Standing Knee Exercises 10 reps mini squats in parallel bars   Other Standing Knee Exercises heel raises x 10     Knee/Hip Exercises: Seated   Sit to Sand 10 reps;without UE support     Knee/Hip Exercises: Supine   Bridges 1 set;10 reps;Both  5 sec hold   Other Supine Knee/Hip Exercises hooklying clams with red t-band x 12                PT Education - 08/10/16 0904    Education provided Yes   Education Details walking, slower performance for HEP   Person(s) Educated Patient;Spouse   Methods Explanation;Demonstration;Verbal cues   Comprehension Verbalized understanding;Need further instruction          PT Short Term Goals - 07/29/16 1008      PT SHORT TERM GOAL #1   Title Patient to demonstrate decreased fall risk with Berg score 50/56.   Baseline Target 08/28/16   Time 4   Period Weeks   Status New     PT SHORT TERM GOAL #2   Title Patient to ambulate with LRAD supervision x 300' level indoor tile/carpet.   Time 4   Period Weeks   Status New     PT SHORT TERM GOAL #3   Title Patient to perform HEP for balance and LE strength with supervision.   Time 4   Period Weeks   Status New     PT SHORT TERM GOAL #4   Title Patient to negotiate stairs with S with rail use 100% of the time.    Time 4   Period Weeks   Status New           PT Long Term Goals - 07/29/16 1014      PT LONG TERM GOAL #1   Title Patient will demonstrate decreased fall risk with Berg score 52/56   Baseline Target 10/02/16   Time 8   Period Weeks     PT LONG TERM GOAL #2   Title Patient will ambulate I level indoor surfaces 300'.   Time 8   Period Weeks   Status New     PT LONG TERM GOAL #3   Title Patient will demonstrate decreased fall  risk with TUG manual in 13 seconds or less.   Time 8   Period Weeks   Status New     PT LONG TERM GOAL #4   Title Patient will demonstrate fall recovery with S and verbalize safe techniques for injury prevention.    Time 8   Period Weeks   Status New  PT LONG TERM GOAL #5   Title Patient will negotiate 4 stairs mod I.   Time 8   Period Weeks   Status New               Plan - 08/10/16 0907    Clinical Impression Statement Patient needing cues for slower more controlled movements with HEP.  Seems more controlled with gait this session, but needing cues to continue walking.  Continues to be at risk for falls without 24 hour supervision, but still alone while spouse at work with son checking in PRN.  Feel continued skilled PT indicated to progress to goals.    Rehab Potential Good   PT Frequency 2x / week   PT Duration 8 weeks   PT Treatment/Interventions Therapeutic activities;Moist Heat;Therapeutic exercise;Balance training;Neuromuscular re-education;Cognitive remediation;Gait training;Stair training;Functional mobility training;Patient/family education;Vestibular;ADLs/Self Care Home Management   PT Next Visit Plan continue gait, core strength and balance   Consulted and Agree with Plan of Care Patient;Family member/caregiver   Family Member Consulted spouse      Patient will benefit from skilled therapeutic intervention in order to improve the following deficits and impairments:  Abnormal gait, Decreased safety awareness, Pain, Decreased knowledge of precautions, Decreased activity tolerance, Decreased balance, Decreased cognition, Decreased mobility, Decreased strength, Postural dysfunction, Decreased knowledge of use of DME  Visit Diagnosis: Muscle weakness (generalized)  Other abnormalities of gait and mobility  Other lack of coordination     Problem List Patient Active Problem List   Diagnosis Date Noted  . Cerebral aneurysm, nonruptured 07/03/2016  . Right  lower quadrant abdominal pain 05/20/2016  . Cervical spine disease 05/20/2016  . Frequent falls 05/05/2016  . Palpable abdominal aorta 10/11/2015  . Orthostatic hypotension 10/11/2015  . Hx of falling 08/21/2015  . Aneurysm, cerebral, nonruptured 02/12/2015  . Transient confusion 02/12/2015  . Altered mental status 06/05/2014  . New onset headache 06/05/2014  . Essential hypertension 06/05/2014  . Aneurysm ([place]) 06/05/2014  . Unsteady gait 04/30/2014  . Underweight 03/06/2014  . Borderline anemia 03/06/2014  . Other malaise and fatigue 01/01/2014  . Loss of weight 01/01/2014  . Stress 01/01/2014  . Medication management 10/06/2013  . History of back surgery 08/02/2013  . Radicular pain of left lower extremity 08/02/2013  . Elevated blood pressure reading 08/02/2013  . Encounter for Medicare annual wellness exam 08/02/2013  . Adjustment reaction with anxiety and depression 08/02/2013  . Hypothyroidism 08/02/2013  . Spinal stenosis, lumbar region, with neurogenic claudication 03/17/2013  . Herniated lumbar intervertebral disc 03/17/2013  . Wax in ear 07/19/2011  . Decreased hearing 07/19/2011  . TOBACCO USE, QUIT 06/24/2009  . ELEVATED BLOOD PRESSURE WITHOUT DIAGNOSIS OF HYPERTENSION 04/11/2009  . RHINITIS, CHRONIC 06/25/2008  . ACTINIC KERATOSIS, HEAD 06/25/2008  . HEARTBURN 09/12/2007  . LEG PAIN 06/27/2007  . OSTEOPOROSIS 06/27/2007  . Disturbance in sleep behavior 06/27/2007  . NUMBNESS 06/27/2007  . Edema 06/27/2007  . FASTING HYPERGLYCEMIA 06/27/2007  . HYPOTHYROIDISM 04/21/2007  . ROSACEA 04/21/2007  . Osteoarthritis 04/21/2007    Reginia Naas 08/10/2016, 9:13 AM  Magda Kiel, PT [phone] 08/10/2016   [facility] [address] [place], [place], [address] Phone: [phone]   Fax:  [phone]  Name: [name] MRN: [id number] Date of Birth: Apr 24, 1946

## 2016-08-10 NOTE — Therapy (Signed)
[facility] [address] [place], [place], [address] Phone: [phone]   Fax:  [phone]  Occupational Therapy Treatment  Patient Details  Name: [name] MRN: [id number] Date of Birth: 09/07/1945 Referring Provider: Dr. Consuella Lose  Encounter Date: 08/10/2016      OT End of Session - 08/10/16 0946    Visit Number 3   Number of Visits 16   Date for OT Re-Evaluation 08/26/16   Authorization Type [place] Medicare - G code every 10th visit   Authorization - Visit Number 3   Authorization - Number of Visits 10   OT Start Time 6160   OT Stop Time 0931   OT Time Calculation (min) 44 min   Activity Tolerance Patient tolerated treatment well      Past Medical History:  Diagnosis Date  . Anxiety   . Depression   . GERD (gastroesophageal reflux disease)    WOULD USE OTC MEDICATION IF NEEDED  . Hypothyroidism   . 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 Rosacea   . Tachycardia    PT STATES HER HEART RATE USUALLY ABOVE 100  . TIA (transient ischemic attack)     Past Surgical History:  Procedure Laterality Date  . BREAST BIOPSY     bilateral benign  . BUNIONECTOMY  2003   bilateral  . IR GENERIC HISTORICAL  06/05/2016   IR ANGIO VERTEBRAL SEL VERTEBRAL BILAT MOD SED 06/05/2016 Consuella Lose, MD [facility]  . IR GENERIC HISTORICAL  06/05/2016   IR ANGIO INTRA EXTRACRAN SEL INTERNAL CAROTID BILAT MOD SED 06/05/2016 Consuella Lose, MD [facility]  . IR GENERIC HISTORICAL  07/03/2016   IR ANGIOGRAM FOLLOW UP STUDY 07/03/2016 Consuella Lose, MD [facility]  . IR GENERIC HISTORICAL  07/03/2016   IR TRANSCATH/EMBOLIZ 07/03/2016 Consuella Lose, MD [facility]  . LUMBAR LAMINECTOMY/DECOMPRESSION MICRODISCECTOMY Left 03/17/2013   Procedure: LUMBAR LAMINECTOMY/DECOMPRESSION IF RECESS STENOSIS FORAMINOTOMIE OF L4 ROOT AND L5,MICRODISECTOMY  L4-5 ON  LEFT;  Surgeon: Tobi Bastos, MD;  Location: [facility];  Service: Orthopedics;  Laterality: Left;  . RADIOLOGY WITH ANESTHESIA N/A 07/03/2016   Procedure: arteriogram, pipeline embolization of aneurysm;  Surgeon: Consuella Lose, MD;  Location: [facility];  Service: Radiology;  Laterality: N/A;  . TONSILLECTOMY  1953    There were no vitals filed for this visit.      Subjective Assessment - 08/10/16 0849    Subjective  My shoulder hurts when I raise it.   Patient is accompained by: Family member  husband   Pertinent History See EPIC for full history; Currently "with another aneurysm that is growing & needs to be removed" surgically.; No driving, 73-71 falls in last 6 months   Currently in Pain? Yes   Pain Score 5    Pain Location Shoulder   Pain Orientation Right   Pain Descriptors / Indicators Sharp   Pain Type Chronic pain   Pain Onset More than a month ago   Pain Frequency Intermittent   Aggravating Factors  raising  my arm up   Pain Relieving Factors avoiding those movements                      OT Treatments/Exercises (OP) - 08/10/16 0001      Neurological Re-education Exercises   Other Exercises 1 Neuro re ed following manual therapy initially in supine in closed chain activity for chest presses and then overhead  reach - pt needed min facilitation and min vc's to keep eyes open and attend to R hand.  Transitioned to side lying and addressed unilateral overhead reach in closed chain on movring surface with emphasis on scap humeral alignment and rhythm with improved pain free overhead reach. Transitioned to sitting and addressed overhead reach in closed chain progressing to unilateral open chain reach with eventual incorporation of overhead unilateral reach with functional task and resistance (one pound).  At end of session pt with full overhead ROM with no pain for functional task.  Pt only able to complete with minimal resistance however therefore strength  approximately 3+/5.      Manual Therapy   Manual Therapy Joint mobilization;Scapular mobilization;Soft tissue mobilization   Manual therapy comments joint, soft tissue and scap mob to improve alignment in shoulder girdle, increase pain free ROM and improve scap humeral rythm.  Completed in supine and side lying. Pt initially reported 5/10 pain with shoulder flexion to approximately 110* with min compensations.                    OT Short Term Goals - 08/10/16 0945      OT SHORT TERM GOAL #1   Title Pt wil lbe Min A home program for memory strategies   Time 4   Period Weeks   Status On-going     OT SHORT TERM GOAL #2   Title Pt will be Mod I home program for FM/coordination ex's bilateral UE's    Time 4   Period Weeks   Status On-going     OT SHORT TERM GOAL #3   Title Pt will be Min vc's for fall prevention techniques during ADL's as seen by ability to state 2-3 techniques/strategies.   Time 4   Period Weeks   Status On-going     OT SHORT TERM GOAL #4   Title Pt will be Mod I w/ 2-3 DME, and a/e options to assist with increased ADL independence   Time 4   Period Weeks   Status On-going           OT Long Term Goals - 08/10/16 0945      OT LONG TERM GOAL #1   Title Pt will I'ly state 2-3 memory strategies to assist with safety    Time 8   Period Weeks   Status On-going     OT LONG TERM GOAL #2   Title Pt will be supervision with simulated tub transfers using DME & A/e PRN   Time 8   Period Weeks   Status On-going     OT LONG TERM GOAL #3   Title Pt will be supervision level for simple snack prep in ADL kitchen using necessary DME and maintinaing safety   Time 8   Period Weeks   Status On-going     OT LONG TERM GOAL #4   Title Pt will demonstrate increased strength as seen by RUE gross MMT of 3/5 throughout   Time 8   Period Weeks   Status On-going     OT LONG TERM GOAL #5   Title Pt will demonstrate improved FM/coordination as seen by improved  9 hole peg test score by 25 seconds or more on the right and 10 seconds or more on the left.   Baseline 07/29/16 = R = 112.32 sec, L = 55.19 sec   Time 8   Period Weeks   Status On-going  Plan - 08/10/16 0945    Clinical Impression Statement Pt progressing toward RUE functional use goals.  Pt requires min vc's to remain on task.    Rehab Potential Fair   Clinical Impairments Affecting Rehab Potential H/o 20-30 falls in last 6 months; Unknown/Unclear baseline cognitive level; has some DME, does not use; stays home alone wile husband works in evenings   OT Frequency 2x / week   OT Duration 8 weeks   OT Treatment/Interventions Self-care/ADL training;DME and/or AE instruction;Patient/family education;Balance training;Therapeutic exercises;Therapeutic exercise;Therapeutic activities;Cognitive remediation/compensation;Functional Mobility Training;Neuromuscular education;Energy conservation   Plan consider MOCA, simple snack prep/functional activity with emphasis on following directions, attending to task and functional use of RUE.   Consulted and Agree with Plan of Care Patient      Patient will benefit from skilled therapeutic intervention in order to improve the following deficits and impairments:  Decreased coordination, Impaired sensation, Difficulty walking, Decreased safety awareness, Decreased endurance, Decreased activity tolerance, Decreased knowledge of precautions, Pain, Impaired UE functional use, Decreased knowledge of use of DME, Decreased balance, Decreased cognition, Decreased mobility, Decreased strength, Impaired perceived functional ability  Visit Diagnosis: Muscle weakness (generalized)  Other lack of coordination  Other symptoms and signs involving cognitive functions following other cerebrovascular disease    Problem List Patient Active Problem List   Diagnosis Date Noted  . Cerebral aneurysm, nonruptured 07/03/2016  . Right lower quadrant  abdominal pain 05/20/2016  . Cervical spine disease 05/20/2016  . Frequent falls 05/05/2016  . Palpable abdominal aorta 10/11/2015  . Orthostatic hypotension 10/11/2015  . Hx of falling 08/21/2015  . Aneurysm, cerebral, nonruptured 02/12/2015  . Transient confusion 02/12/2015  . Altered mental status 06/05/2014  . New onset headache 06/05/2014  . Essential hypertension 06/05/2014  . Aneurysm ([place]) 06/05/2014  . Unsteady gait 04/30/2014  . Underweight 03/06/2014  . Borderline anemia 03/06/2014  . Other malaise and fatigue 01/01/2014  . Loss of weight 01/01/2014  . Stress 01/01/2014  . Medication management 10/06/2013  . History of back surgery 08/02/2013  . Radicular pain of left lower extremity 08/02/2013  . Elevated blood pressure reading 08/02/2013  . Encounter for Medicare annual wellness exam 08/02/2013  . Adjustment reaction with anxiety and depression 08/02/2013  . Hypothyroidism 08/02/2013  . Spinal stenosis, lumbar region, with neurogenic claudication 03/17/2013  . Herniated lumbar intervertebral disc 03/17/2013  . Wax in ear 07/19/2011  . Decreased hearing 07/19/2011  . TOBACCO USE, QUIT 06/24/2009  . ELEVATED BLOOD PRESSURE WITHOUT DIAGNOSIS OF HYPERTENSION 04/11/2009  . RHINITIS, CHRONIC 06/25/2008  . ACTINIC KERATOSIS, HEAD 06/25/2008  . HEARTBURN 09/12/2007  . LEG PAIN 06/27/2007  . OSTEOPOROSIS 06/27/2007  . Disturbance in sleep behavior 06/27/2007  . NUMBNESS 06/27/2007  . Edema 06/27/2007  . FASTING HYPERGLYCEMIA 06/27/2007  . HYPOTHYROIDISM 04/21/2007  . ROSACEA 04/21/2007  . Osteoarthritis 04/21/2007    Quay Burow 08/10/2016, 9:48 AM  [facility] [address] [place], [place], [address] Phone: [phone]   Fax:  [phone]  Name: SANAIYA WELLIVER MRN: [id number] Date of Birth: [birth year]

## 2016-08-10 NOTE — Patient Instructions (Signed)
Educated pt and spouse to perform HEP slower with more focus.  Discussed walking indoors for exercise and cues for head righting.

## 2016-08-11 ENCOUNTER — Encounter: Payer: Self-pay | Admitting: Physical Therapy

## 2016-08-11 ENCOUNTER — Ambulatory Visit: Payer: Medicare Other | Admitting: Physical Therapy

## 2016-08-11 ENCOUNTER — Encounter: Payer: Self-pay | Admitting: *Deleted

## 2016-08-11 ENCOUNTER — Ambulatory Visit: Payer: Medicare Other | Admitting: *Deleted

## 2016-08-11 DIAGNOSIS — M6281 Muscle weakness (generalized): Secondary | ICD-10-CM

## 2016-08-11 DIAGNOSIS — R2689 Other abnormalities of gait and mobility: Secondary | ICD-10-CM

## 2016-08-11 DIAGNOSIS — I69818 Other symptoms and signs involving cognitive functions following other cerebrovascular disease: Secondary | ICD-10-CM | POA: Diagnosis not present

## 2016-08-11 DIAGNOSIS — R278 Other lack of coordination: Secondary | ICD-10-CM | POA: Diagnosis not present

## 2016-08-11 NOTE — Patient Instructions (Signed)

## 2016-08-11 NOTE — Patient Instructions (Addendum)
Strengthening: Hip Abductor - Resisted    Lying flat on bed: With green band looped around both legs above knees, push thighs apart. Hold for 5 seconds and then slowly bring legs back together. Repeat _10_ times per set. Do _1_ sets per session. Do _1-2_ sessions per day.  http://orth.exer.us/688           Functional Quadriceps: Sit to Stand    Sit on edge of chair, feet flat on floor. Stand upright, extending knees fully. Repeat _10 times per set. Do 1_ sets per session. Do 1-2_ sessions per day.  http://orth.exer.us/735   Copyright  VHI. All rights reserved.   At counter top for balance support at as needed:  "I love a Parade" Lift    High knee marching forward along counter top and then high knee marching backwards along counter top.  Repeat 3 laps each way. 1-2 times a day.   Copyright  VHI. All rights reserved.  Feet Heel-Toe "Tandem"    Arms as needed for balance, walk a straight line forward bringing one foot directly in front of the other and then backwards by bringing one foot directly behind the other one. Repeat for 3 laps each way. Do _1-2_ sessions per day.  Copyright  VHI. All rights reserved.    Walking on Heels    Walk on heels forward and then backwards. Repeat for 3 laps. Do __1-2_ sessions per day.  Copyright  VHI. All rights reserved.  Walking on Toes    Walk on toes forward and then backwards. Repeat for 3 laps each way. Do _1-2_ sessions per day.  Copyright  VHI. All rights reserved.

## 2016-08-11 NOTE — Therapy (Signed)
[facility] [address] [place], [place], [address] Phone: [phone]   Fax:  [phone]  Occupational Therapy Treatment  Patient Details  Name: [name] MRN: [id number] Date of Birth: 04-29-1946 Referring Provider: Dr. Consuella Lose  Encounter Date: 08/11/2016      OT End of Session - 08/11/16 1252    Visit Number 4   Number of Visits 16   Date for OT Re-Evaluation 08/26/16   Authorization Type [place] Medicare - G code every 10th visit   Authorization - Visit Number 4   Authorization - Number of Visits 10   OT Start Time 0758   OT Stop Time 0844   OT Time Calculation (min) 46 min   Activity Tolerance Patient tolerated treatment well   Behavior During Therapy [facility] for tasks assessed/performed      Past Medical History:  Diagnosis Date  . Anxiety   . Depression   . GERD (gastroesophageal reflux disease)    WOULD USE OTC MEDICATION IF NEEDED  . Hypothyroidism   . 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 Rosacea   . Tachycardia    PT STATES HER HEART RATE USUALLY ABOVE 100  . TIA (transient ischemic attack)     Past Surgical History:  Procedure Laterality Date  . BREAST BIOPSY     bilateral benign  . BUNIONECTOMY  2003   bilateral  . IR GENERIC HISTORICAL  06/05/2016   IR ANGIO VERTEBRAL SEL VERTEBRAL BILAT MOD SED 06/05/2016 Consuella Lose, MD [facility]  . IR GENERIC HISTORICAL  06/05/2016   IR ANGIO INTRA EXTRACRAN SEL INTERNAL CAROTID BILAT MOD SED 06/05/2016 Consuella Lose, MD [facility]  . IR GENERIC HISTORICAL  07/03/2016   IR ANGIOGRAM FOLLOW UP STUDY 07/03/2016 Consuella Lose, MD [facility]  . IR GENERIC HISTORICAL  07/03/2016   IR TRANSCATH/EMBOLIZ 07/03/2016 Consuella Lose, MD [facility]  . LUMBAR LAMINECTOMY/DECOMPRESSION MICRODISCECTOMY Left 03/17/2013   Procedure: LUMBAR LAMINECTOMY/DECOMPRESSION IF RECESS STENOSIS  FORAMINOTOMIE OF L4 ROOT AND L5,MICRODISECTOMY  L4-5 ON LEFT;  Surgeon: Tobi Bastos, MD;  Location: [facility];  Service: Orthopedics;  Laterality: Left;  . RADIOLOGY WITH ANESTHESIA N/A 07/03/2016   Procedure: arteriogram, pipeline embolization of aneurysm;  Surgeon: Consuella Lose, MD;  Location: [facility];  Service: Radiology;  Laterality: N/A;  . TONSILLECTOMY  1953    There were no vitals filed for this visit.      Subjective Assessment - 08/11/16 0803    Subjective  Pt reports pain in abdomen which is chronic, she rates it as 10/10. Reports that therapy "Went well yesterday".   Patient is accompained by: Family member  husband   Pertinent History See EPIC for full history; Currently "with another aneurysm that is growing & needs to be removed" surgically.; No driving, 74-12 falls in last 6 months   Currently in Pain? Yes   Pain Score 10-Worst pain ever   Pain Location Abdomen   Pain Orientation Right   Pain Descriptors / Indicators Sharp;Discomfort   Pain Type Chronic pain   Pain Onset More than a month ago   Pain Frequency Intermittent   Pain Relieving Factors Taking Myralax; medication, bowel movement   Multiple Pain Sites No                      OT Treatments/Exercises (OP) - 08/11/16 0001      ADLs   ADL Comments Pt reports  that basic ADL's have "gotten easier" re: fastening pants, getting dressed etc using right dominant hand.     Cognitive Exercises   Other Cognitive Exercises 1 MOCA = 7/30  Pt expressed frustration w/ difficulty during testing   Other Cognitive Exercises 2 Issued and reviewed memory strategies with pt and husband. Discussed that difficulty with cognition and h/o falls, pt is not safe to be home alone. Pt asked "When can I drive" and it was discussed that she was not safe to drive at this time and that a doctor would have to release or clear her for driving. Also discussed that with cognitive changes and h/o falls, that she is not safe.  Pt verbally agreed "I don't really think that I can drive right now" Spouse was present throughout session. Pt spouse reports that he is getting medications ready for her in daily container with AM & PM components. He reminds her to take them.     Exercises   Exercises Hand;Cognitive     Fine Motor Coordination   Other Fine Motor Exercises Yellow putty to remove coins, stack and then dig coins that were burried in putty x10 coins one hand at a time, right and then left hand. Verbal reivew of home program for FM/coordination.                 OT Education - 08/11/16 1249    Education provided Yes   Education Details Issued yellow putty for pinches and placing/removing coins etc R Hand; Discussed findings from [facility] and need for supervision/safety   Person(s) Educated Patient;Spouse   Methods Explanation;Demonstration;Verbal cues   Comprehension Verbalized understanding;Returned demonstration          OT Short Term Goals - 08/11/16 1256      OT SHORT TERM GOAL #1   Title Pt wil lbe Min A home program for memory strategies   Time 4   Period Weeks   Status On-going     OT SHORT TERM GOAL #2   Title Pt will be Mod I home program for FM/coordination ex's bilateral UE's    Time 4   Period Weeks   Status On-going     OT SHORT TERM GOAL #3   Title Pt will be Min vc's for fall prevention techniques during ADL's as seen by ability to state 2-3 techniques/strategies.   Time 4   Period Weeks   Status On-going     OT SHORT TERM GOAL #4   Title Pt will be Mod I w/ 2-3 DME, and a/e options to assist with increased ADL independence   Time 4   Period Weeks   Status On-going           OT Long Term Goals - 08/10/16 0945      OT LONG TERM GOAL #1   Title Pt will I'ly state 2-3 memory strategies to assist with safety    Time 8   Period Weeks   Status On-going     OT LONG TERM GOAL #2   Title Pt will be supervision with simulated tub transfers using DME & A/e PRN   Time 8    Period Weeks   Status On-going     OT LONG TERM GOAL #3   Title Pt will be supervision level for simple snack prep in ADL kitchen using necessary DME and maintinaing safety   Time 8   Period Weeks   Status On-going     OT LONG TERM GOAL #4  Title Pt will demonstrate increased strength as seen by RUE gross MMT of 3/5 throughout   Time 8   Period Weeks   Status On-going     OT LONG TERM GOAL #5   Title Pt will demonstrate improved FM/coordination as seen by improved 9 hole peg test score by 25 seconds or more on the right and 10 seconds or more on the left.   Baseline 07/29/16 = R = 112.32 sec, L = 55.19 sec   Time 8   Period Weeks   Status On-going               Plan - 08/11/16 1252    Clinical Impression Statement Discussed findings from [facility] (7/30) and recommendations of supervision and PRN Assist at all times secondary to cognitive changes. Pt husband works 3pm-11pm and pt is currently home alone in evenings. Pt reports that R UE coordination and functional use has improved, pain has decreased. Issued putty today and reviewed memory strategies with Pt and her husband.   Rehab Potential Fair   Clinical Impairments Affecting Rehab Potential H/o 20-30 falls in last 6 months; Unknown/Unclear baseline cognitive level; has some DME, does not use; stays home alone while husband works in evenings   OT Frequency 2x / week   OT Duration 8 weeks   OT Treatment/Interventions Self-care/ADL training;DME and/or AE instruction;Patient/family education;Balance training;Therapeutic exercises;Therapeutic exercise;Therapeutic activities;Cognitive remediation/compensation;Functional Mobility Training;Neuromuscular education;Energy conservation   Plan Simple snack prep/functional activity with emphasis on following direections, attending to task and functional use of RUE.   Consulted and Agree with Plan of Care Patient;Family member/caregiver   Family Member Consulted Husband      Patient  will benefit from skilled therapeutic intervention in order to improve the following deficits and impairments:  Decreased coordination, Impaired sensation, Difficulty walking, Decreased safety awareness, Decreased endurance, Decreased activity tolerance, Decreased knowledge of precautions, Pain, Impaired UE functional use, Decreased knowledge of use of DME, Decreased balance, Decreased cognition, Decreased mobility, Decreased strength, Impaired perceived functional ability  Visit Diagnosis: Other symptoms and signs involving cognitive functions following other cerebrovascular disease  Other lack of coordination  Muscle weakness (generalized)  Other abnormalities of gait and mobility    Problem List Patient Active Problem List   Diagnosis Date Noted  . Cerebral aneurysm, nonruptured 07/03/2016  . Right lower quadrant abdominal pain 05/20/2016  . Cervical spine disease 05/20/2016  . Frequent falls 05/05/2016  . Palpable abdominal aorta 10/11/2015  . Orthostatic hypotension 10/11/2015  . Hx of falling 08/21/2015  . Aneurysm, cerebral, nonruptured 02/12/2015  . Transient confusion 02/12/2015  . Altered mental status 06/05/2014  . New onset headache 06/05/2014  . Essential hypertension 06/05/2014  . Aneurysm ([place]) 06/05/2014  . Unsteady gait 04/30/2014  . Underweight 03/06/2014  . Borderline anemia 03/06/2014  . Other malaise and fatigue 01/01/2014  . Loss of weight 01/01/2014  . Stress 01/01/2014  . Medication management 10/06/2013  . History of back surgery 08/02/2013  . Radicular pain of left lower extremity 08/02/2013  . Elevated blood pressure reading 08/02/2013  . Encounter for Medicare annual wellness exam 08/02/2013  . Adjustment reaction with anxiety and depression 08/02/2013  . Hypothyroidism 08/02/2013  . Spinal stenosis, lumbar region, with neurogenic claudication 03/17/2013  . Herniated lumbar intervertebral disc 03/17/2013  . Wax in ear 07/19/2011  . Decreased  hearing 07/19/2011  . TOBACCO USE, QUIT 06/24/2009  . ELEVATED BLOOD PRESSURE WITHOUT DIAGNOSIS OF HYPERTENSION 04/11/2009  . RHINITIS, CHRONIC 06/25/2008  . ACTINIC KERATOSIS, HEAD  06/25/2008  . HEARTBURN 09/12/2007  . LEG PAIN 06/27/2007  . OSTEOPOROSIS 06/27/2007  . Disturbance in sleep behavior 06/27/2007  . NUMBNESS 06/27/2007  . Edema 06/27/2007  . FASTING HYPERGLYCEMIA 06/27/2007  . HYPOTHYROIDISM 04/21/2007  . ROSACEA 04/21/2007  . Osteoarthritis 04/21/2007    Almyra Deforest, OTR/L 08/11/2016, 12:58 PM  [facility] [address] [place], [place], [address] Phone: [phone]   Fax:  [phone]  Name: [name] MRN: [id number] Date of Birth: 26-Feb-1946

## 2016-08-11 NOTE — Therapy (Signed)
[facility] [address] [place], [place], [address] Phone: [phone]   Fax:  [phone]  Physical Therapy Treatment  Patient Details  Name: [name] MRN: [id number] Date of Birth: 1946-08-01 Referring Provider: Kathyrn Sheriff  Encounter Date: 08/11/2016      PT End of Session - 08/11/16 0849    Visit Number 4   Number of Visits 17   Date for PT Re-Evaluation 10/02/16   PT Start Time 0845   PT Stop Time 0930   PT Time Calculation (min) 45 min   Equipment Utilized During Treatment Gait belt   Activity Tolerance Patient tolerated treatment well   Behavior During Therapy [facility] for tasks assessed/performed      Past Medical History:  Diagnosis Date  . Anxiety   . Depression   . GERD (gastroesophageal reflux disease)    WOULD USE OTC MEDICATION IF NEEDED  . Hypothyroidism   . 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 Rosacea   . Tachycardia    PT STATES HER HEART RATE USUALLY ABOVE 100  . TIA (transient ischemic attack)     Past Surgical History:  Procedure Laterality Date  . BREAST BIOPSY     bilateral benign  . BUNIONECTOMY  2003   bilateral  . IR GENERIC HISTORICAL  06/05/2016   IR ANGIO VERTEBRAL SEL VERTEBRAL BILAT MOD SED 06/05/2016 Consuella Lose, MD [facility]  . IR GENERIC HISTORICAL  06/05/2016   IR ANGIO INTRA EXTRACRAN SEL INTERNAL CAROTID BILAT MOD SED 06/05/2016 Consuella Lose, MD [facility]  . IR GENERIC HISTORICAL  07/03/2016   IR ANGIOGRAM FOLLOW UP STUDY 07/03/2016 Consuella Lose, MD [facility]  . IR GENERIC HISTORICAL  07/03/2016   IR TRANSCATH/EMBOLIZ 07/03/2016 Consuella Lose, MD [facility]  . LUMBAR LAMINECTOMY/DECOMPRESSION MICRODISCECTOMY Left 03/17/2013   Procedure: LUMBAR LAMINECTOMY/DECOMPRESSION IF RECESS STENOSIS FORAMINOTOMIE OF L4 ROOT AND L5,MICRODISECTOMY  L4-5 ON LEFT;  Surgeon: Tobi Bastos, MD;  Location: [facility];  Service:  Orthopedics;  Laterality: Left;  . RADIOLOGY WITH ANESTHESIA N/A 07/03/2016   Procedure: arteriogram, pipeline embolization of aneurysm;  Surgeon: Consuella Lose, MD;  Location: [facility];  Service: Radiology;  Laterality: N/A;  . TONSILLECTOMY  1953    There were no vitals filed for this visit.      Subjective Assessment - 08/11/16 0846    Subjective No new compaints.  No falls to report.    Patient is accompained by: Family member   Pertinent History spouse- Bill   Limitations Walking   Currently in Pain? Yes   Pain Score 3    Pain Location Abdomen   Pain Orientation Right   Pain Descriptors / Indicators Aching;Sore   Pain Type Chronic pain   Pain Onset More than a month ago   Pain Frequency Intermittent   Aggravating Factors  raising arms up   Pain Relieving Factors taking Myralax, medication, bowel movements     Treatment: Advanced HEP to include the following: Strengthening: Hip Abductor - Resisted    Lying flat on bed: With green band looped around both legs above knees, push thighs apart. Hold for 5 seconds and then slowly bring legs back together. Repeat _10_ times per set. Do _1_ sets per session. Do _1-2_ sessions per day.  http://orth.exer.us/688     Functional Quadriceps: Sit to Stand    Sit on edge of chair, feet flat on floor. Stand upright, extending knees fully. Repeat _10 times per set. Do  1_ sets per session. Do 1-2_ sessions per day.  http://orth.exer.us/735   Copyright  VHI. All rights reserved.   At counter top for balance support at as needed:  "I love a Parade" Lift    High knee marching forward along counter top and then high knee marching backwards along counter top.  Repeat 3 laps each way. 1-2 times a day.   Copyright  VHI. All rights reserved.  Feet Heel-Toe "Tandem"    Arms as needed for balance, walk a straight line forward bringing one foot directly in front of the other and then backwards by bringing one foot directly  behind the other one. Repeat for 3 laps each way. Do _1-2_ sessions per day.  Copyright  VHI. All rights reserved.    Walking on Heels    Walk on heels forward and then backwards. Repeat for 3 laps. Do __1-2_ sessions per day.  Copyright  VHI. All rights reserved.  Walking on Toes    Walk on toes forward and then backwards. Repeat for 3 laps each way. Do _1-2_ sessions per day.  Copyright  VHI. All rights reserved.         [facility] Adult PT Treatment/Exercise - 08/11/16 0914      Ambulation/Gait   Ambulation/Gait Yes   Ambulation/Gait Assistance Details with neuro re-ed activities-refer to that section   Assistive device None   Gait Pattern Step-through pattern;Decreased arm swing - right;Decreased trunk rotation;Lateral trunk lean to right   Ambulation Surface Level;Indoor     High Level Balance   High Level Balance Activities Head turns;Other (comment)  speed changes, +what was added to HEP-see instructions   High Level Balance Comments gait around track in gym:  ball self tossing with cues to task/how to toss ball up vs fwd with up to min assist as pt "ran" after ball/reached to get ball, gait with speed changes fast<>slow with min guard to min assist, gait with head movments up, down, left and right (vaired directions) with min guard to min assist for balance, decreased cadence with head movements and veering noted with head movements left<>right.                         Neuro Re-ed    Neuro Re-ed Details  standing with feet across blue foam beam: alternating fwd stepping to floor and then back onto foam x 10 each leg alternating bwd stepping to floor and then back onto foam x 10 each leg, min to mod assist with verbal/visual cues needed for ex form and technique.                                                   PT Education - 08/11/16 0926    Education provided Yes   Education Details advanced HEP   Person(s) Educated Patient;Spouse   Methods  Explanation;Demonstration;Verbal cues;Handout   Comprehension Verbalized understanding;Returned demonstration;Need further instruction;Verbal cues required          PT Short Term Goals - 07/29/16 1008      PT SHORT TERM GOAL #1   Title Patient to demonstrate decreased fall risk with Berg score 50/56.   Baseline Target 08/28/16   Time 4   Period Weeks   Status New     PT SHORT TERM GOAL #2   Title Patient  to ambulate with LRAD supervision x 300' level indoor tile/carpet.   Time 4   Period Weeks   Status New     PT SHORT TERM GOAL #3   Title Patient to perform HEP for balance and LE strength with supervision.   Time 4   Period Weeks   Status New     PT SHORT TERM GOAL #4   Title Patient to negotiate stairs with S with rail use 100% of the time.    Time 4   Period Weeks   Status New           PT Long Term Goals - 07/29/16 1014      PT LONG TERM GOAL #1   Title Patient will demonstrate decreased fall risk with Berg score 52/56   Baseline Target 10/02/16   Time 8   Period Weeks     PT LONG TERM GOAL #2   Title Patient will ambulate I level indoor surfaces 300'.   Time 8   Period Weeks   Status New     PT LONG TERM GOAL #3   Title Patient will demonstrate decreased fall risk with TUG manual in 13 seconds or less.   Time 8   Period Weeks   Status New     PT LONG TERM GOAL #4   Title Patient will demonstrate fall recovery with S and verbalize safe techniques for injury prevention.    Time 8   Period Weeks   Status New     PT LONG TERM GOAL #5   Title Patient will negotiate 4 stairs mod I.   Time 8   Period Weeks   Status New            Plan - 08/11/16 0850    Clinical Impression Statement Today's skilled session focused on advancing HEP as pt will be out of town for holiday for 1-2 weeks. Remainder of session continued to address high level balance activities. Pt is making steady progress toward goals and should benefit from continued PT to progress  toward goals not met.                   Rehab Potential Good   PT Frequency 2x / week   PT Duration 8 weeks   PT Treatment/Interventions Therapeutic activities;Moist Heat;Therapeutic exercise;Balance training;Neuromuscular re-education;Cognitive remediation;Gait training;Stair training;Functional mobility training;Patient/family education;Vestibular;ADLs/Self Care Home Management   PT Next Visit Plan continue gait, core strength and balance   Consulted and Agree with Plan of Care Patient;Family member/caregiver   Family Member Consulted spouse      Patient will benefit from skilled therapeutic intervention in order to improve the following deficits and impairments:  Abnormal gait, Decreased safety awareness, Pain, Decreased knowledge of precautions, Decreased activity tolerance, Decreased balance, Decreased cognition, Decreased mobility, Decreased strength, Postural dysfunction, Decreased knowledge of use of DME  Visit Diagnosis: Muscle weakness (generalized)  Other symptoms and signs involving cognitive functions following other cerebrovascular disease  Other abnormalities of gait and mobility     Problem List Patient Active Problem List   Diagnosis Date Noted  . Cerebral aneurysm, nonruptured 07/03/2016  . Right lower quadrant abdominal pain 05/20/2016  . Cervical spine disease 05/20/2016  . Frequent falls 05/05/2016  . Palpable abdominal aorta 10/11/2015  . Orthostatic hypotension 10/11/2015  . Hx of falling 08/21/2015  . Aneurysm, cerebral, nonruptured 02/12/2015  . Transient confusion 02/12/2015  . Altered mental status 06/05/2014  . New onset headache 06/05/2014  . Essential hypertension  06/05/2014  . Aneurysm ([place]) 06/05/2014  . Unsteady gait 04/30/2014  . Underweight 03/06/2014  . Borderline anemia 03/06/2014  . Other malaise and fatigue 01/01/2014  . Loss of weight 01/01/2014  . Stress 01/01/2014  . Medication management 10/06/2013  . History of back surgery  08/02/2013  . Radicular pain of left lower extremity 08/02/2013  . Elevated blood pressure reading 08/02/2013  . Encounter for Medicare annual wellness exam 08/02/2013  . Adjustment reaction with anxiety and depression 08/02/2013  . Hypothyroidism 08/02/2013  . Spinal stenosis, lumbar region, with neurogenic claudication 03/17/2013  . Herniated lumbar intervertebral disc 03/17/2013  . Wax in ear 07/19/2011  . Decreased hearing 07/19/2011  . TOBACCO USE, QUIT 06/24/2009  . ELEVATED BLOOD PRESSURE WITHOUT DIAGNOSIS OF HYPERTENSION 04/11/2009  . RHINITIS, CHRONIC 06/25/2008  . ACTINIC KERATOSIS, HEAD 06/25/2008  . HEARTBURN 09/12/2007  . LEG PAIN 06/27/2007  . OSTEOPOROSIS 06/27/2007  . Disturbance in sleep behavior 06/27/2007  . NUMBNESS 06/27/2007  . Edema 06/27/2007  . FASTING HYPERGLYCEMIA 06/27/2007  . HYPOTHYROIDISM 04/21/2007  . ROSACEA 04/21/2007  . Osteoarthritis 04/21/2007    Willow Ora, PTA, [facility] [address] [place], [place] [address] [phone] 08/11/16, 8:45 PM   Name: [name] MRN: [id number] Date of Birth: [birth year]

## 2016-08-18 ENCOUNTER — Other Ambulatory Visit: Payer: Self-pay | Admitting: Internal Medicine

## 2016-08-18 NOTE — Telephone Encounter (Signed)
Denied.  Refills of this medication need to come from her specialist.  Message sent to the pharmacy.

## 2016-08-19 ENCOUNTER — Inpatient Hospital Stay (HOSPITAL_COMMUNITY): Payer: Medicare Other

## 2016-08-19 ENCOUNTER — Emergency Department (HOSPITAL_COMMUNITY): Payer: Medicare Other

## 2016-08-19 ENCOUNTER — Inpatient Hospital Stay (HOSPITAL_COMMUNITY)
Admission: EM | Admit: 2016-08-19 | Discharge: 2016-08-21 | DRG: 065 | Disposition: A | Discharge status: Home / Self Care | Payer: Medicare Other | Attending: Family Medicine | Admitting: Family Medicine

## 2016-08-19 ENCOUNTER — Encounter (HOSPITAL_COMMUNITY): Payer: Self-pay | Admitting: *Deleted

## 2016-08-19 DIAGNOSIS — H9190 Unspecified hearing loss, unspecified ear: Secondary | ICD-10-CM | POA: Diagnosis not present

## 2016-08-19 DIAGNOSIS — D6859 Other primary thrombophilia: Secondary | ICD-10-CM | POA: Diagnosis not present

## 2016-08-19 DIAGNOSIS — I639 Cerebral infarction, unspecified: Principal | ICD-10-CM | POA: Diagnosis present

## 2016-08-19 DIAGNOSIS — C3431 Malignant neoplasm of lower lobe, right bronchus or lung: Secondary | ICD-10-CM | POA: Diagnosis not present

## 2016-08-19 DIAGNOSIS — J939 Pneumothorax, unspecified: Secondary | ICD-10-CM | POA: Diagnosis present

## 2016-08-19 DIAGNOSIS — I638 Other cerebral infarction: Secondary | ICD-10-CM | POA: Diagnosis not present

## 2016-08-19 DIAGNOSIS — M545 Low back pain: Secondary | ICD-10-CM | POA: Diagnosis not present

## 2016-08-19 DIAGNOSIS — R29810 Facial weakness: Secondary | ICD-10-CM | POA: Diagnosis present

## 2016-08-19 DIAGNOSIS — I10 Essential (primary) hypertension: Secondary | ICD-10-CM | POA: Diagnosis not present

## 2016-08-19 DIAGNOSIS — R1013 Epigastric pain: Secondary | ICD-10-CM | POA: Diagnosis not present

## 2016-08-19 DIAGNOSIS — Z7982 Long term (current) use of aspirin: Secondary | ICD-10-CM

## 2016-08-19 DIAGNOSIS — I63312 Cerebral infarction due to thrombosis of left middle cerebral artery: Secondary | ICD-10-CM | POA: Diagnosis not present

## 2016-08-19 DIAGNOSIS — F17210 Nicotine dependence, cigarettes, uncomplicated: Secondary | ICD-10-CM | POA: Diagnosis present

## 2016-08-19 DIAGNOSIS — Z79899 Other long term (current) drug therapy: Secondary | ICD-10-CM

## 2016-08-19 DIAGNOSIS — Z888 Allergy status to other drugs, medicaments and biological substances status: Secondary | ICD-10-CM

## 2016-08-19 DIAGNOSIS — I959 Hypotension, unspecified: Secondary | ICD-10-CM | POA: Diagnosis present

## 2016-08-19 DIAGNOSIS — F329 Major depressive disorder, single episode, unspecified: Secondary | ICD-10-CM | POA: Diagnosis present

## 2016-08-19 DIAGNOSIS — R1011 Right upper quadrant pain: Secondary | ICD-10-CM | POA: Diagnosis not present

## 2016-08-19 DIAGNOSIS — K219 Gastro-esophageal reflux disease without esophagitis: Secondary | ICD-10-CM | POA: Diagnosis not present

## 2016-08-19 DIAGNOSIS — E876 Hypokalemia: Secondary | ICD-10-CM | POA: Diagnosis present

## 2016-08-19 DIAGNOSIS — R109 Unspecified abdominal pain: Secondary | ICD-10-CM | POA: Diagnosis not present

## 2016-08-19 DIAGNOSIS — K560 Paralytic ileus: Secondary | ICD-10-CM | POA: Diagnosis present

## 2016-08-19 DIAGNOSIS — J439 Emphysema, unspecified: Secondary | ICD-10-CM | POA: Diagnosis present

## 2016-08-19 DIAGNOSIS — C7951 Secondary malignant neoplasm of bone: Secondary | ICD-10-CM | POA: Diagnosis present

## 2016-08-19 DIAGNOSIS — R918 Other nonspecific abnormal finding of lung field: Secondary | ICD-10-CM

## 2016-08-19 DIAGNOSIS — F419 Anxiety disorder, unspecified: Secondary | ICD-10-CM | POA: Diagnosis present

## 2016-08-19 DIAGNOSIS — N289 Disorder of kidney and ureter, unspecified: Secondary | ICD-10-CM | POA: Diagnosis not present

## 2016-08-19 DIAGNOSIS — J910 Malignant pleural effusion: Secondary | ICD-10-CM | POA: Diagnosis not present

## 2016-08-19 DIAGNOSIS — I671 Cerebral aneurysm, nonruptured: Secondary | ICD-10-CM | POA: Diagnosis not present

## 2016-08-19 DIAGNOSIS — M1990 Unspecified osteoarthritis, unspecified site: Secondary | ICD-10-CM | POA: Diagnosis not present

## 2016-08-19 DIAGNOSIS — G8191 Hemiplegia, unspecified affecting right dominant side: Secondary | ICD-10-CM | POA: Diagnosis not present

## 2016-08-19 DIAGNOSIS — E039 Hypothyroidism, unspecified: Secondary | ICD-10-CM | POA: Diagnosis present

## 2016-08-19 DIAGNOSIS — K56609 Unspecified intestinal obstruction, unspecified as to partial versus complete obstruction: Secondary | ICD-10-CM | POA: Diagnosis not present

## 2016-08-19 DIAGNOSIS — R222 Localized swelling, mass and lump, trunk: Secondary | ICD-10-CM | POA: Diagnosis not present

## 2016-08-19 DIAGNOSIS — J95811 Postprocedural pneumothorax: Secondary | ICD-10-CM

## 2016-08-19 DIAGNOSIS — E785 Hyperlipidemia, unspecified: Secondary | ICD-10-CM | POA: Diagnosis present

## 2016-08-19 DIAGNOSIS — R471 Dysarthria and anarthria: Secondary | ICD-10-CM | POA: Diagnosis present

## 2016-08-19 HISTORY — DX: Anemia, unspecified: D64.9

## 2016-08-19 HISTORY — DX: Cerebral infarction, unspecified: I63.9

## 2016-08-19 HISTORY — DX: Pneumonia, unspecified organism: J18.9

## 2016-08-19 HISTORY — DX: Headache: R51

## 2016-08-19 HISTORY — DX: Other nonspecific abnormal finding of lung field: R91.8

## 2016-08-19 HISTORY — DX: Basal cell carcinoma of skin, unspecified: C44.91

## 2016-08-19 HISTORY — DX: Emphysema, unspecified: J43.9

## 2016-08-19 HISTORY — DX: Headache, unspecified: R51.9

## 2016-08-19 LAB — COMPREHENSIVE METABOLIC PANEL
ALBUMIN: 2.7 g/dL — AB (ref 3.5–5.0)
ALK PHOS: 82 U/L (ref 38–126)
ALT: 7 U/L — ABNORMAL LOW (ref 14–54)
AST: 17 U/L (ref 15–41)
Anion gap: 7 (ref 5–15)
BILIRUBIN TOTAL: 0.1 mg/dL — AB (ref 0.3–1.2)
BUN: 9 mg/dL (ref 6–20)
CALCIUM: 8.8 mg/dL — AB (ref 8.9–10.3)
CO2: 25 mmol/L (ref 22–32)
Chloride: 103 mmol/L (ref 101–111)
Creatinine, Ser: 1.18 mg/dL — ABNORMAL HIGH (ref 0.44–1.00)
GFR calc Af Amer: 53 mL/min — ABNORMAL LOW (ref >60)
GFR calc non Af Amer: 46 mL/min — ABNORMAL LOW (ref >60)
GLUCOSE: 149 mg/dL — AB (ref 65–99)
Potassium: 3.4 mmol/L — ABNORMAL LOW (ref 3.5–5.1)
Sodium: 135 mmol/L (ref 135–145)
TOTAL PROTEIN: 6.2 g/dL — AB (ref 6.5–8.1)

## 2016-08-19 LAB — CBC WITH DIFFERENTIAL/PLATELET
BASOS ABS: 0 10*3/uL (ref 0.0–0.1)
BASOS PCT: 0 %
Eosinophils Absolute: 0.3 10*3/uL (ref 0.0–0.7)
Eosinophils Relative: 3 %
HEMATOCRIT: 30 % — AB (ref 36.0–46.0)
HEMOGLOBIN: 9.5 g/dL — AB (ref 12.0–15.0)
Lymphocytes Relative: 13 %
Lymphs Abs: 1.3 10*3/uL (ref 0.7–4.0)
MCH: 30.3 pg (ref 26.0–34.0)
MCHC: 31.7 g/dL (ref 30.0–36.0)
MCV: 95.5 fL (ref 78.0–100.0)
MONOS PCT: 7 %
Monocytes Absolute: 0.7 10*3/uL (ref 0.1–1.0)
NEUTROS ABS: 7.9 10*3/uL — AB (ref 1.7–7.7)
NEUTROS PCT: 77 %
Platelets: 423 10*3/uL — ABNORMAL HIGH (ref 150–400)
RBC: 3.14 MIL/uL — ABNORMAL LOW (ref 3.87–5.11)
RDW: 14.2 % (ref 11.5–15.5)
WBC: 10.2 10*3/uL (ref 4.0–10.5)

## 2016-08-19 LAB — LIPASE, BLOOD: Lipase: 52 U/L — ABNORMAL HIGH (ref 11–51)

## 2016-08-19 MED ORDER — SODIUM CHLORIDE 0.9 % IV BOLUS (SEPSIS)
500.0000 mL | Freq: Once | INTRAVENOUS | Status: AC
  Administered 2016-08-19: 500 mL via INTRAVENOUS

## 2016-08-19 MED ORDER — POLYETHYLENE GLYCOL 3350 17 G PO PACK
17.0000 g | PACK | Freq: Every day | ORAL | Status: DC
  Administered 2016-08-20 – 2016-08-21 (×2): 17 g via ORAL
  Filled 2016-08-19 (×2): qty 1

## 2016-08-19 MED ORDER — MORPHINE SULFATE (PF) 4 MG/ML IV SOLN
2.0000 mg | Freq: Once | INTRAVENOUS | Status: AC
  Administered 2016-08-19: 2 mg via INTRAVENOUS
  Filled 2016-08-19: qty 1

## 2016-08-19 MED ORDER — ASPIRIN EC 325 MG PO TBEC
325.0000 mg | DELAYED_RELEASE_TABLET | Freq: Every morning | ORAL | Status: DC
  Administered 2016-08-20 – 2016-08-21 (×2): 325 mg via ORAL
  Filled 2016-08-19 (×2): qty 1

## 2016-08-19 MED ORDER — SENNOSIDES-DOCUSATE SODIUM 8.6-50 MG PO TABS
1.0000 | ORAL_TABLET | Freq: Two times a day (BID) | ORAL | Status: DC
  Administered 2016-08-19 – 2016-08-21 (×4): 1 via ORAL
  Filled 2016-08-19 (×4): qty 1

## 2016-08-19 MED ORDER — PANTOPRAZOLE SODIUM 40 MG IV SOLR
40.0000 mg | Freq: Once | INTRAVENOUS | Status: AC
  Administered 2016-08-19: 40 mg via INTRAVENOUS
  Filled 2016-08-19: qty 40

## 2016-08-19 MED ORDER — IOPAMIDOL (ISOVUE-370) INJECTION 76%
INTRAVENOUS | Status: AC
  Filled 2016-08-19: qty 50

## 2016-08-19 MED ORDER — SODIUM CHLORIDE 0.9 % IV SOLN
INTRAVENOUS | Status: DC
  Administered 2016-08-20: 16:00:00 via INTRAVENOUS
  Administered 2016-08-21: 1 mL via INTRAVENOUS
  Administered 2016-08-21: 13:00:00 via INTRAVENOUS

## 2016-08-19 MED ORDER — FLEET ENEMA 7-19 GM/118ML RE ENEM
1.0000 | ENEMA | Freq: Once | RECTAL | Status: AC
  Administered 2016-08-20: 1 via RECTAL
  Filled 2016-08-19: qty 1

## 2016-08-19 MED ORDER — DULOXETINE HCL 30 MG PO CPEP
30.0000 mg | ORAL_CAPSULE | Freq: Every day | ORAL | Status: DC
  Administered 2016-08-20 – 2016-08-21 (×2): 30 mg via ORAL
  Filled 2016-08-19 (×2): qty 1

## 2016-08-19 MED ORDER — IOPAMIDOL (ISOVUE-300) INJECTION 61%
INTRAVENOUS | Status: AC
  Administered 2016-08-19: 75 mL
  Filled 2016-08-19: qty 75

## 2016-08-19 MED ORDER — SODIUM CHLORIDE 0.9 % IV SOLN
INTRAVENOUS | Status: DC
  Administered 2016-08-19: 23:00:00 via INTRAVENOUS

## 2016-08-19 MED ORDER — ACETAMINOPHEN 325 MG PO TABS
650.0000 mg | ORAL_TABLET | ORAL | Status: DC | PRN
  Administered 2016-08-20 (×4): 650 mg via ORAL
  Filled 2016-08-19 (×4): qty 2

## 2016-08-19 MED ORDER — LEVOTHYROXINE SODIUM 75 MCG PO TABS
75.0000 ug | ORAL_TABLET | Freq: Every day | ORAL | Status: DC
  Administered 2016-08-20 – 2016-08-21 (×2): 75 ug via ORAL
  Filled 2016-08-19 (×2): qty 1

## 2016-08-19 MED ORDER — ACETAMINOPHEN 650 MG RE SUPP: 650.0000 mg | RECTAL | Status: DC | PRN

## 2016-08-19 MED ORDER — GI COCKTAIL ~~LOC~~
30.0000 mL | Freq: Once | ORAL | Status: AC
  Administered 2016-08-19: 30 mL via ORAL
  Filled 2016-08-19: qty 30

## 2016-08-19 MED ORDER — ASPIRIN-DIPYRIDAMOLE ER 25-200 MG PO CP12
1.0000 | ORAL_CAPSULE | Freq: Two times a day (BID) | ORAL | Status: DC
  Administered 2016-08-19: 1 via ORAL
  Filled 2016-08-19 (×2): qty 1

## 2016-08-19 MED ORDER — GADOBENATE DIMEGLUMINE 529 MG/ML IV SOLN
10.0000 mL | Freq: Once | INTRAVENOUS | Status: AC
  Administered 2016-08-19: 10 mL via INTRAVENOUS

## 2016-08-19 MED ORDER — ACETAMINOPHEN 160 MG/5ML PO SOLN
650.0000 mg | ORAL | Status: DC | PRN
Start: 2016-08-19 — End: 2016-08-21

## 2016-08-19 MED ORDER — AMITRIPTYLINE HCL 100 MG PO TABS
300.0000 mg | ORAL_TABLET | Freq: Every day | ORAL | Status: DC
  Administered 2016-08-19 – 2016-08-20 (×2): 300 mg via ORAL
  Filled 2016-08-19: qty 12
  Filled 2016-08-19: qty 3
  Filled 2016-08-19: qty 12
  Filled 2016-08-19: qty 3

## 2016-08-19 MED ORDER — WHITE PETROLATUM GEL
Status: AC
  Administered 2016-08-19: 23:00:00
  Filled 2016-08-19: qty 1

## 2016-08-19 MED ORDER — STROKE: EARLY STAGES OF RECOVERY BOOK
Freq: Once | Status: AC
  Administered 2016-08-19: 23:00:00

## 2016-08-19 MED ORDER — ONDANSETRON HCL 4 MG/2ML IJ SOLN
4.0000 mg | Freq: Once | INTRAMUSCULAR | Status: AC
  Administered 2016-08-19: 4 mg via INTRAVENOUS
  Filled 2016-08-19: qty 2

## 2016-08-19 MED ORDER — SUCRALFATE 1 G PO TABS
1.0000 g | ORAL_TABLET | Freq: Once | ORAL | Status: AC
  Administered 2016-08-19: 1 g via ORAL
  Filled 2016-08-19: qty 1

## 2016-08-19 MED ORDER — METOCLOPRAMIDE HCL 5 MG PO TABS
5.0000 mg | ORAL_TABLET | Freq: Three times a day (TID) | ORAL | Status: DC
  Administered 2016-08-20 (×2): 5 mg via ORAL
  Filled 2016-08-19 (×2): qty 1

## 2016-08-19 MED ORDER — ENOXAPARIN SODIUM 40 MG/0.4ML ~~LOC~~ SOLN
40.0000 mg | Freq: Every day | SUBCUTANEOUS | Status: DC
  Administered 2016-08-19: 40 mg via SUBCUTANEOUS
  Filled 2016-08-19: qty 0.4

## 2016-08-19 NOTE — ED Notes (Signed)
Patient transported to Ultrasound 

## 2016-08-19 NOTE — ED Notes (Signed)
Patient given a warm blanket; visitor at bedside

## 2016-08-19 NOTE — ED Notes (Signed)
Back from MRI, admitting md at bedside.

## 2016-08-19 NOTE — Consult Note (Signed)
Referring Physician: Dr Jeneen Rinks    Chief Complaint: Right sided weakness and dysarthria  HPI: [name] is an 70 y.o. female with history of nonruptured cerebral aneurysms S/P endovascular treatment with placement of a pipeline stent in the left internal carotid artery on 07/23/2016 by Dr. Kathyrn Sheriff, residual cerebral aneurysm on the right, a previous TIA / stroke, frequent falls, orthostatic static hypotension, hypertension, hypothyroidism, anxiety, depression, ongoing tobacco use, hard of hearing, hyperglycemia, and hypothyroidism who presented to the emergency department for evaluation of right upper quadrant pain. While here she developed right upper extremity weakness, facial droop, and dysarthria. Neurology was asked to evaluate.  Last month, following her left carotid stent stent placement the patient developed right facial droop, right upper extremity weakness, and dysarthria. The patient was placed on aspirin and Plavix however she was intolerant to the Plavix and it was discontinued. She underwent outpatient therapies and showed significant improvement. Since that time the patient has had difficulties with abdominal pain and has been seen in emergency department on several occasions. She presented today for further evaluation and had an abdominal ultrasound as well as a chest CT with contrast to evaluate a possible nodule. The patient's blood pressure was noted to be low and she was given IV fluid hydration. She also received some morphine which may have contributed to her hypotension.   The patient's husband noted that the patient's speech was becoming more slurred and she was having increased right facial weakness. The patient also noted that her right upper extremity was weaker. She is not a candidate for TPA because of the stroke last month; however, it was felt that a CT angiogram could be considered to look for a large occluded vessel. Unfortunately the patient has already had IV contrast  for her CT of the chest. An MRI Head Wo Contrast is currently pending. We will await the results of the MRI and consider proceeding with the CT angiogram or catheter angiogram if felt to be indicated. In talking with the neuro radiologist he noted that since the patient's creatinine was 1.18 with a GFR of 46 it may still be safe to proceed with a CT angiogram. This was discussed with Dr. Cheral Marker and the emergency department physician. CT Chest with contrast today C/W primary lung cancer - new diagnosis.  Date last known well: Date: 08/19/2016 Time last known well: Time: 09:15 tPA Given: No: recent stroke  Past Medical History:  Diagnosis Date  . Anxiety   . Depression   . GERD (gastroesophageal reflux disease)    WOULD USE OTC MEDICATION IF NEEDED  . Hypothyroidism   . 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 Rosacea   . Tachycardia    PT STATES HER HEART RATE USUALLY ABOVE 100  . TIA (transient ischemic attack)     Past Surgical History:  Procedure Laterality Date  . BREAST BIOPSY     bilateral benign  . BUNIONECTOMY  2003   bilateral  . IR GENERIC HISTORICAL  06/05/2016   IR ANGIO VERTEBRAL SEL VERTEBRAL BILAT MOD SED 06/05/2016 Consuella Lose, MD [facility]  . IR GENERIC HISTORICAL  06/05/2016   IR ANGIO INTRA EXTRACRAN SEL INTERNAL CAROTID BILAT MOD SED 06/05/2016 Consuella Lose, MD [facility]  . IR GENERIC HISTORICAL  07/03/2016   IR ANGIOGRAM FOLLOW UP STUDY 07/03/2016 Consuella Lose, MD [facility]  . IR GENERIC HISTORICAL  07/03/2016   IR TRANSCATH/EMBOLIZ 07/03/2016 Consuella Lose, MD [facility]  . LUMBAR LAMINECTOMY/DECOMPRESSION  MICRODISCECTOMY Left 03/17/2013   Procedure: LUMBAR LAMINECTOMY/DECOMPRESSION IF RECESS STENOSIS FORAMINOTOMIE OF L4 ROOT AND L5,MICRODISECTOMY  L4-5 ON LEFT;  Surgeon: Tobi Bastos, MD;  Location: [facility];  Service: Orthopedics;  Laterality: Left;  . RADIOLOGY WITH ANESTHESIA N/A 07/03/2016   Procedure:  arteriogram, pipeline embolization of aneurysm;  Surgeon: Consuella Lose, MD;  Location: [facility];  Service: Radiology;  Laterality: N/A;  . TONSILLECTOMY  1953    Family History  Problem Relation Age of Onset  . Pancreatic cancer Brother   . Heart disease     Social History:  reports that she has been smoking Cigarettes.  She has been smoking about 0.50 packs per day. She has never used smokeless tobacco. She reports that she drinks alcohol. She reports that she does not use drugs.  Allergies:  Allergies  Allergen Reactions  . Plavix [Clopidogrel] Other (See Comments)    Affected concentration, arm movement, speech, and possibly caused numbness (right sided)  . Prednisone Nausea Only    Medications:  No current facility-administered medications for this encounter.    Current Outpatient Prescriptions  Medication Sig Dispense Refill  . amitriptyline (ELAVIL) 100 MG tablet TAKE 3 TABLETS BY MOUTH EVERY DAY (Patient taking differently: Take 300 mg by mouth at bedtime) 270 tablet 1  . Ascorbic Acid (VITAMIN C WITH ROSE HIPS) 1000 MG tablet Take 1,000 mg by mouth daily.    Marland Kitchen aspirin EC 325 MG tablet Take 325 mg by mouth every morning.   6  . DULoxetine (CYMBALTA) 30 MG capsule TAKE 1 CAPSULE (30 MG TOTAL) BY MOUTH DAILY. 30 capsule 0  . gabapentin (NEURONTIN) 300 MG capsule TAKE ONE CAPSULE IN THE DAYTIME AND 2 CAPSULES AT BEDTIME 270 capsule 0  . ibuprofen (ADVIL,MOTRIN) 200 MG tablet Take 400-600 mg by mouth every 6 (six) hours as needed (for pain).    Marland Kitchen levothyroxine (SYNTHROID) 75 MCG tablet TAKE 1 TABLET BY MOUTH DAILY BEFORE BREAKFAST. (Patient taking differently: Take 75 mcg by mouth daily before breakfast. ) 90 tablet 3  . polyethylene glycol (MIRALAX / GLYCOLAX) packet Take 17 g by mouth daily as needed (for constipation).        ROS: History obtained from the patient and husband  General ROS: negative for - chills, fatigue, fever, night sweats, weight gain or weight  loss Psychological ROS: negative for - behavioral disorder, hallucinations, memory difficulties, mood swings or suicidal ideation Ophthalmic ROS: negative for - blurry vision, double vision, eye pain or loss of vision ENT ROS: negative for - epistaxis, nasal discharge, oral lesions, sore throat, tinnitus or vertigo Allergy and Immunology ROS: negative for - hives or itchy/watery eyes Hematological and Lymphatic ROS: negative for - bleeding problems, bruising or swollen lymph nodes Endocrine ROS: negative for - galactorrhea, hair pattern changes, polydipsia/polyuria or temperature intolerance Respiratory ROS: negative for - cough, hemoptysis, shortness of breath or wheezing Cardiovascular ROS: negative for - chest pain, dyspnea on exertion, edema or irregular heartbeat Gastrointestinal ROS: negative for - Positive for abdominal pain and constipation. Genito-Urinary ROS: negative for - dysuria, hematuria, incontinence or urinary frequency/urgency Musculoskeletal ROS: negative for - joint swelling or muscular weakness Neurological ROS: as noted in HPI Dermatological ROS: negative for rash and skin lesion changes   Physical Examination: Blood pressure 123/93, pulse 97, temperature 97.8 F (36.6 C), temperature source Oral, resp. rate 16, SpO2 100 %.  Mental Status: Alert, oriented, thought content appropriate.  Very dysarthric. Difficult to understand at times. Able to follow 2 step  commands with cueing Cranial Nerves: II: Discs not visualized. Visual fields grossly normal, pupils equal, round, reactive to light and accommodation III,IV, VI: ptosis not present, extra-ocular motions intact bilaterally V,VII: smile asymmetric with right-sided weakness., facial light touch sensation normal bilaterally, unable to raise right eyebrow. VIII: hearing normal bilaterally IX,X: gag reflex present XI: bilateral shoulder shrug XII: midline tongue extension Motor: Right : Upper extremity   3/5    Left:      Upper extremity   5/5  Lower extremity   4/5     Lower extremity   5/5 Tone and bulk:normal tone throughout; no atrophy noted Sensory: Light touch intact throughout, bilaterally Deep Tendon Reflexes: Normoactive. No asymmetry noted.  Plantars:  Downgoing bilaterally.  Cerebellar: Normal finger-to-nose on the left with cueing. Unable to perform on the right secondary to weakness. Normal heel-to-shin test bilaterally. Gait: Deferred CV: pulses palpable but weak in both lower extremities.  Laboratory Studies:  Basic Metabolic Panel:  Recent Labs Lab 08/19/16 0915  NA 135  K 3.4*  CL 103  CO2 25  GLUCOSE 149*  BUN 9  CREATININE 1.18*  CALCIUM 8.8*    Liver Function Tests:  Recent Labs Lab 08/19/16 0915  AST 17  ALT 7*  ALKPHOS 82  BILITOT 0.1*  PROT 6.2*  ALBUMIN 2.7*    Recent Labs Lab 08/19/16 0915  LIPASE 52*   No results for input(s): AMMONIA in the last 168 hours.  CBC:  Recent Labs Lab 08/19/16 0915  WBC 10.2  NEUTROABS 7.9*  HGB 9.5*  HCT 30.0*  MCV 95.5  PLT 423*    Cardiac Enzymes: No results for input(s): CKTOTAL, CKMB, CKMBINDEX, TROPONINI in the last 168 hours.  BNP: Invalid input(s): POCBNP  CBG: No results for input(s): GLUCAP in the last 168 hours.  Microbiology: Results for orders placed or performed during the hospital encounter of 07/03/16  MRSA PCR Screening     Status: None   Collection Time: 07/03/16 11:51 PM  Result Value Ref Range Status   MRSA by PCR NEGATIVE NEGATIVE Final    Comment:        The GeneXpert MRSA Assay (FDA approved for NASAL specimens only), is one component of a comprehensive MRSA colonization surveillance program. It is not intended to diagnose MRSA infection nor to guide or monitor treatment for MRSA infections.     Coagulation Studies: No results for input(s): LABPROT, INR in the last 72 hours.  Urinalysis: No results for input(s): COLORURINE, LABSPEC, PHURINE, GLUCOSEU, HGBUR,  BILIRUBINUR, KETONESUR, PROTEINUR, UROBILINOGEN, NITRITE, LEUKOCYTESUR in the last 168 hours.  Invalid input(s): APPERANCEUR  Lipid Panel:    Component Value Date/Time   CHOL 162 08/13/2015 0754   TRIG 102.0 08/13/2015 0754   TRIG 75 07/12/2006 0818   HDL 58.90 08/13/2015 0754   CHOLHDL 3 08/13/2015 0754   VLDL 20.4 08/13/2015 0754   LDLCALC 82 08/13/2015 0754    HgbA1C:  Lab Results  Component Value Date   HGBA1C 5.5 06/18/2008    Urine Drug Screen:      Component Value Date/Time   LABOPIA NONE DETECTED 05/04/2014 1145   COCAINSCRNUR NONE DETECTED 05/04/2014 1145   LABBENZ NONE DETECTED 05/04/2014 1145   AMPHETMU NONE DETECTED 05/04/2014 1145   THCU NONE DETECTED 05/04/2014 1145   LABBARB NONE DETECTED 05/04/2014 1145    Alcohol Level: No results for input(s): ETH in the last 168 hours.  Other results: EKG: Sinus tachycardia rate 100 bpm. Please see formal cardiology reading for  complete details.  Imaging:  Ct Chest W Contrast 08/19/2016 1. Solid mass within the superior segment of the right lower lobe apparently extending into the posterior inferior right upper lobe as well as the mediastinum and adjacent paravertebral soft tissues consistent with primary lung carcinoma of 6.9 x 7.2 x 6.6 cm.  2. Metastatic involvement of T7 and T6 vertebral body with with some involvement of posterior elements at these levels.  3. Coronary artery calcifications.    Dg Abd Acute W/chest 08/19/2016 1. Right parahilar mass. Recommend CT of the chest with contrast for further evaluation.  2. Emphysema.  3. Fluid levels within nondilated loops of large and small bowel suggesting an adynamic ileus without obstruction.  4. Progressive degenerative change and scoliosis in the lumbar spine.    US Abdomen Limited Ruq 08/19/2016 Negative right upper quadrant ultrasound.    Assessment: 70 y.o. female with history of nonruptured cerebral aneurysms s/p placement of a pipeline stent in  the left internal carotid artery on 07/23/2016 by Dr. Kathyrn Sheriff, residual cerebral aneurysm on the right, a previous TIA / stroke, frequent falls, orthostatic static hypotension, hypertension, hypothyroidism, anxiety, depression, ongoing tobacco use, hard of hearing, hyperglycemia, and hypothyroidism who presented to the emergency department today for evaluation of right upper quadrant pain. While here she developed right upper extremity weakness, facial droop, and dysarthria. Stroke workup in progress. CT Chest with contrast C/W primary lung cancer. Metastatic involvement of T7 and T6 vertebral bodies also noted.   Stroke Risk Factors - hypercoagulable state, smoking and previous stroke.  Plan:  Ischemic Stroke PLAN  Anti Platelet Therapy - Continue ASA 325 mg daily. Add Aggrenox  MRI / MRA Head / Brain Wo Contrast - pending  Transthoracic Echocardiogram (if not performed in the past 6 months)  Fasting lipids  Hemoglobin A1c  Telemetry monitoring  Frequent neuro checks  Permissive hypertension (OK if < 220/120) but gradually normalize in 5-7 day.   Long-term BP goal normotensive  VTE prophylaxis  Physical, occupational therapy, and speech evaluations.   NPO until RN stroke swallow screen  Risk Facter Modification  Carotid ultrasound  MRI of cervical and thoracic spine to evaluate for possible metastatic lesions  May need to be pan-scanned to determine if there are additional metastases   Consider initiation of anticoagulation after lung biopsy if hypercoagulable state secondary to malignancy is strongly suspected  Lowry Ram [facility] Pager [phone] 08/19/2016, 3:40 PM  Electronically signed: Dr. Kerney Elbe

## 2016-08-19 NOTE — ED Notes (Signed)
Patient transported to CT 

## 2016-08-19 NOTE — ED Notes (Signed)
Patient transported to MRI 

## 2016-08-19 NOTE — H&P (Signed)
History and Physical    [name] GEX:[id number] DOB: 1946-04-11 DOA: 08/19/2016  PCP: [name], MD  Patient coming from: home  Chief Complaint:  Abdominal pain  70 y/o ? 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             -Complicated by perioperative unilateral right-sided weakness and ultimately found to have probably a stroke and was placed on aspirin and Plavix--Plavix was discontinued by the family as condition got worse and patient was recommended to get Effient but family did not fill this medication because of black box warnings Hypothyroidism LBP on Gabapentin-s/p l4-l5 discectomy Episodic confusion + chr headaches Prior diverticulosis on colonoscopy 09/2012 Former smoker roseacea   Recent visit to emergency room 07/23/2016 given a bolus of IV fluids and sent home as UA was negative for infection and this was felt to be the cause   She presents to the emergency room with sick there are significant abdominal pain. She's been having this for the past week to week and a half and was doubled over with pain and could not move because of the same. As visited the emergency room no dark no tarry stools No chest pain Some slight rash No blurred vision Has had weakness on right side of the body in the upper and lower extremity and has not been able to do much and was getting outpatient therapy as ordered by neurosurgeon      Family History:    [place]    daughter with anorexia    Family History of Arthritis    DM   Social History:         Occupation: Prior Advice worker       Current Smoker stopped x 3 weeks post surgery         [place]       ocassional alcohol       ED Course: On presentation to emergency room after receiving morphine patient had worsening of slurring of speech worsening of weakness on the right side and worsening lower extremity weakness   workup was significant for BUN/creatinine 9/1.18 abdomen  2.7 lipase 52 LFTs within normal limits WBC 10 heme 11 9.5 2 view chest x-ray showed a right parahilar mass and emphysema Nondilated loops of large and small bowel suggestive of adynamic ileus CT chest with contrast showed solid mass in the superior right upper lobe 6.9 and X7.2 at 6.6 and involvement of T7 and T6 vertebral bodies MRI brain showed small infarcts around left central sulcus and small acute remote infarcts left MCA border  Review of Systems: As per HPI otherwise 10 point review of systems negative.    Past Medical History:  Diagnosis Date  . Anxiety   . Depression   . GERD (gastroesophageal reflux disease)    WOULD USE OTC MEDICATION IF NEEDED  . Hypothyroidism   . 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 Rosacea   . Tachycardia    PT STATES HER HEART RATE USUALLY ABOVE 100  . TIA (transient ischemic attack)     Past Surgical History:  Procedure Laterality Date  . BREAST BIOPSY     bilateral benign  . BUNIONECTOMY  2003   bilateral  . IR GENERIC HISTORICAL  06/05/2016   IR ANGIO VERTEBRAL SEL VERTEBRAL BILAT MOD SED 06/05/2016 Consuella Lose, MD [facility]  . IR GENERIC HISTORICAL  06/05/2016   IR ANGIO INTRA EXTRACRAN SEL INTERNAL CAROTID BILAT  MOD SED 06/05/2016 Consuella Lose, MD [facility]  . IR GENERIC HISTORICAL  07/03/2016   IR ANGIOGRAM FOLLOW UP STUDY 07/03/2016 Consuella Lose, MD [facility]  . IR GENERIC HISTORICAL  07/03/2016   IR TRANSCATH/EMBOLIZ 07/03/2016 Consuella Lose, MD [facility]  . LUMBAR LAMINECTOMY/DECOMPRESSION MICRODISCECTOMY Left 03/17/2013   Procedure: LUMBAR LAMINECTOMY/DECOMPRESSION IF RECESS STENOSIS FORAMINOTOMIE OF L4 ROOT AND L5,MICRODISECTOMY  L4-5 ON LEFT;  Surgeon: Tobi Bastos, MD;  Location: [facility];  Service: Orthopedics;  Laterality: Left;  . RADIOLOGY WITH ANESTHESIA N/A 07/03/2016   Procedure: arteriogram, pipeline embolization of aneurysm;  Surgeon: Consuella Lose, MD;  Location:  [place];  Service: Radiology;  Laterality: N/A;  . TONSILLECTOMY  1953     reports that she has been smoking Cigarettes.  She has been smoking about 0.50 packs per day. She has never used smokeless tobacco. She reports that she drinks alcohol. She reports that she does not use drugs.  Allergies  Allergen Reactions  . Plavix [Clopidogrel] Other (See Comments)    Affected concentration, arm movement, speech, and possibly caused numbness (right sided)  . Prednisone Nausea Only    Family History  Problem Relation Age of Onset  . Pancreatic cancer Brother   . Heart disease       Prior to Admission medications   Medication Sig Start Date End Date Taking? Authorizing Provider  amitriptyline (ELAVIL) 100 MG tablet TAKE 3 TABLETS BY MOUTH EVERY DAY Patient taking differently: Take 300 mg by mouth at bedtime 04/08/16  Yes Burnis Medin, MD  Ascorbic Acid (VITAMIN C WITH ROSE HIPS) 1000 MG tablet Take 1,000 mg by mouth daily.   Yes Historical Provider, MD  aspirin EC 325 MG tablet Take 325 mg by mouth every morning.  06/15/16  Yes Historical Provider, MD  BIOTIN PO Take 1 tablet by mouth daily.   Yes Historical Provider, MD  diphenhydramine-acetaminophen (TYLENOL PM) 25-500 MG TABS tablet Take 1 tablet by mouth at bedtime.   Yes Historical Provider, MD  DULoxetine (CYMBALTA) 30 MG capsule TAKE 1 CAPSULE (30 MG TOTAL) BY MOUTH DAILY. 08/03/16  Yes Burnis Medin, MD  gabapentin (NEURONTIN) 300 MG capsule TAKE ONE CAPSULE IN THE DAYTIME AND 2 CAPSULES AT BEDTIME 07/28/16  Yes Burnis Medin, MD  ibuprofen (ADVIL,MOTRIN) 200 MG tablet Take 400-600 mg by mouth every 6 (six) hours as needed (for pain).   Yes Historical Provider, MD  levothyroxine (SYNTHROID) 75 MCG tablet TAKE 1 TABLET BY MOUTH DAILY BEFORE BREAKFAST. Patient taking differently: Take 75 mcg by mouth daily before breakfast.  02/17/16  Yes Burnis Medin, MD  polyethylene glycol (MIRALAX / GLYCOLAX) packet Take 17 g by mouth daily.    Yes  Historical Provider, MD  prasugrel (EFFIENT) 5 MG TABS tablet Take 5 mg by mouth daily. 08/05/16   Historical Provider, MD    Physical Exam: Vitals:   08/19/16 1315 08/19/16 1727 08/19/16 1804 08/19/16 1819  BP: 123/93 142/87  131/92  Pulse: 97 93  93  Resp: '16 17  17  '$ Temp:   99.2 F (37.3 C)   TempSrc:      SpO2: 100% 95%  94%      Constitutional: NAD, calm, comfortable Vitals:   08/19/16 1315 08/19/16 1727 08/19/16 1804 08/19/16 1819  BP: 123/93 142/87  131/92  Pulse: 97 93  93  Resp: '16 17  17  '$ Temp:   99.2 F (37.3 C)   TempSrc:      SpO2: 100%  95%  94%   On exam Significant droop to right-sided face Power bilaterally is unequal with weakness on the right side which is rapidly improving Pupils equally reactive Uvula is shifted slightly Abdomen soft no rebound no guarding Able to move left lower extremity able to raise right lower extremity without significant deficit Sensory to lower extremities is within normal limits however right upper extremity has   Labs on Admission: I have personally reviewed following labs and imaging studies  CBC:  Recent Labs Lab 08/19/16 0915  WBC 10.2  NEUTROABS 7.9*  HGB 9.5*  HCT 30.0*  MCV 95.5  PLT 376*   Basic Metabolic Panel:  Recent Labs Lab 08/19/16 0915  NA 135  K 3.4*  CL 103  CO2 25  GLUCOSE 149*  BUN 9  CREATININE 1.18*  CALCIUM 8.8*   GFR: CrCl cannot be calculated (Unknown ideal weight.). Liver Function Tests:  Recent Labs Lab 08/19/16 0915  AST 17  ALT 7*  ALKPHOS 82  BILITOT 0.1*  PROT 6.2*  ALBUMIN 2.7*    Recent Labs Lab 08/19/16 0915  LIPASE 52*   No results for input(s): AMMONIA in the last 168 hours. Coagulation Profile: No results for input(s): INR, PROTIME in the last 168 hours. Cardiac Enzymes: No results for input(s): CKTOTAL, CKMB, CKMBINDEX, TROPONINI in the last 168 hours. BNP (last 3 results) No results for input(s): PROBNP in the last 8760 hours. HbA1C: No  results for input(s): HGBA1C in the last 72 hours. CBG: No results for input(s): GLUCAP in the last 168 hours. Lipid Profile: No results for input(s): CHOL, HDL, LDLCALC, TRIG, CHOLHDL, LDLDIRECT in the last 72 hours. Thyroid Function Tests: No results for input(s): TSH, T4TOTAL, FREET4, T3FREE, THYROIDAB in the last 72 hours. Anemia Panel: No results for input(s): VITAMINB12, FOLATE, FERRITIN, TIBC, IRON, RETICCTPCT in the last 72 hours. Urine analysis:    Component Value Date/Time   COLORURINE YELLOW 07/23/2016 [place] 07/23/2016 1618   LABSPEC 1.014 07/23/2016 1618   PHURINE 7.5 07/23/2016 1618   GLUCOSEU NEGATIVE 07/23/2016 1618   GLUCOSEU NEGATIVE 06/18/2009 0821   HGBUR NEGATIVE 07/23/2016 1618   HGBUR negative 06/30/2010 0910   BILIRUBINUR NEGATIVE 07/23/2016 1618   BILIRUBINUR n 05/19/2016 1315   KETONESUR NEGATIVE 07/23/2016 1618   PROTEINUR NEGATIVE 07/23/2016 1618   UROBILINOGEN 0.2 05/19/2016 1315   UROBILINOGEN 0.2 05/04/2014 1145   NITRITE NEGATIVE 07/23/2016 1618   LEUKOCYTESUR NEGATIVE 07/23/2016 1618   Sepsis Labs: !!!!!!!!!!!!!!!!!!!!!!!!!!!!!!!!!!!!!!!!!!!! '@LABRCNTIP'$ (procalcitonin:4,lacticidven:4) )No results found for this or any previous visit (from the past 240 hour(s)).   Radiological Exams on Admission: Ct Chest W Contrast  Result Date: 08/19/2016 CLINICAL DATA:  Right hilar mass on chest x-ray, nausea, shortness of breath EXAM: CT CHEST WITH CONTRAST TECHNIQUE: Multidetector CT imaging of the chest was performed during intravenous contrast administration. CONTRAST:  63m ISOVUE-300 IOPAMIDOL (ISOVUE-300) INJECTION 61% COMPARISON:  Chest x-ray of 08/19/2016 FINDINGS: Cardiovascular: Coronary artery calcifications are noted diffusely, primarily involving the left anterior descending coronary artery. The heart is within normal limits in size. No pericardial effusion is seen. The mid ascending thoracic aorta measures 34 mm in diameter. The  pulmonary arteries opacify with no central abnormality noted. Mediastinum/Nodes: The mass within the superior segment right lower lobe extending in the posterior inferior right upper lobe does appear to extend into the mediastinum with loss of adjacent fat planes, suggesting direct mediastinal invasion. No definite adenopathy is seen although subcarinal nodes may well be present. Lungs/Pleura: The right  perihilar mass described on chest x-ray represents a large solid mass involving the superior segment of the right lower lobe in apparently invading into the posterior inferior aspect of the right upper lobe. In its largest axial plane this mass measures 6.9 x 7.2 cm with a height of approximately 6.6 cm. There may also be direct invasion of the local paravertebral soft tissues. This lesion is consistent with primary lung carcinoma. There is an 8 mm poorly defined opacity within the right upper lobe posteriorly worrisome for metastatic lesion. Some volume loss is present posteriorly in the right lower lobe. No lesion is seen throughout the left lung. Upper Abdomen: The portion of the upper abdomen that is visualized is unremarkable. Musculoskeletal: There is blastic involvement of T7 vertebral body and to a lesser degree T6 with some involvement of posterior elements consistent with bone metastases. IMPRESSION: 1. Solid mass within the superior segment of the right lower lobe apparently extending into the posterior inferior right upper lobe as well as the mediastinum and adjacent paravertebral soft tissues consistent with primary lung carcinoma of 6.9 x 7.2 x 6.6 cm. 2. Metastatic involvement of T7 and T6 vertebral body with with some involvement of posterior elements at these levels. 3. Coronary artery calcifications. Electronically Signed   By: Ivar Drape M.D.   On: 08/19/2016 13:45   Mr Brain Wo Contrast  Result Date: 08/19/2016 CLINICAL DATA:  Right hilar mass.  Right upper extremity weakness. EXAM: MRI HEAD  WITHOUT CONTRAST TECHNIQUE: Multiplanar, multiecho pulse sequences of the brain and surrounding structures were obtained without intravenous contrast. COMPARISON:  CTA head neck 04/2016.  Brain MRI 05/04/2014 FINDINGS: Brain: There is a cluster of acute infarcts in the cortical and subcortical left frontal parietal junction along the central sulcus. Punctate cortical infarct in the superior left temporal gyrus and in the left occipital parietal region. There are interval but chronic infarcts also seen in the anterior left frontal lobe, which likely accounts for nonacute blood products in the inferior left frontal lobe. Patient will need postcontrast imaging to evaluate the focus of hemorrhage and white matter changes in this patient with right hilar mass. Microvascular ischemic change in the bilateral cerebral white matter. Rounded pontine T2 hyperintensity on the right, with hemosiderin staining. The this could reflect a capillary telangiectasia or remote infarct. No evidence of subarachnoid hemorrhage. Vascular: Normal flow voids.  Known intracranial aneurysms. Skull and upper cervical spine: Negative Sinuses/Orbits: Negative IMPRESSION: 1. Cluster of small acute infarcts around the left central sulcus. There also small acute and remote infarcts in the left MCA border zones. Recommend angiographic follow-up in this patient with ipsilateral ICA to MCA stent. 2. Focus of nonacute blood products in the inferior left frontal cortex is likely post ischemic given #1. When appropriate, recommend postcontrast evaluation in this patient with right hilar mass. Electronically Signed   By: Monte Fantasia M.D.   On: 08/19/2016 17:17   Dg Abd Acute W/chest  Result Date: 08/19/2016 CLINICAL DATA:  Abdominal pain intermittently over the last 1 year. EXAM: DG ABDOMEN ACUTE W/ 1V CHEST COMPARISON:  Two-view chest x-ray 03/16/2013. FINDINGS: The heart size is normal. A right perihilar mass is suspected. Aortic atherosclerosis  is present. Emphysematous changes are again noted. No focal airspace disease is present. Fluid levels are present within nondilated loops of large and small bowel. The no obstruction or free air. Progressive scoliosis is present at L3-4 and L4-5 with asymmetric endplate change on the right at L3-4 and on the left  at L4-5. IMPRESSION: 1. Right parahilar mass. Recommend CT of the chest with contrast for further evaluation. 2. Emphysema. 3. Fluid levels within nondilated loops of large and small bowel suggesting an adynamic ileus without obstruction. 4. Progressive degenerative change and scoliosis in the lumbar spine. These results were called by telephone at the time of interpretation on 08/19/2016 at 11:59 am to Dr. Tanna Furry , who verbally acknowledged these results. Electronically Signed   By: San Morelle M.D.   On: 08/19/2016 11:59   US Abdomen Limited Ruq  Result Date: 08/19/2016 CLINICAL DATA:  70 year old female with right upper quadrant abdominal pain and nausea since yesterday. Initial encounter. EXAM: US ABDOMEN LIMITED - RIGHT UPPER QUADRANT COMPARISON:  CT Abdomen and Pelvis 07/23/2016 FINDINGS: Gallbladder: No gallstones or wall thickening visualized. No sonographic Murphy sign noted by sonographer. Common bile duct: Diameter: 5 mm, normal Liver: Background liver echogenicity is within normal limits. An apparent small echogenic focus with shadowing in the right liver on image 29 is not correlated on the recent CT Abdomen and Pelvis and is probably an artifact or pseudo-lesion. No discrete liver lesion. Other findings: Negative sonographic appearance of the visible right kidney (image 40). IMPRESSION: Negative right upper quadrant ultrasound. Electronically Signed   By: Genevie Ann M.D.   On: 08/19/2016 10:49    EKG: Independently reviewed. nad  Assessment/Plan Active Problems:   CVA (cerebral vascular accident) ([place])   Stroke (cerebrum) ([place])   Hypotension Unclear etiology  initially was given morphine We will hold any prior to admission blood pressure medications and allow permissive hypertension Place on IV saline 100 cc per hour overnight and reassess  Acute CVA Discussed with neurologist agrees that in the setting that patient has had this problem outside of window for any type of intervention We'll get MRA of the brain, Doppler ultrasounds as well as echocardiogram Recommends resumption of Plavix aspirin however Plavix will be held because patient may benefit from our evaluation of lung issues PTOT and social work input this hospitalization To resume Plavix on discharge after all procedures are done  Severe reflux/GERD Partial adynamic ileus without obstruction The patient was taking ibuprofen in addition to both aspirin and Plavix as an outpatient. These have been discontinued Expect that her abdominal issues will resolve--will give a small dose of a prokinetic Reglan to see if this helps We will also order an enema to see if this clears out her abdomen  New onset lung cancer? CT scan shows 6.9 X7.2X 6.6 mass which also has metastases INR evaluation has been requested and patient is only on aspirin May benefit from coordination with oncology navigator as an outpatient  Mild renal insufficiency ` Fluids 100 cc/hr Lab work a.m.    Nita Sells MD [facility] Pager [phone]  If 7PM-7AM, please contact night-coverage [email or web address] Password [email or web address]  08/19/2016, 7:08 PM

## 2016-08-19 NOTE — ED Provider Notes (Addendum)
[facility] DEPT Provider Note   CSN: [id number] Arrival date & time: 08/19/16  0808     History   Chief Complaint Chief Complaint  Patient presents with  . Abdominal Pain    HPI [name] is a 70 y.o. female. She presents with a complaint of abdominal pain.  Competent recent past medical history including 24 hour inpatient stay for intravascular coil embolization of known left internal carotid branch aneurysms. These were found to be increasing in size on follow-up MRI. She was asymptomatic to these. This was performed here on 11/30. Discharge the following day. That evening developed difficulty with speech and weakness of her right arm.  Ultimately, underwent outpatient rehabilitation and was felt to have returned back to close to normal. She did not have any recurrence of neurological symptoms until today, while present in the emergency room.  Her abdominal pain has been present for a few weeks even before her procedure. It is epigastric and right upper quadrant. It is present almost every morning. Sometimes awakens her in the early morning. It is not food related. Her appetite has been poor, but she does not have worsening of symptoms with food. She eats a bland diet. Cannot comment on any fatty food intolerance or pericolic symptoms. Epigastric pain and right upper quadrant pain to her back and shoulder burning and sharp.  She is not tried antiacids or taken PPIs or H2 blockers. History of ulcer include smoking, daily ibuprofen use, caffeine of 1-2 pots of coffee per day and recent hospitalization.    HPI  Past Medical History:  Diagnosis Date  . Anxiety   . Depression   . GERD (gastroesophageal reflux disease)    WOULD USE OTC MEDICATION IF NEEDED  . Hypothyroidism   . 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 Rosacea   . Tachycardia    PT STATES HER HEART RATE USUALLY ABOVE 100  . TIA (transient ischemic attack)     Patient Active Problem List   Diagnosis Date Noted  . Cerebral aneurysm, nonruptured 07/03/2016  . Right lower quadrant abdominal pain 05/20/2016  . Cervical spine disease 05/20/2016  . Frequent falls 05/05/2016  . Palpable abdominal aorta 10/11/2015  . Orthostatic hypotension 10/11/2015  . Hx of falling 08/21/2015  . Aneurysm, cerebral, nonruptured 02/12/2015  . Transient confusion 02/12/2015  . Altered mental status 06/05/2014  . New onset headache 06/05/2014  . Essential hypertension 06/05/2014  . Aneurysm ([place]) 06/05/2014  . Unsteady gait 04/30/2014  . Underweight 03/06/2014  . Borderline anemia 03/06/2014  . Other malaise and fatigue 01/01/2014  . Loss of weight 01/01/2014  . Stress 01/01/2014  . Medication management 10/06/2013  . History of back surgery 08/02/2013  . Radicular pain of left lower extremity 08/02/2013  . Elevated blood pressure reading 08/02/2013  . Encounter for Medicare annual wellness exam 08/02/2013  . Adjustment reaction with anxiety and depression 08/02/2013  . Hypothyroidism 08/02/2013  . Spinal stenosis, lumbar region, with neurogenic claudication 03/17/2013  . Herniated lumbar intervertebral disc 03/17/2013  . Wax in ear 07/19/2011  . Decreased hearing 07/19/2011  . TOBACCO USE, QUIT 06/24/2009  . ELEVATED BLOOD PRESSURE WITHOUT DIAGNOSIS OF HYPERTENSION 04/11/2009  . RHINITIS, CHRONIC 06/25/2008  . ACTINIC KERATOSIS, HEAD 06/25/2008  . HEARTBURN 09/12/2007  . LEG PAIN 06/27/2007  . OSTEOPOROSIS 06/27/2007  . Disturbance in sleep behavior 06/27/2007  . NUMBNESS 06/27/2007  . Edema 06/27/2007  . FASTING HYPERGLYCEMIA 06/27/2007  . HYPOTHYROIDISM 04/21/2007  . ROSACEA  04/21/2007  . Osteoarthritis 04/21/2007    Past Surgical History:  Procedure Laterality Date  . BREAST BIOPSY     bilateral benign  . BUNIONECTOMY  2003   bilateral  . IR GENERIC HISTORICAL  06/05/2016   IR ANGIO VERTEBRAL SEL VERTEBRAL BILAT MOD SED 06/05/2016 Consuella Lose, MD [facility]  .  IR GENERIC HISTORICAL  06/05/2016   IR ANGIO INTRA EXTRACRAN SEL INTERNAL CAROTID BILAT MOD SED 06/05/2016 Consuella Lose, MD [facility]  . IR GENERIC HISTORICAL  07/03/2016   IR ANGIOGRAM FOLLOW UP STUDY 07/03/2016 Consuella Lose, MD [facility]  . IR GENERIC HISTORICAL  07/03/2016   IR TRANSCATH/EMBOLIZ 07/03/2016 Consuella Lose, MD [facility]  . LUMBAR LAMINECTOMY/DECOMPRESSION MICRODISCECTOMY Left 03/17/2013   Procedure: LUMBAR LAMINECTOMY/DECOMPRESSION IF RECESS STENOSIS FORAMINOTOMIE OF L4 ROOT AND L5,MICRODISECTOMY  L4-5 ON LEFT;  Surgeon: Tobi Bastos, MD;  Location: [facility];  Service: Orthopedics;  Laterality: Left;  . RADIOLOGY WITH ANESTHESIA N/A 07/03/2016   Procedure: arteriogram, pipeline embolization of aneurysm;  Surgeon: Consuella Lose, MD;  Location: [facility];  Service: Radiology;  Laterality: N/A;  . TONSILLECTOMY  1953    OB History    Gravida Para Term Preterm AB Living   3 3           SAB TAB Ectopic Multiple Live Births                   Home Medications    Prior to Admission medications   Medication Sig Start Date End Date Taking? Authorizing Provider  amitriptyline (ELAVIL) 100 MG tablet TAKE 3 TABLETS BY MOUTH EVERY DAY Patient taking differently: Take 300 mg by mouth at bedtime 04/08/16   Burnis Medin, MD  Ascorbic Acid (VITAMIN C WITH ROSE HIPS) 1000 MG tablet Take 1,000 mg by mouth daily.    Historical Provider, MD  aspirin EC 325 MG tablet Take 325 mg by mouth every morning.  06/15/16   Historical Provider, MD  DULoxetine (CYMBALTA) 30 MG capsule TAKE 1 CAPSULE (30 MG TOTAL) BY MOUTH DAILY. 08/03/16   Burnis Medin, MD  gabapentin (NEURONTIN) 300 MG capsule TAKE ONE CAPSULE IN THE DAYTIME AND 2 CAPSULES AT BEDTIME 07/28/16   Burnis Medin, MD  ibuprofen (ADVIL,MOTRIN) 200 MG tablet Take 400-600 mg by mouth every 6 (six) hours as needed (for pain).    Historical Provider, MD  levothyroxine (SYNTHROID) 75 MCG tablet TAKE 1 TABLET BY  MOUTH DAILY BEFORE BREAKFAST. Patient taking differently: Take 75 mcg by mouth daily before breakfast.  02/17/16   Burnis Medin, MD  polyethylene glycol (MIRALAX / GLYCOLAX) packet Take 17 g by mouth daily as needed (for constipation).     Historical Provider, MD    Family History Family History  Problem Relation Age of Onset  . Pancreatic cancer Brother   . Heart disease      Social History Social History  Substance Use Topics  . Smoking status: Current Every Day Smoker    Packs/day: 0.50    Types: Cigarettes  . Smokeless tobacco: Never Used     Comment: back to tobacco 10 per day   . Alcohol use 0.0 oz/week     Allergies   Plavix [clopidogrel] and Prednisone   Review of Systems Review of Systems  Constitutional: Negative for appetite change, chills, diaphoresis, fatigue and fever.  HENT: Negative for mouth sores, sore throat and trouble swallowing.   Eyes: Negative for visual disturbance.  Respiratory: Negative for cough,  chest tightness and wheezing.   Cardiovascular: Negative for chest pain.  Gastrointestinal: Positive for abdominal pain and nausea. Negative for abdominal distention, diarrhea and vomiting.       No bloody stools. No dark stools.  Endocrine: Negative for polydipsia, polyphagia and polyuria.  Genitourinary: Negative for dysuria, frequency and hematuria.  Musculoskeletal: Negative for gait problem.  Skin: Negative for color change, pallor and rash.  Neurological: Positive for weakness. Negative for dizziness, syncope, light-headedness and headaches.       Facial weakness. Right arm weakness. Time of onset 11 AM today  Hematological: Does not bruise/bleed easily.  Psychiatric/Behavioral: Negative for behavioral problems and confusion.     Physical Exam Updated Vital Signs BP 111/78   Pulse 98   Temp 97.8 F (36.6 C) (Oral)   Resp 13   SpO2 91%   Physical Exam  Constitutional: She is oriented to person, place, and time. She appears  well-developed and well-nourished. No distress.  Thin elderly female. Awake and alert. Pleasant and interactive communicative  HENT:  Head: Normocephalic.  Eyes: Conjunctivae are normal. Pupils are equal, round, and reactive to light. No scleral icterus.  Neck: Normal range of motion. Neck supple. No thyromegaly present.  Cardiovascular: Normal rate and regular rhythm.  Exam reveals no gallop and no friction rub.   No murmur heard. Pulmonary/Chest: Effort normal and breath sounds normal. No respiratory distress. She has no wheezes. She has no rales.  Abdominal: Soft. Bowel sounds are normal. She exhibits no distension. There is no tenderness. There is no rebound.  Slight epigastric and upper abdominal tenderness. Negative Murphy sign clinically.  Musculoskeletal: Normal range of motion.  Neurological: She is alert and oriented to person, place, and time.  Family story to the room. Using upper extremities normally. No noted asymmetry of smile or speech difficulty. 8 normally this morning without dysphasia.  Skin: Skin is warm and dry. No rash noted.  Psychiatric: She has a normal mood and affect. Her behavior is normal.     ED Treatments / Results  Labs (all labs ordered are listed, but only abnormal results are displayed) Labs Reviewed  CBC WITH DIFFERENTIAL/PLATELET - Abnormal; Notable for the following:       Result Value   RBC 3.14 (*)    Hemoglobin 9.5 (*)    HCT 30.0 (*)    Platelets 423 (*)    Neutro Abs 7.9 (*)    All other components within normal limits  COMPREHENSIVE METABOLIC PANEL - Abnormal; Notable for the following:    Potassium 3.4 (*)    Glucose, Bld 149 (*)    Creatinine, Ser 1.18 (*)    Calcium 8.8 (*)    Total Protein 6.2 (*)    Albumin 2.7 (*)    ALT 7 (*)    Total Bilirubin 0.1 (*)    GFR calc non Af Amer 46 (*)    GFR calc Af Amer 53 (*)    All other components within normal limits  LIPASE, BLOOD - Abnormal; Notable for the following:    Lipase 52  (*)    All other components within normal limits    EKG  EKG Interpretation None       Radiology Dg Abd Acute W/chest  Result Date: 08/19/2016 CLINICAL DATA:  Abdominal pain intermittently over the last 1 year. EXAM: DG ABDOMEN ACUTE W/ 1V CHEST COMPARISON:  Two-view chest x-ray 03/16/2013. FINDINGS: The heart size is normal. A right perihilar mass is suspected. Aortic atherosclerosis is present.  Emphysematous changes are again noted. No focal airspace disease is present. Fluid levels are present within nondilated loops of large and small bowel. The no obstruction or free air. Progressive scoliosis is present at L3-4 and L4-5 with asymmetric endplate change on the right at L3-4 and on the left at L4-5. IMPRESSION: 1. Right parahilar mass. Recommend CT of the chest with contrast for further evaluation. 2. Emphysema. 3. Fluid levels within nondilated loops of large and small bowel suggesting an adynamic ileus without obstruction. 4. Progressive degenerative change and scoliosis in the lumbar spine. These results were called by telephone at the time of interpretation on 08/19/2016 at 11:59 am to Dr. Tanna Furry , who verbally acknowledged these results. Electronically Signed   By: San Morelle M.D.   On: 08/19/2016 11:59   US Abdomen Limited Ruq  Result Date: 08/19/2016 CLINICAL DATA:  70 year old female with right upper quadrant abdominal pain and nausea since yesterday. Initial encounter. EXAM: US ABDOMEN LIMITED - RIGHT UPPER QUADRANT COMPARISON:  CT Abdomen and Pelvis 07/23/2016 FINDINGS: Gallbladder: No gallstones or wall thickening visualized. No sonographic Murphy sign noted by sonographer. Common bile duct: Diameter: 5 mm, normal Liver: Background liver echogenicity is within normal limits. An apparent small echogenic focus with shadowing in the right liver on image 29 is not correlated on the recent CT Abdomen and Pelvis and is probably an artifact or pseudo-lesion. No discrete liver  lesion. Other findings: Negative sonographic appearance of the visible right kidney (image 40). IMPRESSION: Negative right upper quadrant ultrasound. Electronically Signed   By: Genevie Ann M.D.   On: 08/19/2016 10:49    Procedures Procedures (including critical care time)  Medications Ordered in ED Medications  morphine 4 MG/ML injection 2 mg (2 mg Intravenous Given 08/19/16 0913)  ondansetron (ZOFRAN) injection 4 mg (4 mg Intravenous Given 08/19/16 0911)  sodium chloride 0.9 % bolus 500 mL (0 mLs Intravenous Stopped 08/19/16 1046)  pantoprazole (PROTONIX) injection 40 mg (40 mg Intravenous Given 08/19/16 0909)  sodium chloride 0.9 % bolus 500 mL (0 mLs Intravenous Stopped 08/19/16 1156)  gi cocktail (Maalox,Lidocaine,Donnatal) (30 mLs Oral Given 08/19/16 1102)  sucralfate (CARAFATE) tablet 1 g (1 g Oral Given 08/19/16 1102)     Initial Impression / Assessment and Plan / ED Course  I have reviewed the triage vital signs and the nursing notes.  Pertinent labs & imaging results that were available during my care of the patient were reviewed by me and considered in my medical decision making (see chart for details).  Clinical Course     11:15:  Informed by nurse, that patient and husband felt the patient had right arm weakness and right facial droop. I reevaluated the patient. Husband felt that this became evident after given pain medication. I long discussion and examination with patient and husband. Husband feels that her right facial droop was worse when it started at 11 AM. He feels like it is better now but not back to her baseline that she had after her procedure. He states that he gave her water and it "drooled out of her mouth". Nurse's given by mouth Carafate, and GI cocktail since that time and patient swallowed without dysphasia and is not drooling on exam now. On exam has subtle right facial droop and subtle right pronator drift. Patient and husband are uncertain, however they feel  this "might" be a little worse and was when she came home.  I discussed the case with neurologist on call Dr. Cheral Marker.  I  appreciate his input. I asked whether or not she was an acute intravenous lytic therapy candidate. He felt that with her recent endovascular procedure this would contraindicate this. He recommended discussion with family about possibility of endovascular procedure if workup showed sign of acute abnormalities based on recent procedure.  Also of note, patient has hilar mass in her right lung on plain film x-ray of her chest. She will need CT with contrast of her chest.  Final Clinical Impressions(s) / ED Diagnoses   Final diagnoses:  Abdominal pain    Patient seen by neurology services. They recommended MRI. If the CT angiogram to rule out intravascular abnormality related to her recent procedure that would explain new right neurological symptoms. CT of her chest was ordered. I requested chest x-ray as in review of her last admission abdominal CT showed a pleural effusion. She is a heavy smoker. Forcefully this shows a large right hilar based lung mass with thoracic spinal metastases.  In review her CT from 2 weeks ago other no sign of lower thoracic or lumbar metastases or other obvious intraoral abdominal metastases.  In summary at this point patient's problem list includes:  1)  abdominal pain-likely peptic ulcer related. She has multiple risk factors. She has negative color ultrasound and negative enzymes. She has a.m. pain. She had no relief with morphine here, but had immediate relief with Protonix, GI cocktail, and Carafate. If this were right-sided problems this could potentially be treated outpatient with GI follow-up.  2)  new diagnosis right lung tumor with pulmonary mass, spinal metastases, and malignant right pleural effusion. Will need oncology follow-up and consideration for biopsy either via CT-guided biopsy, bronchoscopy.  3)  new/recurrent left hemispheric  CVA symptoms. She has recurrent facial droop and right arm droop that occurred while in the emergency room. My CT angiogram pending. Patient not IV TPA candidate due to recent intravascular procedure.  A long discussion with family. They do express some did not feel so they got immediate follow-up after patient was discharged. Husband had stopped her Plavix for greater than 1 week concerned that it was making her symptoms worse.espirations patient underwent only outpatient therapy.   I would prefer inpatient stroke team evaluation. He'll have to pursue oncology, pulmonary, GI evaluation as her neurological symptoms become more clear and stable.   I discussed the patient on 2 different occasions with neurologist. Has been seen in the emergency room by neurology. Patient had low blood pressures upon her arrival. Was given aggressive IV fluids. She's been greater than 95. In review of her chart she is often around 95 systolic and she is a very small stature. Creatinine slightly elevated at 1.18. Had been given CT contrast for her CT of her chest. Neurology PA discussed patient with radiologist. A radiologist felt confident the patient could've low-dose additional IV contrast for her CT angiogram of her brain. She has been aggressively hydrated. She will need additional postprocedure hydration to ensure preservation of her kidney function.   New Prescriptions New Prescriptions   No medications on file     Tanna Furry, MD 08/19/16 1601    Tanna Furry, MD 08/19/16 [phone]

## 2016-08-19 NOTE — ED Notes (Signed)
Patient has returned from being out of the department; placed patient back on monitor, continuous pulse oximetry and blood pressure cuff; visitor at bedside

## 2016-08-19 NOTE — ED Notes (Signed)
Attempted report 

## 2016-08-19 NOTE — ED Triage Notes (Signed)
Pt reports RUQ pain that started yesterday but resolved, returned this am 0400. Having nausea, no vomiting or diarrhea. Last bm was yesterday.

## 2016-08-20 ENCOUNTER — Ambulatory Visit: Payer: Medicare Other | Admitting: Physical Therapy

## 2016-08-20 ENCOUNTER — Inpatient Hospital Stay (HOSPITAL_COMMUNITY): Payer: Medicare Other

## 2016-08-20 ENCOUNTER — Encounter (HOSPITAL_COMMUNITY): Payer: Self-pay | Admitting: *Deleted

## 2016-08-20 ENCOUNTER — Encounter (HOSPITAL_COMMUNITY): Payer: Medicare Other

## 2016-08-20 ENCOUNTER — Encounter (HOSPITAL_COMMUNITY): Payer: Self-pay | Admitting: General Practice

## 2016-08-20 DIAGNOSIS — I63312 Cerebral infarction due to thrombosis of left middle cerebral artery: Secondary | ICD-10-CM

## 2016-08-20 LAB — ECHOCARDIOGRAM COMPLETE
E decel time: 211 msec
EERAT: 6.4
FS: 26 % — AB (ref 28–44)
IV/PV OW: 0.99
LA diam end sys: 29 mm
LA vol A4C: 25.2 ml
LA vol index: 24.8 mL/m2
LA vol: 35.9 mL
LADIAMINDEX: 2.01 cm/m2
LASIZE: 29 mm
LVEEAVG: 6.4
LVEEMED: 6.4
LVELAT: 11 cm/s
LVOT SV: 73 mL
LVOT VTI: 23.2 cm
LVOT area: 3.14 cm2
LVOT peak vel: 93 cm/s
LVOTD: 20 mm
MV Dec: 211
MVPKAVEL: 101 m/s
MVPKEVEL: 70.4 m/s
PW: 9.62 mm — AB (ref 0.6–1.1)
Reg peak vel: 246 cm/s
TAPSE: 23.3 mm
TDI e' lateral: 11
TDI e' medial: 6.97
TRMAXVEL: 246 cm/s
WEIGHTICAEL: 1588.8 [oz_av]

## 2016-08-20 LAB — LIPID PANEL
Cholesterol: 139 mg/dL (ref 0–200)
HDL: 51 mg/dL (ref >40)
LDL Cholesterol: 74 mg/dL (ref 0–99)
Total CHOL/HDL Ratio: 2.7 RATIO
Triglycerides: 72 mg/dL (ref <150)
VLDL: 14 mg/dL (ref 0–40)

## 2016-08-20 LAB — PROTIME-INR
INR: 1.06
Prothrombin Time: 13.8 seconds (ref 11.4–15.2)

## 2016-08-20 LAB — APTT: aPTT: 40 seconds — ABNORMAL HIGH (ref 24–36)

## 2016-08-20 MED ORDER — TRAMADOL HCL 50 MG PO TABS
50.0000 mg | ORAL_TABLET | Freq: Four times a day (QID) | ORAL | Status: DC
  Administered 2016-08-20 – 2016-08-21 (×4): 50 mg via ORAL
  Filled 2016-08-20 (×5): qty 1

## 2016-08-20 MED ORDER — SUCRALFATE 1 G PO TABS
1.0000 g | ORAL_TABLET | Freq: Three times a day (TID) | ORAL | Status: DC
  Administered 2016-08-20 – 2016-08-21 (×4): 1 g via ORAL
  Filled 2016-08-20 (×4): qty 1

## 2016-08-20 MED ORDER — ENOXAPARIN SODIUM 40 MG/0.4ML ~~LOC~~ SOLN: 40.0000 mg | Freq: Every day | SUBCUTANEOUS | Status: DC

## 2016-08-20 MED ORDER — TRAMADOL HCL 50 MG PO TABS
50.0000 mg | ORAL_TABLET | Freq: Once | ORAL | Status: AC
  Administered 2016-08-20: 50 mg via ORAL
  Filled 2016-08-20: qty 1

## 2016-08-20 MED ORDER — PRAVASTATIN SODIUM 20 MG PO TABS: 20.0000 mg | ORAL_TABLET | Freq: Every day | ORAL | Status: DC

## 2016-08-20 MED ORDER — ENOXAPARIN SODIUM 40 MG/0.4ML ~~LOC~~ SOLN
40.0000 mg | Freq: Every day | SUBCUTANEOUS | Status: DC
  Administered 2016-08-20: 40 mg via SUBCUTANEOUS
  Filled 2016-08-20: qty 0.4

## 2016-08-20 MED ORDER — PANTOPRAZOLE SODIUM 40 MG PO TBEC
40.0000 mg | DELAYED_RELEASE_TABLET | Freq: Two times a day (BID) | ORAL | Status: DC
  Administered 2016-08-20 – 2016-08-21 (×3): 40 mg via ORAL
  Filled 2016-08-20 (×3): qty 1

## 2016-08-20 MED ORDER — ENSURE ENLIVE PO LIQD
237.0000 mL | Freq: Two times a day (BID) | ORAL | Status: DC
  Administered 2016-08-20: 237 mL via ORAL

## 2016-08-20 NOTE — Progress Notes (Signed)
PROGRESS NOTE    KELLEEN STOLZE  VQQ:[id number] DOB: February 07, 1946 DOA: 08/19/2016 PCP: [name], MD    Brief Narrative:  70 y/o ? 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 [id number]/9518 Complication of probable CVA perioperatively Hypothyroidism Low back pain on gabapentin S/PL4-L5 discectomy Episodic confusion, chronic headaches  Admitted with abdominal pain from emergency room and then started having strokelike symptoms Neurology emergently consulted MRI showed multiple infarcts left hemisphere the patient was outside any type of operative window  Assessment & Plan:   Active Problems:   CVA (cerebral vascular accident) ([place])   Stroke (cerebrum) (H[facility])   Hypotension which has resolved Unclear etiology-- initially was given morphine Continue holding blood pressure medications and allow permissive hypertension or now Continue IV saline 100 cc per hour overnight and reassess  Acute CVA Discussed with neurologist agrees that in the setting that patient has had this problem outside of window for any type of intervention MRI brain 1227 shows small infarcts around left central sulcus and remote infarcts L MCA MRA brain performed shows left internal carotid artery pipeline stent patency not accurately determine about the MR no emergent large vessel occlusion and 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 CT scan shows 6.9 X7.2X 6.6 mass which also has metastases IR evaluation has been requested and patient is only on  aspirin Patient will prop the head of procedure performed 1229 2017 is nothing by mouth May benefit from coordination with oncology navigator as an outpatient  Mild renal insufficiency           ` Fluids 100 cc/hr Lab work a.m.   DVT prophylaxis: Lovenox Code Status: Full Family Communication: Discussed with husband and daughters at the bedside Disposition Plan: Await further workup   Consultants:   Neurology  Pulmonology  Interventional radiology  Procedures:     Antimicrobials:      Subjective: Alert pleasant oriented deficits have resolved and no other issues at resident time feeling fair had a diet and was tolerating it  Objective: Vitals:   08/20/16 0645 08/20/16 0715 08/20/16 0900 08/20/16 1330  BP: 130/72 119/74 (!) 154/89   Pulse: 93  93   Resp: '16 18 18   '$ Temp: 98.7 F (37.1 C) 98.2 F (36.8 C) 97.5 F (36.4 C)   TempSrc: Oral Oral Axillary   SpO2: 96% 98% 97%   Weight:   48 kg (105 lb 12.8 oz) 45 kg (99 lb 4.8 oz)   No intake or output data in the 24 hours ending 08/20/16 1342 Filed Weights   08/20/16 0900 08/20/16 1330  Weight: 48 kg (105 lb 12.8 oz) 45 kg (99 lb 4.8 oz)    Examination:  General exam: Appears calm and comfortable, deficits have resolved Respiratory system: Clear to auscultation. Respiratory effort normal. Cardiovascular system: S1 & S2 heard, RRR. No JVD, murmurs, rubs, gallops or clicks. No pedal edema. Gastrointestinal system: Abdomen is nondistended, soft and nontender. No rebound no gaurd Central nervous system: Alert and oriented. No focal neurological deficits. Extremities: Symmetric 5 x 5 power. Skin: No rashes to my visualization Psychiatry: Judgement  and insight appear normal. Mood & affect appropriate.     Data Reviewed: I have personally reviewed following labs and imaging studies  CBC:  Recent Labs Lab 08/19/16 0915  WBC 10.2  NEUTROABS 7.9*  HGB 9.5*  HCT 30.0*  MCV 95.5  PLT 423*   Basic  Metabolic Panel:  Recent Labs Lab 08/19/16 0915  NA 135  K 3.4*  CL 103  CO2 25  GLUCOSE 149*  BUN 9  CREATININE 1.18*  CALCIUM 8.8*   GFR: Estimated Creatinine Clearance: 32 mL/min (by C-G formula based on SCr of 1.18 mg/dL (H)). Liver Function Tests:  Recent Labs Lab 08/19/16 0915  AST 17  ALT 7*  ALKPHOS 82  BILITOT 0.1*  PROT 6.2*  ALBUMIN 2.7*    Recent Labs Lab 08/19/16 0915  LIPASE 52*   No results for input(s): AMMONIA in the last 168 hours. Coagulation Profile:  Recent Labs Lab 08/20/16 0718  INR 1.06   Cardiac Enzymes: No results for input(s): CKTOTAL, CKMB, CKMBINDEX, TROPONINI in the last 168 hours. BNP (last 3 results) No results for input(s): PROBNP in the last 8760 hours. HbA1C: No results for input(s): HGBA1C in the last 72 hours. CBG: No results for input(s): GLUCAP in the last 168 hours. Lipid Profile:  Recent Labs  08/20/16 0718  CHOL 139  HDL 51  LDLCALC 74  TRIG 72  CHOLHDL 2.7   Thyroid Function Tests: No results for input(s): TSH, T4TOTAL, FREET4, T3FREE, THYROIDAB in the last 72 hours. Anemia Panel: No results for input(s): VITAMINB12, FOLATE, FERRITIN, TIBC, IRON, RETICCTPCT in the last 72 hours. Sepsis Labs: No results for input(s): PROCALCITON, LATICACIDVEN in the last 168 hours.  No results found for this or any previous visit (from the past 240 hour(s)).       Radiology Studies: Ct Chest W Contrast  Result Date: 08/19/2016 CLINICAL DATA:  Right hilar mass on chest x-ray, nausea, shortness of breath EXAM: CT CHEST WITH CONTRAST TECHNIQUE: Multidetector CT imaging of the chest was performed during intravenous contrast administration. CONTRAST:  9m ISOVUE-300 IOPAMIDOL (ISOVUE-300) INJECTION 61% COMPARISON:  Chest x-ray of 08/19/2016 FINDINGS: Cardiovascular: Coronary artery calcifications are noted diffusely, primarily involving the left anterior descending coronary artery. The heart is within normal limits in  size. No pericardial effusion is seen. The mid ascending thoracic aorta measures 34 mm in diameter. The pulmonary arteries opacify with no central abnormality noted. Mediastinum/Nodes: The mass within the superior segment right lower lobe extending in the posterior inferior right upper lobe does appear to extend into the mediastinum with loss of adjacent fat planes, suggesting direct mediastinal invasion. No definite adenopathy is seen although subcarinal nodes may well be present. Lungs/Pleura: The right perihilar mass described on chest x-ray represents a large solid mass involving the superior segment of the right lower lobe in apparently invading into the posterior inferior aspect of the right upper lobe. In its largest axial plane this mass measures 6.9 x 7.2 cm with a height of approximately 6.6 cm. There may also be direct invasion of the local paravertebral soft tissues. This lesion is consistent with primary lung carcinoma. There is an 8 mm poorly defined opacity within the right upper lobe posteriorly worrisome for metastatic lesion. Some volume loss is present posteriorly in the right lower lobe. No lesion is seen throughout the left lung. Upper Abdomen: The portion of the upper abdomen that is visualized is unremarkable. Musculoskeletal: There is blastic involvement of T7 vertebral body and to a lesser degree  T6 with some involvement of posterior elements consistent with bone metastases. IMPRESSION: 1. Solid mass within the superior segment of the right lower lobe apparently extending into the posterior inferior right upper lobe as well as the mediastinum and adjacent paravertebral soft tissues consistent with primary lung carcinoma of 6.9 x 7.2 x 6.6 cm. 2. Metastatic involvement of T7 and T6 vertebral body with with some involvement of posterior elements at these levels. 3. Coronary artery calcifications. Electronically Signed   By: Ivar Drape M.D.   On: 08/19/2016 13:45   Mr Jodene Nam Neck W Wo  Contrast  Result Date: 08/19/2016 CLINICAL DATA:  Follow-up acute LEFT MCA territory infarct. 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 EXAM: MRA NECK WITHOUT AND WITH CONTRAST MRA HEAD WITHOUT CONTRAST MRI HEAD WITH CONTRAST TECHNIQUE: Multiplanar and multiecho pulse sequences of the neck were obtained without and with intravenous contrast. Angiographic images of the neck were obtained using MRA technique without and with intravenous contrast.; Angiographic images of the Circle of Willis were obtained using MRA technique without intravenous contrast. Multiplanar multi P pulse sequences of the head obtained with contrast. CONTRAST:  10m MULTIHANCE GADOBENATE DIMEGLUMINE 529 MG/ML IV SOLN COMPARISON:  CT angiogram of the head May 13, 2016 and MRI head August 19, 2016 at 1646 hours FINDINGS: MRI HEAD FINDINGS No abnormal parenchymal enhancement with particular attention LEFT inferior frontal lobe at site of susceptibility artifact. No abnormal extra-axial enhancement. MRA NECK FINDINGS ANTERIOR CIRCULATION: 2 vessel arch. The common carotid arteries are widely patent bilaterally. The carotid bifurcation is patent bilaterally and there is no hemodynamically significant carotid stenosis by NASCET criteria. Tortuous RIGHT internal carotid artery. No evidence for atherosclerosis or flow limiting stenosis of the cervical internal carotid arteries. POSTERIOR CIRCULATION: Bilateral vertebral arteries are patent to the vertebrobasilar junction. Tortuous vessels. No evidence for atherosclerosis or flow limiting stenosis. Source images and MIP image were reviewed. MRA HEAD FINDINGS ANTERIOR CIRCULATION: Normal flow related enhancement of the included cervical, petrous, RIGHT cavernous and RIGHT supraclinoid internal carotid arteries. Loss of signal LEFT cavernous and supraclinoid internal carotid artery at level of pipeline stent. 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 Patent anterior communicating artery. Normal flow related enhancement of the anterior and middle cerebral arteries, including distal segments. POSTERIOR CIRCULATION: Codominant vertebral artery's. Basilar artery is patent, with normal flow related enhancement of the main branch vessels. Fenestrated proximal basilar artery. Bilateral posterior communicating artery present. Normal flow related enhancement of the posterior cerebral arteries. No large vessel occlusion, high-grade stenosis, abnormal luminal irregularity, aneurysm. IMPRESSION: MRI HEAD: No abnormal enhancement. MRA HEAD: LEFT internal carotid artery pipeline stent, patency cannot accurately be determined by MR. No emergent large vessel occlusion. 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 MRA NECK:  No hemodynamically significant stenosis. Electronically Signed   By: CElon AlasM.D.   On: 08/19/2016 21:50   Mr Brain Wo Contrast  Result Date: 08/19/2016 CLINICAL DATA:  Right hilar mass.  Right upper extremity weakness. EXAM: MRI HEAD WITHOUT CONTRAST TECHNIQUE: Multiplanar, multiecho pulse sequences of the brain and surrounding structures were obtained without intravenous contrast. COMPARISON:  CTA head neck 04/2016.  Brain MRI 05/04/2014 FINDINGS: Brain: There is a cluster of acute infarcts in the cortical and subcortical left frontal parietal junction along the central sulcus. Punctate cortical infarct in the superior left temporal gyrus and in the left occipital parietal region. There are interval but chronic infarcts also seen  in the anterior left frontal lobe, which likely accounts for nonacute blood products in the inferior left frontal lobe. Patient will need postcontrast imaging to evaluate the focus of hemorrhage and white matter changes in this patient with right hilar mass. Microvascular ischemic change in the bilateral cerebral white matter. Rounded  pontine T2 hyperintensity on the right, with hemosiderin staining. The this could reflect a capillary telangiectasia or remote infarct. No evidence of subarachnoid hemorrhage. Vascular: Normal flow voids.  Known intracranial aneurysms. Skull and upper cervical spine: Negative Sinuses/Orbits: Negative IMPRESSION: 1. Cluster of small acute infarcts around the left central sulcus. There also small acute and remote infarcts in the left MCA border zones. Recommend angiographic follow-up in this patient with ipsilateral ICA to MCA stent. 2. Focus of nonacute blood products in the inferior left frontal cortex is likely post ischemic given #1. When appropriate, recommend postcontrast evaluation in this patient with right hilar mass. Electronically Signed   By: Monte Fantasia M.D.   On: 08/19/2016 17:17   Mr Brain W Contrast  Result Date: 08/19/2016 CLINICAL DATA:  Follow-up acute LEFT MCA territory infarct. 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 EXAM: MRA NECK WITHOUT AND WITH CONTRAST MRA HEAD WITHOUT CONTRAST MRI HEAD WITH CONTRAST TECHNIQUE: Multiplanar and multiecho pulse sequences of the neck were obtained without and with intravenous contrast. Angiographic images of the neck were obtained using MRA technique without and with intravenous contrast.; Angiographic images of the Circle of Willis were obtained using MRA technique without intravenous contrast. Multiplanar multi P pulse sequences of the head obtained with contrast. CONTRAST:  18m MULTIHANCE GADOBENATE DIMEGLUMINE 529 MG/ML IV SOLN COMPARISON:  CT angiogram of the head May 13, 2016 and MRI head August 19, 2016 at 1646 hours FINDINGS: MRI HEAD FINDINGS No abnormal parenchymal enhancement with particular attention LEFT inferior frontal lobe at site of susceptibility artifact. No abnormal extra-axial enhancement. MRA NECK FINDINGS ANTERIOR CIRCULATION: 2 vessel arch. The common carotid arteries are widely patent  bilaterally. The carotid bifurcation is patent bilaterally and there is no hemodynamically significant carotid stenosis by NASCET criteria. Tortuous RIGHT internal carotid artery. No evidence for atherosclerosis or flow limiting stenosis of the cervical internal carotid arteries. POSTERIOR CIRCULATION: Bilateral vertebral arteries are patent to the vertebrobasilar junction. Tortuous vessels. No evidence for atherosclerosis or flow limiting stenosis. Source images and MIP image were reviewed. MRA HEAD FINDINGS ANTERIOR CIRCULATION: Normal flow related enhancement of the included cervical, petrous, RIGHT cavernous and RIGHT supraclinoid internal carotid arteries. Loss of signal LEFT cavernous and supraclinoid internal carotid artery at level of pipeline stent. 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 Patent anterior communicating artery. Normal flow related enhancement of the anterior and middle cerebral arteries, including distal segments. POSTERIOR CIRCULATION: Codominant vertebral artery's. Basilar artery is patent, with normal flow related enhancement of the main branch vessels. Fenestrated proximal basilar artery. Bilateral posterior communicating artery present. Normal flow related enhancement of the posterior cerebral arteries. No large vessel occlusion, high-grade stenosis, abnormal luminal irregularity, aneurysm. IMPRESSION: MRI HEAD: No abnormal enhancement. MRA HEAD: LEFT internal carotid artery pipeline stent, patency cannot accurately be determined by MR. No emergent large vessel occlusion. 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 MRA NECK:  No hemodynamically significant stenosis. Electronically Signed   By: CElon AlasM.D.   On: 08/19/2016 21:50   Dg Abd 2 Views  Result Date: 08/19/2016 CLINICAL DATA:  Small-bowel obstruction EXAM: ABDOMEN - 2 VIEW  COMPARISON:  1134 hours on the same day FINDINGS:  Scattered nondistended air containing small bowel loops with a few scattered air-fluid levels are again noted suggesting adynamic ileus contrast opacifies the urinary bladder. There is lower lumbar degenerative disc disease from L3 through S1. No free air is identified. Right hilar mass is again seen. IMPRESSION: No significant change in the appearance of the bowel gas pattern possibly representing adynamic ileus given scattered air containing nondistended small bowel loops with scattered air-fluid levels. Right hilar masslike abnormality is again seen. Electronically Signed   By: Ashley Royalty M.D.   On: 08/19/2016 21:40   Dg Abd Acute W/chest  Result Date: 08/19/2016 CLINICAL DATA:  Abdominal pain intermittently over the last 1 year. EXAM: DG ABDOMEN ACUTE W/ 1V CHEST COMPARISON:  Two-view chest x-ray 03/16/2013. FINDINGS: The heart size is normal. A right perihilar mass is suspected. Aortic atherosclerosis is present. Emphysematous changes are again noted. No focal airspace disease is present. Fluid levels are present within nondilated loops of large and small bowel. The no obstruction or free air. Progressive scoliosis is present at L3-4 and L4-5 with asymmetric endplate change on the right at L3-4 and on the left at L4-5. IMPRESSION: 1. Right parahilar mass. Recommend CT of the chest with contrast for further evaluation. 2. Emphysema. 3. Fluid levels within nondilated loops of large and small bowel suggesting an adynamic ileus without obstruction. 4. Progressive degenerative change and scoliosis in the lumbar spine. These results were called by telephone at the time of interpretation on 08/19/2016 at 11:59 am to Dr. Tanna Furry , who verbally acknowledged these results. Electronically Signed   By: San Morelle M.D.   On: 08/19/2016 11:59   Mr Jodene Nam Head/brain EG Cm  Result Date: 08/19/2016 CLINICAL DATA:  Follow-up acute LEFT MCA territory infarct. History of LEFT internal carotid artery pipeline  stent for aneurysms. EXAM: MRA NECK WITHOUT AND WITH CONTRAST MRA HEAD WITHOUT CONTRAST MRI HEAD WITH CONTRAST TECHNIQUE: Multiplanar and multiecho pulse sequences of the neck were obtained without and with intravenous contrast. Angiographic images of the neck were obtained using MRA technique without and with intravenous contrast.; Angiographic images of the Circle of Willis were obtained using MRA technique without intravenous contrast. Multiplanar multi P pulse sequences of the head obtained with contrast. CONTRAST:  62m MULTIHANCE GADOBENATE DIMEGLUMINE 529 MG/ML IV SOLN COMPARISON:  CT angiogram of the head May 13, 2016 and MRI head August 19, 2016 at 1646 hours FINDINGS: MRI HEAD FINDINGS No abnormal parenchymal enhancement with particular attention LEFT inferior frontal lobe at site of susceptibility artifact. No abnormal extra-axial enhancement. MRA NECK FINDINGS ANTERIOR CIRCULATION: 2 vessel arch. The common carotid arteries are widely patent bilaterally. The carotid bifurcation is patent bilaterally and there is no hemodynamically significant carotid stenosis by NASCET criteria. Tortuous RIGHT internal carotid artery. No evidence for atherosclerosis or flow limiting stenosis of the cervical internal carotid arteries. POSTERIOR CIRCULATION: Bilateral vertebral arteries are patent to the vertebrobasilar junction. Tortuous vessels. No evidence for atherosclerosis or flow limiting stenosis. Source images and MIP image were reviewed. MRA HEAD FINDINGS ANTERIOR CIRCULATION: Normal flow related enhancement of the included cervical, petrous, RIGHT cavernous and RIGHT supraclinoid internal carotid arteries. Loss of signal LEFT cavernous and supraclinoid internal carotid artery at level of pipeline stent. 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 Patent anterior communicating artery. Normal flow related enhancement of the anterior and middle cerebral  arteries, including distal segments. POSTERIOR CIRCULATION: Codominant vertebral  artery's. Basilar artery is patent, with normal flow related enhancement of the main branch vessels. Fenestrated proximal basilar artery. Bilateral posterior communicating artery present. Normal flow related enhancement of the posterior cerebral arteries. No large vessel occlusion, high-grade stenosis, abnormal luminal irregularity, aneurysm. IMPRESSION: MRI HEAD: No abnormal enhancement. MRA HEAD: LEFT internal carotid artery pipeline stent, patency cannot accurately be determined by MR. No emergent large vessel occlusion. 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 MRA NECK:  No hemodynamically significant stenosis. Electronically Signed   By: Elon Alas M.D.   On: 08/19/2016 21:50   US Abdomen Limited Ruq  Result Date: 08/19/2016 CLINICAL DATA:  70 year old female with right upper quadrant abdominal pain and nausea since yesterday. Initial encounter. EXAM: US ABDOMEN LIMITED - RIGHT UPPER QUADRANT COMPARISON:  CT Abdomen and Pelvis 07/23/2016 FINDINGS: Gallbladder: No gallstones or wall thickening visualized. No sonographic Murphy sign noted by sonographer. Common bile duct: Diameter: 5 mm, normal Liver: Background liver echogenicity is within normal limits. An apparent small echogenic focus with shadowing in the right liver on image 29 is not correlated on the recent CT Abdomen and Pelvis and is probably an artifact or pseudo-lesion. No discrete liver lesion. Other findings: Negative sonographic appearance of the visible right kidney (image 40). IMPRESSION: Negative right upper quadrant ultrasound. Electronically Signed   By: Genevie Ann M.D.   On: 08/19/2016 10:49        Scheduled Meds: . amitriptyline  300 mg Oral QHS  . aspirin EC  325 mg Oral q morning - 10a  . DULoxetine  30 mg Oral Daily  . [START ON 08/22/2016] enoxaparin (LOVENOX) injection  40 mg Subcutaneous Daily  . feeding  supplement (ENSURE ENLIVE)  237 mL Oral BID BM  . levothyroxine  75 mcg Oral QAC breakfast  . metoCLOPramide  5 mg Oral TID AC  . polyethylene glycol  17 g Oral Daily  . senna-docusate  1 tablet Oral BID   Continuous Infusions: . sodium chloride 125 mL/hr at 08/19/16 2249  . sodium chloride       LOS: 1 day    Time spent: [place], [place], MD [facility] Pager [phone]  If 7PM-7AM, please contact night-coverage [email or web address] Password [email or web address] 08/20/2016, 1:42 PM

## 2016-08-20 NOTE — Progress Notes (Addendum)
STROKE TEAM PROGRESS NOTE   HISTORY OF PRESENT ILLNESS (per record) [name] is an 70 y.o. female with history of nonruptured cerebral aneurysms S/P endovascular treatment with placement of a pipeline stent in the left internal carotid artery on 07/23/2016 by Dr. Kathyrn Sheriff, residual cerebral aneurysm on the right, a previous TIA / stroke, frequent falls, orthostatic static hypotension, hypertension, hypothyroidism, anxiety, depression, ongoing tobacco use, hard of hearing, hyperglycemia, and hypothyroidism who presented to the emergency department for evaluation of right upper quadrant pain. While here she developed right upper extremity weakness, facial droop, and dysarthria. Neurology was asked to evaluate.  Last month, following her left carotid stent stent placement the patient developed right facial droop, right upper extremity weakness, and dysarthria. The patient was placed on aspirin and Plavix however she was intolerant to the Plavix and it was discontinued. She underwent outpatient therapies and showed significant improvement. Since that time the patient has had difficulties with abdominal pain and has been seen in emergency department on several occasions. She presented today for further evaluation and had an abdominal ultrasound as well as a chest CT with contrast to evaluate a possible nodule. The patient's blood pressure was noted to be low and she was given IV fluid hydration. She also received some morphine which may have contributed to her hypotension.   The patient's husband noted that the patient's speech was becoming more slurred and she was having increased right facial weakness. The patient also noted that her right upper extremity was weaker. She is not a candidate for TPA because of the stroke last month; however, it was felt that a CT angiogram could be considered to look for a large occluded vessel. Unfortunately the patient has already had IV contrast for her CT of the  chest. An MRI Head Wo Contrast is currently pending. We will await the results of the MRI and consider proceeding with the CT angiogram or catheter angiogram if felt to be indicated. In talking with the neuro radiologist he noted that since the patient's creatinine was 1.18 with a GFR of 46 it may still be safe to proceed with a CT angiogram. This was discussed with Dr. Cheral Marker and the emergency department physician. CT Chest with contrast today C/W primary lung cancer - new diagnosis.  Patient was last known well 08/19/2016 at 09:15. Patient was not administered IV t-PA secondary to out of the therapeutic window for treatment and she had had a recent stroke.   SUBJECTIVE (INTERVAL HISTORY) Multiple family members present. Dr. Leonie Man discussed the patient's case and answered questions. Arrangements are being made for lung biopsy. Further antiplatelet therapy following biopsy.   OBJECTIVE Temp:  [97.5 F (36.4 C)-99.2 F (37.3 C)] 97.5 F (36.4 C) (12/28 0900) Pulse Rate:  [87-98] 93 (12/28 0900) Cardiac Rhythm: Normal sinus rhythm (12/28 0702) Resp:  [13-21] 18 (12/28 0900) BP: (94-154)/(62-93) 154/89 (12/28 0900) SpO2:  [91 %-100 %] 97 % (12/28 0900) Weight:  [48 kg (105 lb 12.8 oz)] 48 kg (105 lb 12.8 oz) (12/28 0900)  CBC:  Recent Labs Lab 08/19/16 0915  WBC 10.2  NEUTROABS 7.9*  HGB 9.5*  HCT 30.0*  MCV 95.5  PLT 423*    Basic Metabolic Panel:  Recent Labs Lab 08/19/16 0915  NA 135  K 3.4*  CL 103  CO2 25  GLUCOSE 149*  BUN 9  CREATININE 1.18*  CALCIUM 8.8*    Lipid Panel:    Component Value Date/Time   CHOL 139 08/20/2016 0718  TRIG 72 08/20/2016 0718   TRIG 75 07/12/2006 0818   HDL 51 08/20/2016 0718   CHOLHDL 2.7 08/20/2016 0718   VLDL 14 08/20/2016 0718   LDLCALC 74 08/20/2016 0718   HgbA1c:  Lab Results  Component Value Date   HGBA1C 5.5 06/18/2008   Urine Drug Screen:    Component Value Date/Time   LABOPIA NONE DETECTED 05/04/2014 1145    COCAINSCRNUR NONE DETECTED 05/04/2014 1145   LABBENZ NONE DETECTED 05/04/2014 1145   AMPHETMU NONE DETECTED 05/04/2014 1145   THCU NONE DETECTED 05/04/2014 1145   LABBARB NONE DETECTED 05/04/2014 1145      IMAGING  Ct Chest W Contrast 08/19/2016 1. Solid mass within the superior segment of the right lower lobe apparently extending into the posterior inferior right upper lobe as well as the mediastinum and adjacent paravertebral soft tissues consistent with primary lung carcinoma of 6.9 x 7.2 x 6.6 cm.  2. Metastatic involvement of T7 and T6 vertebral body with with some involvement of posterior elements at these levels.  3. Coronary artery calcifications.    MRI HEAD w/ contrast:  08/19/2016 No abnormal enhancement.   MRA HEAD:  08/19/2016 LEFT internal carotid artery pipeline stent, patency cannot accurately be determined by MR. No emergent large vessel occlusion. 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   MRA NECK:   08/19/2016 No hemodynamically significant stenosis.   Mr Brain Wo Contrast 08/19/2016 1. Cluster of small acute infarcts around the left central sulcus. There also small acute and remote infarcts in the left MCA border zones. Recommend angiographic follow-up in this patient with ipsilateral ICA to MCA stent.  2. Focus of nonacute blood products in the inferior left frontal cortex is likely post ischemic given #1. When appropriate, recommend postcontrast evaluation in this patient with right hilar mass.   Dg Abd 2 Views 08/19/2016 No significant change in the appearance of the bowel gas pattern possibly representing adynamic ileus given scattered air containing nondistended small bowel loops with scattered air-fluid levels.   Dg Abd Acute W/chest 08/19/2016 1. Right parahilar mass. Recommend CT of the chest with contrast for further evaluation.  2. Emphysema.  3. Fluid levels within nondilated loops of large and small bowel  suggesting an adynamic ileus without obstruction.  4. Progressive degenerative change and scoliosis in the lumbar spine.   US Abdomen Limited Ruq 08/19/2016 Negative right upper quadrant ultrasound.    PHYSICAL EXAM Mental Status: Alert, oriented, thought content appropriate.  Very dysarthric. Difficult to understand at times. Able to follow 2 step commands with cueing Cranial Nerves: II: Discs not visualized. Visual fields grossly normal, pupils equal, round, reactive to light and accommodation III,IV, VI: ptosis not present, extra-ocular motions intact bilaterally V,VII: smile asymmetric with right-sided weakness., facial light touch sensation normal bilaterally, unable to raise right eyebrow. VIII: hearing normal bilaterally IX,X: gag reflex present XI: bilateral shoulder shrug XII: midline tongue extension Motor: Right :  Upper extremity   4/5                                      Left:     Upper extremity   5/5             Lower extremity   4/5  Lower extremity   5/5 Tone and bulk:normal tone throughout; no atrophy noted.diminished fine finger movements on . Mild right grip weakness. Orbits the left or right upper extremity. Sensory: Light touch intact throughout, bilaterally Deep Tendon Reflexes: Normoactive. No asymmetry noted.  Plantars:  Downgoing bilaterally.  Cerebellar: Normal finger-to-nose on the left with cueing. Unable to perform on the right secondary to weakness. Normal heel-to-shin test bilaterally. Gait: Deferred CV: pulses palpable but weak in both lower extremities.    ASSESSMENT/PLAN Ms. [name] is a 70 y.o. female with history of nonruptured cerebral aneurysms S/P endovascular treatment with placement of a pipeline stent in the left internal carotid artery on 07/23/2016 by Dr. Kathyrn Sheriff, residual cerebral aneurysm on the right, a previous TIA / stroke, frequent falls, orthostatic static hypotension,  hypertension, hypothyroidism, anxiety, depression, ongoing tobacco use, hard of hearing, hyperglycemia, and hypothyroidism  presenting with right sided weakness and dysarthria. She did not receive IV t-PA due to recent stroke.   Strokes:  Dominant likely embolic from an unknown source.  MRI w/ and w/o - LEFT internal carotid artery pipeline stent, patency cannot accurately be determined by  MR. 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   MRA brain - Cluster of small acute infarcts around the left central sulcus. There also small acute and remote infarcts in the left MCA border zones.   MRA neck - No hemodynamically significant stenosis.   2D Echo - pending  LDL - 74  HgbA1c - pending  VTE prophylaxis - Lovenox  Diet Heart Room service appropriate? Yes; Fluid consistency: Thin  aspirin 325 mg daily prior to admission, now on aspirin 325 mg daily. Patient had been prescribed Plavix prior to admission, but her husband. As he thought it was the etiology of her stroke like symptoms. She has not taken Plavix for approximately 1. She was started on Aggrenox last night is unable to take Plavix. The addition of Aggrenox is not beneficial in this patient with stent. Will discontinue Aggrenox. Recommend treatment with aspirin and Brillinta once lung bx completed.B  Patient counseled to be compliant with her antithrombotic medications  Ongoing aggressive stroke risk factor management  Therapy recommendations: Outpatient physical and occupational therapies.  Disposition:  Pending  Hypertension  Stable  Permissive hypertension (OK if < 220/120) but gradually normalize in 5-7 days  Long-term BP goal normotensive  Hyperlipidemia  Home meds:  No lipid lowering medications prior to admission.  LDL 74 , goal < 70  Consider low-dose Pravachol - in view of malignancy workup will hold statin for now.  Continue statin at discharge  Chest Mass  New  findnig  For bx. IR recommends pulmonary  Pulmonary to consult  Ok for bx at any time from stroke standpoint. strokes are very small and not a contraindication to bx. Dr. 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 Kathyrn Sheriff  Possible hypercoagulable state due to malignancy.  Plavix discontinued secondary to patient intolerance   Other Active Problems  Hypothyroidism  "Recommend angiographic follow-up in this patient with ipsilateral ICA to MCA stent" per radiology.  Hypokalemia -3.4  Anemia-9.5 / 30   PLAN   Chest biopsy  Discuss additional anti platelet therapy  Mild hypokalemia - 3.4  Anemia - 9.5 /  [facility] day # [place] for Pager information 08/20/2016 5:12 PM  I have personally examined this patient, reviewed notes, independently viewed imaging studies, participated in medical decision making and plan of care.ROS completed by me personally and pertinent positives fully documented  I have made any additions or clarifications directly to the above note. Agree with note above. I had a long discussion with the patient, husband as well as I spoke to Dr. Kathyrn Sheriff  and pulmonologist Dr. Nelda Marseille  And recommend biopsy of lung lesion in the next few days. Recommend aspirin and Brillinta for inracranial spent patency  as patient was unable to  tolerate plavix.. Greater than 50% time during this 40 minute visit  was spent on counseling and coordination of of. care about stroke  ,intracranial stent. lung mass biopsy and answering questions. D/W Dr Verlon Au  Antony Contras, MD Medical Director [place] Pager: [phone] 08/20/2016 6:04 PM  To contact Stroke Continuity provider, please refer to  [email or web address] After hours, contact General Neurology

## 2016-08-20 NOTE — Progress Notes (Signed)
  Echocardiogram 2D Echocardiogram has been performed.  Jennette Dubin 08/20/2016, 3:23 PM

## 2016-08-20 NOTE — Care Management Note (Signed)
Case Management Note  Patient Details  Name: [name] MRN: [id number] Date of Birth: March 30, 1946  Subjective/Objective:    Pt admitted with CVA. She is from home.                 Action/Plan: Awaiting PT/OT recommendations and physician orders. CM following for d/c needs.   Expected Discharge Date:                  Expected Discharge Plan:  Home/Self Care  In-House Referral:     Discharge planning Services     Post Acute Care Choice:    Choice offered to:     DME Arranged:    DME Agency:     HH Arranged:    HH Agency:     Status of Service:  In process, will continue to follow  If discussed at Long Length of Stay Meetings, dates discussed:    Additional Comments:  Pollie Friar, RN 08/20/2016, 11:56 AM

## 2016-08-20 NOTE — Consult Note (Signed)
Chief Complaint: Patient was seen in consultation today for right lung mass biopsy Chief Complaint  Patient presents with  . Abdominal Pain   at the request of Dr Fatima Blank  Referring Physician(s): Dr Mickel Crow  Supervising Physician: Corrie Mckusick  Patient Status: [facility] - In-pt  History of Present Illness: [name] is a 70 y.o. 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 Solid mass within the superior segment of the right lower lobe apparently extending into the posterior inferior right upper lobe as well as the mediastinum and adjacent paravertebral soft tissues consistent with primary lung carcinoma of 6.9 x 7.2 x 6.6 cm. 2. Metastatic involvement of T7 and T6 vertebral body with with some involvement of posterior elements at these levels. 3. Coronary artery calcifications.  Request now for right lung mass biopsy Imaging has been reviewed with Dr Earleen Newport ----he approves procedure  Scheduled now for 12/29 Will keep npo and Hold Lovenox  Past Medical History:  Diagnosis Date  . Anxiety   . Depression   . GERD (gastroesophageal reflux disease)    WOULD USE OTC MEDICATION IF NEEDED  . Hypothyroidism   . 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 Rosacea   . Tachycardia    PT STATES HER HEART RATE USUALLY ABOVE 100  . TIA (transient ischemic attack)     Past Surgical History:  Procedure Laterality Date  . BREAST BIOPSY     bilateral benign  . BUNIONECTOMY  2003   bilateral  . IR GENERIC HISTORICAL  06/05/2016   IR ANGIO VERTEBRAL SEL VERTEBRAL BILAT MOD SED 06/05/2016 Consuella Lose, MD [facility]  . IR GENERIC HISTORICAL  06/05/2016   IR ANGIO INTRA  EXTRACRAN SEL INTERNAL CAROTID BILAT MOD SED 06/05/2016 Consuella Lose, MD [facility]  . IR GENERIC HISTORICAL  07/03/2016   IR ANGIOGRAM FOLLOW UP STUDY 07/03/2016 Consuella Lose, MD [facility]  . IR GENERIC HISTORICAL  07/03/2016   IR TRANSCATH/EMBOLIZ 07/03/2016 Consuella Lose, MD [facility]  . LUMBAR LAMINECTOMY/DECOMPRESSION MICRODISCECTOMY Left 03/17/2013   Procedure: LUMBAR LAMINECTOMY/DECOMPRESSION IF RECESS STENOSIS FORAMINOTOMIE OF L4 ROOT AND L5,MICRODISECTOMY  L4-5 ON LEFT;  Surgeon: Tobi Bastos, MD;  Location: [facility];  Service: Orthopedics;  Laterality: Left;  . RADIOLOGY WITH ANESTHESIA N/A 07/03/2016   Procedure: arteriogram, pipeline embolization of aneurysm;  Surgeon: Consuella Lose, MD;  Location: [facility];  Service: Radiology;  Laterality: N/A;  . TONSILLECTOMY  1953    Allergies: Plavix [clopidogrel] and Prednisone  Medications: Prior to Admission medications   Medication Sig Start Date End Date Taking? Authorizing Provider  amitriptyline (ELAVIL) 100 MG tablet TAKE 3 TABLETS BY MOUTH EVERY DAY Patient taking differently: Take 300 mg by mouth at bedtime 04/08/16  Yes Burnis Medin, MD  Ascorbic Acid (VITAMIN C WITH ROSE HIPS) 1000 MG tablet Take 1,000 mg by mouth daily.   Yes Historical Provider, MD  aspirin EC 325 MG tablet Take 325 mg by mouth every morning.  06/15/16  Yes Historical Provider, MD  BIOTIN PO Take 1 tablet by mouth daily.   Yes Historical Provider, MD  diphenhydramine-acetaminophen (TYLENOL PM) 25-500 MG TABS tablet Take 1 tablet by mouth  at bedtime.   Yes Historical Provider, MD  DULoxetine (CYMBALTA) 30 MG capsule TAKE 1 CAPSULE (30 MG TOTAL) BY MOUTH DAILY. 08/03/16  Yes Burnis Medin, MD  gabapentin (NEURONTIN) 300 MG capsule TAKE ONE CAPSULE IN THE DAYTIME AND 2 CAPSULES AT BEDTIME 07/28/16  Yes Burnis Medin, MD  ibuprofen (ADVIL,MOTRIN) 200 MG tablet Take 400-600 mg by mouth every 6 (six) hours as needed (for pain).   Yes  Historical Provider, MD  levothyroxine (SYNTHROID) 75 MCG tablet TAKE 1 TABLET BY MOUTH DAILY BEFORE BREAKFAST. Patient taking differently: Take 75 mcg by mouth daily before breakfast.  02/17/16  Yes Burnis Medin, MD  polyethylene glycol (MIRALAX / GLYCOLAX) packet Take 17 g by mouth daily.    Yes Historical Provider, MD  prasugrel (EFFIENT) 5 MG TABS tablet Take 5 mg by mouth daily. 08/05/16   Historical Provider, MD     Family History  Problem Relation Age of Onset  . Pancreatic cancer Brother   . Heart disease      Social History   Social History  . Marital status: Married    Spouse name: N/A  . Number of children: N/A  . Years of education: N/A   Social History Main Topics  . Smoking status: Current Every Day Smoker    Packs/day: 0.50    Types: Cigarettes  . Smokeless tobacco: Never Used     Comment: back to tobacco 10 per day   . Alcohol use 0.0 oz/week  . Drug use: No  . Sexual activity: Not Asked   Other Topics Concern  . None   Social History Narrative   Occupation:  Advice worker   Works about 30 hours a week to 15 hours    [place] getting a divorce    Married.    No health insurance self employed  Now on medicare    2 dogs     Review of Systems: A 12 point ROS discussed and pertinent positives are indicated in the HPI above.  All other systems are negative.  Review of Systems  Constitutional: Positive for activity change and appetite change. Negative for fatigue and fever.  Respiratory: Positive for shortness of breath.   Gastrointestinal: Positive for abdominal pain and nausea.  Musculoskeletal: Positive for gait problem.  Neurological: Positive for facial asymmetry, weakness and numbness. Negative for dizziness, tremors, seizures, syncope, speech difficulty, light-headedness and headaches.  Psychiatric/Behavioral: Negative for behavioral problems and confusion.    Vital Signs: BP (!) 154/89 (BP Location: Left Arm)    Pulse 93   Temp 97.5 F (36.4 C) (Axillary)   Resp 18   Wt 105 lb 12.8 oz (48 kg)   SpO2 97%   BMI 19.35 kg/m   Physical Exam  Constitutional: She is oriented to person, place, and time.  HENT:  Rt facial droop  Cardiovascular: Normal rate, regular rhythm and normal heart sounds.   Pulmonary/Chest: Effort normal and breath sounds normal.  Abdominal: Soft. There is no tenderness.  Musculoskeletal:  Moves left without issue Rt side is weak Cannot hold pen to write name   Neurological: She is alert and oriented to person, place, and time.  Skin: Skin is warm and dry.  Psychiatric: She has a normal mood and affect. Her behavior is normal.  Consented with dtr at bedside;  also husband via phone  Nursing note and vitals reviewed.   Mallampati Score:  MD Evaluation Airway: WNL Heart: WNL Abdomen:  WNL Chest/ Lungs: WNL ASA  Classification: 3 Mallampati/Airway Score: Two  Imaging: Ct Chest W Contrast  Result Date: 08/19/2016 CLINICAL DATA:  Right hilar mass on chest x-ray, nausea, shortness of breath EXAM: CT CHEST WITH CONTRAST TECHNIQUE: Multidetector CT imaging of the chest was performed during intravenous contrast administration. CONTRAST:  90m ISOVUE-300 IOPAMIDOL (ISOVUE-300) INJECTION 61% COMPARISON:  Chest x-ray of 08/19/2016 FINDINGS: Cardiovascular: Coronary artery calcifications are noted diffusely, primarily involving the left anterior descending coronary artery. The heart is within normal limits in size. No pericardial effusion is seen. The mid ascending thoracic aorta measures 34 mm in diameter. The pulmonary arteries opacify with no central abnormality noted. Mediastinum/Nodes: The mass within the superior segment right lower lobe extending in the posterior inferior right upper lobe does appear to extend into the mediastinum with loss of adjacent fat planes, suggesting direct mediastinal invasion. No definite adenopathy is seen although subcarinal nodes may well be  present. Lungs/Pleura: The right perihilar mass described on chest x-ray represents a large solid mass involving the superior segment of the right lower lobe in apparently invading into the posterior inferior aspect of the right upper lobe. In its largest axial plane this mass measures 6.9 x 7.2 cm with a height of approximately 6.6 cm. There may also be direct invasion of the local paravertebral soft tissues. This lesion is consistent with primary lung carcinoma. There is an 8 mm poorly defined opacity within the right upper lobe posteriorly worrisome for metastatic lesion. Some volume loss is present posteriorly in the right lower lobe. No lesion is seen throughout the left lung. Upper Abdomen: The portion of the upper abdomen that is visualized is unremarkable. Musculoskeletal: There is blastic involvement of T7 vertebral body and to a lesser degree T6 with some involvement of posterior elements consistent with bone metastases. IMPRESSION: 1. Solid mass within the superior segment of the right lower lobe apparently extending into the posterior inferior right upper lobe as well as the mediastinum and adjacent paravertebral soft tissues consistent with primary lung carcinoma of 6.9 x 7.2 x 6.6 cm. 2. Metastatic involvement of T7 and T6 vertebral body with with some involvement of posterior elements at these levels. 3. Coronary artery calcifications. Electronically Signed   By: PIvar DrapeM.D.   On: 08/19/2016 13:45   Mr MJodene NamNeck W Wo Contrast  Result Date: 08/19/2016 CLINICAL DATA:  Follow-up acute LEFT MCA territory infarct. 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 EXAM: MRA NECK WITHOUT AND WITH CONTRAST MRA HEAD WITHOUT CONTRAST MRI HEAD WITH CONTRAST TECHNIQUE: Multiplanar and multiecho pulse sequences of the neck were obtained without and with intravenous contrast. Angiographic images of the neck were obtained using MRA technique without and with intravenous contrast.;  Angiographic images of the Circle of Willis were obtained using MRA technique without intravenous contrast. Multiplanar multi P pulse sequences of the head obtained with contrast. CONTRAST:  179mMULTIHANCE GADOBENATE DIMEGLUMINE 529 MG/ML IV SOLN COMPARISON:  CT angiogram of the head May 13, 2016 and MRI head August 19, 2016 at 1646 hours FINDINGS: MRI HEAD FINDINGS No abnormal parenchymal enhancement with particular attention LEFT inferior frontal lobe at site of susceptibility artifact. No abnormal extra-axial enhancement. MRA NECK FINDINGS ANTERIOR CIRCULATION: 2 vessel arch. The common carotid arteries are widely patent bilaterally. The carotid bifurcation is patent bilaterally and there is no hemodynamically significant carotid stenosis by NASCET criteria. Tortuous RIGHT internal carotid artery. No evidence for atherosclerosis or flow limiting stenosis of the cervical internal  carotid arteries. POSTERIOR CIRCULATION: Bilateral vertebral arteries are patent to the vertebrobasilar junction. Tortuous vessels. No evidence for atherosclerosis or flow limiting stenosis. Source images and MIP image were reviewed. MRA HEAD FINDINGS ANTERIOR CIRCULATION: Normal flow related enhancement of the included cervical, petrous, RIGHT cavernous and RIGHT supraclinoid internal carotid arteries. Loss of signal LEFT cavernous and supraclinoid internal carotid artery at level of pipeline stent. 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 Patent anterior communicating artery. Normal flow related enhancement of the anterior and middle cerebral arteries, including distal segments. POSTERIOR CIRCULATION: Codominant vertebral artery's. Basilar artery is patent, with normal flow related enhancement of the main branch vessels. Fenestrated proximal basilar artery. Bilateral posterior communicating artery present. Normal flow related enhancement of the posterior cerebral  arteries. No large vessel occlusion, high-grade stenosis, abnormal luminal irregularity, aneurysm. IMPRESSION: MRI HEAD: No abnormal enhancement. MRA HEAD: LEFT internal carotid artery pipeline stent, patency cannot accurately be determined by MR. No emergent large vessel occlusion. 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 MRA NECK:  No hemodynamically significant stenosis. Electronically Signed   By: Elon Alas M.D.   On: 08/19/2016 21:50   Mr Brain Wo Contrast  Result Date: 08/19/2016 CLINICAL DATA:  Right hilar mass.  Right upper extremity weakness. EXAM: MRI HEAD WITHOUT CONTRAST TECHNIQUE: Multiplanar, multiecho pulse sequences of the brain and surrounding structures were obtained without intravenous contrast. COMPARISON:  CTA head neck 04/2016.  Brain MRI 05/04/2014 FINDINGS: Brain: There is a cluster of acute infarcts in the cortical and subcortical left frontal parietal junction along the central sulcus. Punctate cortical infarct in the superior left temporal gyrus and in the left occipital parietal region. There are interval but chronic infarcts also seen in the anterior left frontal lobe, which likely accounts for nonacute blood products in the inferior left frontal lobe. Patient will need postcontrast imaging to evaluate the focus of hemorrhage and white matter changes in this patient with right hilar mass. Microvascular ischemic change in the bilateral cerebral white matter. Rounded pontine T2 hyperintensity on the right, with hemosiderin staining. The this could reflect a capillary telangiectasia or remote infarct. No evidence of subarachnoid hemorrhage. Vascular: Normal flow voids.  Known intracranial aneurysms. Skull and upper cervical spine: Negative Sinuses/Orbits: Negative IMPRESSION: 1. Cluster of small acute infarcts around the left central sulcus. There also small acute and remote infarcts in the left MCA border zones. Recommend angiographic follow-up  in this patient with ipsilateral ICA to MCA stent. 2. Focus of nonacute blood products in the inferior left frontal cortex is likely post ischemic given #1. When appropriate, recommend postcontrast evaluation in this patient with right hilar mass. Electronically Signed   By: Monte Fantasia M.D.   On: 08/19/2016 17:17   Mr Brain W Contrast  Result Date: 08/19/2016 CLINICAL DATA:  Follow-up acute LEFT MCA territory infarct. 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 EXAM: MRA NECK WITHOUT AND WITH CONTRAST MRA HEAD WITHOUT CONTRAST MRI HEAD WITH CONTRAST TECHNIQUE: Multiplanar and multiecho pulse sequences of the neck were obtained without and with intravenous contrast. Angiographic images of the neck were obtained using MRA technique without and with intravenous contrast.; Angiographic images of the Circle of Willis were obtained using MRA technique without intravenous contrast. Multiplanar multi P pulse sequences of the head obtained with contrast. CONTRAST:  32m MULTIHANCE GADOBENATE DIMEGLUMINE 529 MG/ML IV SOLN COMPARISON:  CT angiogram of the head May 13, 2016 and MRI head August 19, 2016 at 1646 hours FINDINGS: MRI HEAD FINDINGS No abnormal parenchymal enhancement with particular attention LEFT inferior frontal lobe at site of susceptibility artifact. No abnormal extra-axial enhancement. MRA NECK FINDINGS ANTERIOR CIRCULATION: 2 vessel arch. The common carotid arteries are widely patent bilaterally. The carotid bifurcation is patent bilaterally and there is no hemodynamically significant carotid stenosis by NASCET criteria. Tortuous RIGHT internal carotid artery. No evidence for atherosclerosis or flow limiting stenosis of the cervical internal carotid arteries. POSTERIOR CIRCULATION: Bilateral vertebral arteries are patent to the vertebrobasilar junction. Tortuous vessels. No evidence for atherosclerosis or flow limiting stenosis. Source images and MIP image were  reviewed. MRA HEAD FINDINGS ANTERIOR CIRCULATION: Normal flow related enhancement of the included cervical, petrous, RIGHT cavernous and RIGHT supraclinoid internal carotid arteries. Loss of signal LEFT cavernous and supraclinoid internal carotid artery at level of pipeline stent. 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 Patent anterior communicating artery. Normal flow related enhancement of the anterior and middle cerebral arteries, including distal segments. POSTERIOR CIRCULATION: Codominant vertebral artery's. Basilar artery is patent, with normal flow related enhancement of the main branch vessels. Fenestrated proximal basilar artery. Bilateral posterior communicating artery present. Normal flow related enhancement of the posterior cerebral arteries. No large vessel occlusion, high-grade stenosis, abnormal luminal irregularity, aneurysm. IMPRESSION: MRI HEAD: No abnormal enhancement. MRA HEAD: LEFT internal carotid artery pipeline stent, patency cannot accurately be determined by MR. No emergent large vessel occlusion. 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 MRA NECK:  No hemodynamically significant stenosis. Electronically Signed   By: Elon Alas M.D.   On: 08/19/2016 21:50   Ct Abdomen Pelvis W Contrast  Result Date: 07/23/2016 CLINICAL DATA:  Right lower quadrant pain for several months, no nausea or vomiting EXAM: CT ABDOMEN AND PELVIS WITH CONTRAST TECHNIQUE: Multidetector CT imaging of the abdomen and pelvis was performed using the standard protocol following bolus administration of intravenous contrast. CONTRAST:  1 ISOVUE-300 IOPAMIDOL (ISOVUE-300) INJECTION 61% COMPARISON:  None. FINDINGS: Lower chest: Lung bases shows small right pleural effusion. There is streaky atelectasis or infiltrate in right base posteriorly. Hepatobiliary: Enhanced liver shows mild intrahepatic biliary ductal dilatation. No  calcified gallstones are noted within gallbladder. CBD measures 9 mm in diameter. Pancreas: Enhanced pancreas is atrophic. Spleen: Enhanced spleen is normal. Adrenals/Urinary Tract: No adrenal gland mass. Enhanced kidneys are symmetrical in size. There is segmental area of cortical decreased attenuation in mid and lower pole of the right kidney. There is mild cortical/ capsular depression at this level. Findings probable due to scarring rather than segmental pyelonephritis. Clinical correlation is necessary. Please see coronal image 58. Delayed renal images shows bilateral renal symmetrical excretion. Bilateral visualized proximal ureter is unremarkable. There is a distended urinary bladder without bladder filling defects. Stomach/Bowel: No gastric outlet obstruction. No small bowel obstruction. Oral contrast material was given to the patient. Mild distended distal small bowel loops with some contrast air-fluid level. Findings most likely due to ileus. No thickening of small bowel wall. Abundant stool noted in right colon transverse colon descending colon and proximal sigmoid colon. No pericecal inflammation. Normal appendix is partially visualized in coronal image 44. Vascular/Lymphatic: Atherosclerotic calcifications of abdominal aorta and iliac arteries. No aortic aneurysm. No adenopathy. Reproductive: No pelvic masses noted. The uterus is mild anteflexed. Uterus is normal size. No adnexal mass. No inguinal adenopathy is noted. Other: No ascites or free abdominal air. Musculoskeletal: No destructive bony lesions are noted. Significant disc space flattening with endplate  sclerotic changes endplate irregularity mild anterior and mild posterior spurring at L4-L5 level. Moderate disc space flattening with vacuum disc phenomenon mild anterior spurring at L3-L4 level. IMPRESSION: 1. There is small right pleural effusion with right base posterior atelectasis or infiltrate. 2. Mild intrahepatic and extrahepatic biliary  ductal dilatation. No calcified gallstones are noted within gallbladder. 3. There is segmental area of decreased attenuation in the cortex of the right kidney midpole and lower pole. This area measures about 4 cm. As there is some cortical/ subcapsular retraction I suspect this represents scarring rather than acute pyelonephritis. Clinical correlation is necessary. Bilateral renal symmetrical excretion. 4. Abundant colonic stool noted right colon transverse colon and descending colon. 5. Mild distal distension of the small bowel with some contrast air levels most likely mild ileus. No small bowel obstruction. 6. Atherosclerotic calcifications of abdominal aorta and iliac arteries. 7. Degenerative changes lumbar spine L3-L4 and L4-L5 level. 8. There is a distended urinary bladder without bladder filling defects. 9. No hydronephrosis or hydroureter. Electronically Signed   By: Lahoma Crocker M.D.   On: 07/23/2016 16:11   Dg Abd 2 Views  Result Date: 08/19/2016 CLINICAL DATA:  Small-bowel obstruction EXAM: ABDOMEN - 2 VIEW COMPARISON:  1134 hours on the same day FINDINGS: Scattered nondistended air containing small bowel loops with a few scattered air-fluid levels are again noted suggesting adynamic ileus contrast opacifies the urinary bladder. There is lower lumbar degenerative disc disease from L3 through S1. No free air is identified. Right hilar mass is again seen. IMPRESSION: No significant change in the appearance of the bowel gas pattern possibly representing adynamic ileus given scattered air containing nondistended small bowel loops with scattered air-fluid levels. Right hilar masslike abnormality is again seen. Electronically Signed   By: Ashley Royalty M.D.   On: 08/19/2016 21:40   Dg Abd Acute W/chest  Result Date: 08/19/2016 CLINICAL DATA:  Abdominal pain intermittently over the last 1 year. EXAM: DG ABDOMEN ACUTE W/ 1V CHEST COMPARISON:  Two-view chest x-ray 03/16/2013. FINDINGS: The heart size is  normal. A right perihilar mass is suspected. Aortic atherosclerosis is present. Emphysematous changes are again noted. No focal airspace disease is present. Fluid levels are present within nondilated loops of large and small bowel. The no obstruction or free air. Progressive scoliosis is present at L3-4 and L4-5 with asymmetric endplate change on the right at L3-4 and on the left at L4-5. IMPRESSION: 1. Right parahilar mass. Recommend CT of the chest with contrast for further evaluation. 2. Emphysema. 3. Fluid levels within nondilated loops of large and small bowel suggesting an adynamic ileus without obstruction. 4. Progressive degenerative change and scoliosis in the lumbar spine. These results were called by telephone at the time of interpretation on 08/19/2016 at 11:59 am to Dr. Tanna Furry , who verbally acknowledged these results. Electronically Signed   By: San Morelle M.D.   On: 08/19/2016 11:59   Mr Jodene Nam Head/brain BD Cm  Result Date: 08/19/2016 CLINICAL DATA:  Follow-up acute LEFT MCA territory infarct. 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 EXAM: MRA NECK WITHOUT AND WITH CONTRAST MRA HEAD WITHOUT CONTRAST MRI HEAD WITH CONTRAST TECHNIQUE: Multiplanar and multiecho pulse sequences of the neck were obtained without and with intravenous contrast. Angiographic images of the neck were obtained using MRA technique without and with intravenous contrast.; Angiographic images of the Circle of Willis were obtained using MRA technique without intravenous contrast. Multiplanar multi P pulse sequences of the head obtained with  contrast. CONTRAST:  31m MULTIHANCE GADOBENATE DIMEGLUMINE 529 MG/ML IV SOLN COMPARISON:  CT angiogram of the head May 13, 2016 and MRI head August 19, 2016 at 1646 hours FINDINGS: MRI HEAD FINDINGS No abnormal parenchymal enhancement with particular attention LEFT inferior frontal lobe at site of susceptibility artifact. No abnormal  extra-axial enhancement. MRA NECK FINDINGS ANTERIOR CIRCULATION: 2 vessel arch. The common carotid arteries are widely patent bilaterally. The carotid bifurcation is patent bilaterally and there is no hemodynamically significant carotid stenosis by NASCET criteria. Tortuous RIGHT internal carotid artery. No evidence for atherosclerosis or flow limiting stenosis of the cervical internal carotid arteries. POSTERIOR CIRCULATION: Bilateral vertebral arteries are patent to the vertebrobasilar junction. Tortuous vessels. No evidence for atherosclerosis or flow limiting stenosis. Source images and MIP image were reviewed. MRA HEAD FINDINGS ANTERIOR CIRCULATION: Normal flow related enhancement of the included cervical, petrous, RIGHT cavernous and RIGHT supraclinoid internal carotid arteries. Loss of signal LEFT cavernous and supraclinoid internal carotid artery at level of pipeline stent. 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 Patent anterior communicating artery. Normal flow related enhancement of the anterior and middle cerebral arteries, including distal segments. POSTERIOR CIRCULATION: Codominant vertebral artery's. Basilar artery is patent, with normal flow related enhancement of the main branch vessels. Fenestrated proximal basilar artery. Bilateral posterior communicating artery present. Normal flow related enhancement of the posterior cerebral arteries. No large vessel occlusion, high-grade stenosis, abnormal luminal irregularity, aneurysm. IMPRESSION: MRI HEAD: No abnormal enhancement. MRA HEAD: LEFT internal carotid artery pipeline stent, patency cannot accurately be determined by MR. No emergent large vessel occlusion. 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 MRA NECK:  No hemodynamically significant stenosis. Electronically Signed   By: CElon AlasM.D.   On: 08/19/2016 21:50   [place] Limited Ruq  Result  Date: 08/19/2016 CLINICAL DATA:  70year old female with right upper quadrant abdominal pain and nausea since yesterday. Initial encounter. EXAM: [place] - RIGHT UPPER QUADRANT COMPARISON:  CT Abdomen and Pelvis 07/23/2016 FINDINGS: Gallbladder: No gallstones or wall thickening visualized. No sonographic Murphy sign noted by sonographer. Common bile duct: Diameter: 5 mm, normal Liver: Background liver echogenicity is within normal limits. An apparent small echogenic focus with shadowing in the right liver on image 29 is not correlated on the recent CT Abdomen and Pelvis and is probably an artifact or pseudo-lesion. No discrete liver lesion. Other findings: Negative sonographic appearance of the visible right kidney (image 40). IMPRESSION: Negative right upper quadrant ultrasound. Electronically Signed   By: HGenevie AnnM.D.   On: 08/19/2016 10:49    Labs:  CBC:  Recent Labs  06/05/16 0743 06/30/16 0955 07/23/16 1158 08/19/16 0915  WBC 6.5 8.9 17.3* 10.2  HGB 11.4* 11.1* 9.7* 9.5*  HCT 33.3* 33.3* 29.9* 30.0*  PLT 380 434* 502* 423*    COAGS:  Recent Labs  06/05/16 0743 06/30/16 0955 08/20/16 0718  INR 0.93 1.00 1.06  APTT 28 29 40*    BMP:  Recent Labs  06/05/16 0743 06/30/16 0955 07/23/16 1158 08/19/16 0915  NA 135 136 137 135  K 4.0 3.5 3.4* 3.4*  CL 105 103 104 103  CO2 '24 24 22 25  '$ GLUCOSE 99 73 137* 149*  BUN '7 6 10 9  '$ CALCIUM 8.9 9.1 9.2 8.8*  CREATININE 0.89 0.84 1.34* 1.18*  GFRNONAA >60 >60 40* 46*  GFRAA >60 >60 46* 53*    LIVER FUNCTION TESTS:  Recent Labs  05/19/16  1159 07/23/16 1158 08/19/16 0915  BILITOT 0.2 0.1* 0.1*  AST '13 18 17  '$ ALT 6 11* 7*  ALKPHOS 100 76 82  PROT 6.0 6.0* 6.2*  ALBUMIN 3.1* 2.9* 2.7*    TUMOR MARKERS: No results for input(s): AFPTM, CEA, CA199, CHROMGRNA in the last 8760 hours.  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 All of the patient's questions were answered, patient is agreeable to proceed. Consent signed and in chart.   Thank you for this interesting consult.  I greatly enjoyed meeting [name] and look forward to participating in their care.  A copy of this report was sent to the requesting provider on this date.  Electronically Signed: Monia Sabal A 08/20/2016, 1:01 PM   I spent a total of 40 Minutes    in face to face in clinical consultation, greater than 50% of which was counseling/coordinating care for right lung mass bx

## 2016-08-20 NOTE — Progress Notes (Signed)
Patient ID: [name], female   DOB: 1945-12-05, 70 y.o.   MRN: [id number]   Request received for biopsy of lung/mediastinal mass  Dr Earleen Newport has reviewed imaging Rec: Pulmonary Bronchoscopy Should be very accessible for bronch  Dr Verlon Au aware

## 2016-08-20 NOTE — Evaluation (Signed)
Occupational Therapy Evaluation Patient Details Name: [name] MRN: [id number] DOB: [birth year] Today's Date: 08/20/2016    History of Present Illness Pt is a 70 y.o. female admitted from ED with abdominal pain and started having strokelike symptoms. MRI showed multiple infarcts left hemisphere. Pt now diagnosed with CVA (cerebrum). 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 PMH: tachycardia, back pain, emphysema, depression, anxiety.    Clinical Impression   Pt was participating in OPPT and OT prior to admission. She was able to ambulate without a device and care for herself. Her husband assisted with IADL. Pt presents with impaired cognition/awareness of deficits, R UE weakness and incoordination and decreased balance. She reports back pain. Will follow acutely. Recommending resumption of OPOT upon d/c.    Follow Up Recommendations  Outpatient OT    Equipment Recommendations  None recommended by OT    Recommendations for Other Services       Precautions / Restrictions Precautions Precautions: Fall Restrictions Weight Bearing Restrictions: No      Mobility Bed Mobility Overal bed mobility: Needs Assistance Bed Mobility: Sit to Supine     Supine to sit: Supervision Sit to supine: Supervision   General bed mobility comments: HOB up 30 degrees, no assist, able to pull herself up in bed  Transfers Overall transfer level: Needs assistance Equipment used: None Transfers: Sit to/from Stand Sit to Stand: Min guard         General transfer comment: min guard for stability with standing. Mild instability but no loss of balance.     Balance Overall balance assessment: Needs assistance Sitting-balance support: No upper extremity supported Sitting balance-Leahy Scale: Good     Standing balance support: No upper extremity supported Standing balance-Leahy Scale: Fair Standing balance comment: decreased stability with head rotations to  right                            ADL Overall ADL's : Needs assistance/impaired Eating/Feeding: Set up;Sitting   Grooming: Wash/dry hands;Standing;Min guard   Upper Body Bathing: Supervision/ safety;Set up;Sitting   Lower Body Bathing: Min guard;Sit to/from stand   Upper Body Dressing : Set up;Sitting   Lower Body Dressing: Min guard;Sit to/from stand   Toilet Transfer: Min guard;Ambulation;Regular Museum/gallery exhibitions officer and Hygiene: Min guard;Sit to/from stand       Functional mobility during ADLs: Min guard General ADL Comments: Educated pt's family in home safety with regard to pt's cognitive deficits.     Vision     Perception     Praxis      Pertinent Vitals/Pain Pain Assessment: 0-10 Pain Score: 10-Worst pain ever Pain Location: back Pain Descriptors / Indicators:  ("pain") Pain Intervention(s): Monitored during session     Hand Dominance Right   Extremity/Trunk Assessment Upper Extremity Assessment Upper Extremity Assessment: RUE deficits/detail RUE Deficits / Details: +drift, generalized weakness, decreased coordination RUE Sensation: decreased proprioception RUE Coordination: decreased fine motor;decreased gross motor   Lower Extremity Assessment Lower Extremity Assessment: Defer to PT evaluation       Communication Communication Communication: No difficulties   Cognition Arousal/Alertness: Awake/alert Behavior During Therapy: WFL for tasks assessed/performed Overall Cognitive Status: Impaired/Different from baseline Area of Impairment: Safety/judgement;Memory     Memory: Decreased short-term memory   Safety/Judgement: Decreased awareness of safety;Decreased awareness of deficits     General Comments: Pt looking to family repeatedly during session for answering questions.  Family correcting pt regarding home situation.    General Comments       Exercises       Shoulder Instructions      Home Living  Family/patient expects to be discharged to:: Private residence Living Arrangements: Spouse/significant other Available Help at Discharge: Family;Available PRN/intermittently Type of Home: House Home Access: Stairs to enter [employer] of Steps: 5 Entrance Stairs-Rails: Right;Left Home Layout: One level     Bathroom Shower/Tub: Teacher, early years/pre: Standard     Home Equipment: None   Additional Comments: had a small "chair" to use in the shower previously but no backrest.       Prior Functioning/Environment Level of Independence: Independent  Gait / Transfers Assistance Needed: walked without a device ADL's / Homemaking Assistance Needed: husband assisted with meal prep, pt was performing self care, pt primarily likes to watch Netflix movies on her tablet   Comments: Pt/family report that the pt was ambulatory without an assistive device.         OT Problem List: Impaired balance (sitting and/or standing);Decreased cognition;Decreased coordination;Decreased strength   OT Treatment/Interventions: Self-care/ADL training;Cognitive remediation/compensation;Therapeutic activities;Balance training;Patient/family education    OT Goals(Current goals can be found in the care plan section) Acute Rehab OT Goals Patient Stated Goal: get back home, return to OP therapy OT Goal Formulation: With patient Time For Goal Achievement: 09/03/16 Potential to Achieve Goals: Good ADL Goals Pt Will Perform Grooming: with supervision;standing Pt Will Perform Upper Body Dressing: with supervision;sitting Pt Will Perform Lower Body Dressing: with supervision;sit to/from stand Pt Will Transfer to Toilet: with supervision;ambulating;regular height toilet Pt Will Perform Toileting - Clothing Manipulation and hygiene: with supervision;sit to/from stand Pt Will Perform Tub/Shower Transfer: Tub transfer;with supervision;ambulating;shower seat Additional ADL Goal #1: Family will  be aware of possible safety concerns related to pt's cognitive deficits.  OT Frequency: Min 2X/week   Barriers to D/C:            Co-evaluation              End of Session Equipment Utilized During Treatment: Gait belt  Activity Tolerance: Patient tolerated treatment well Patient left: in bed (going to echo)   Time: 3716-9678 OT Time Calculation (min): 30 min Charges:  OT General Charges $OT Visit: 1 Procedure OT Evaluation $OT Eval Moderate Complexity: 1 Procedure G-Codes:    Malka So 08/20/2016, 4:25 PM  [phone]

## 2016-08-20 NOTE — Evaluation (Signed)
Physical Therapy Evaluation Patient Details Name: [name] MRN: [id number] DOB: [birth year] Today's Date: 08/20/2016   History of Present Illness  Pt is a 70 y.o. female admitted from ED with abdominal pain and started having strokelike symptoms. MRI showed multiple infarcts left hemisphere. Pt now diagnosed with CVA (cerebrum). 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 PMH: tachycardia, back pain, emphysema, depression, anxiety.   Clinical Impression  At this time the pt is demonstrating the ability to ambulate 250 ft with min guard assistance and no assistive device. She did have mild instability with head turns to the right. Although the pt was conversationally appropriate throughout the session, she did rely on her husband to report her living situation.  Based upon the patient's current mobility level, recommending continuation of OPPT (neuro) services which she reportedly had recently started. PT to continue to follow acutely to progress mobility and modify recommendations as needed.     Follow Up Recommendations Outpatient PT;Supervision for mobility/OOB    Equipment Recommendations  None recommended by PT    Recommendations for Other Services       Precautions / Restrictions Precautions Precautions: Fall Restrictions Weight Bearing Restrictions: No      Mobility  Bed Mobility Overal bed mobility: Needs Assistance Bed Mobility: Supine to Sit     Supine to sit: Supervision     General bed mobility comments: HOB approx 30 degrees, using rail to assist  Transfers Overall transfer level: Needs assistance Equipment used: None Transfers: Sit to/from Stand Sit to Stand: Min guard         General transfer comment: min guard for stability with standing. Mild instability but no loss of balance.   Ambulation/Gait Ambulation/Gait assistance: Min guard Ambulation Distance (Feet): 250 Feet Assistive device: None Gait Pattern/deviations:  Step-through pattern Gait velocity: mild decrease   General Gait Details: even strides,decreased stability with head rotations to right (mild loss of balance with independent recovery)  Stairs            Wheelchair Mobility    Modified Rankin (Stroke Patients Only) Modified Rankin (Stroke Patients Only) Pre-Morbid Rankin Score: Moderate disability Modified Rankin: Moderately severe disability     Balance Overall balance assessment: Needs assistance Sitting-balance support: No upper extremity supported Sitting balance-Leahy Scale: Good     Standing balance support: No upper extremity supported Standing balance-Leahy Scale: Fair Standing balance comment: decreased stability with head rotations to right                             Pertinent Vitals/Pain Pain Assessment: 0-10 Pain Score: 10-Worst pain ever Pain Location: back Pain Descriptors / Indicators:  ("pain") Pain Intervention(s): Limited activity within patient's tolerance;Monitored during session (pt reports recently receiving pain medication)    Home Living Family/patient expects to be discharged to:: Private residence Living Arrangements: Spouse/significant other Available Help at Discharge: Family;Available PRN/intermittently Type of Home: House Home Access: Stairs to enter Entrance Stairs-Rails: Psychiatric nurse of Steps: 5 Home Layout: One level Home Equipment: None Additional Comments: had a small "chair" to use in the shower previously but no backrest.     Prior Function Level of Independence: Independent         Comments: Pt/family report that the pt was ambulatory without an assistive device.      Hand Dominance        Extremity/Trunk Assessment   Upper Extremity Assessment Upper Extremity Assessment:  Defer to OT evaluation    Lower Extremity Assessment Lower Extremity Assessment: Generalized weakness       Communication   Communication: No  difficulties  Cognition Arousal/Alertness: Awake/alert Behavior During Therapy: WFL for tasks assessed/performed                   General Comments: Pt looking to family repeatedly during session for answering questions. Family correcting pt regarding home situation.     General Comments      Exercises     Assessment/Plan    PT Assessment Patient needs continued PT services  PT Problem List Decreased strength;Decreased range of motion;Decreased activity tolerance;Decreased balance;Decreased mobility;Decreased safety awareness;Pain          PT Treatment Interventions DME instruction;Gait training;Stair training;Functional mobility training;Therapeutic activities;Therapeutic exercise;Balance training;Neuromuscular re-education;Patient/family education    PT Goals (Current goals can be found in the Care Plan section)  Acute Rehab PT Goals Patient Stated Goal: get back home PT Goal Formulation: With patient/family Time For Goal Achievement: 09/03/16 Potential to Achieve Goals: Good    Frequency Min 4X/week   Barriers to discharge        Co-evaluation               End of Session Equipment Utilized During Treatment: Gait belt Activity Tolerance: Patient tolerated treatment well Patient left: in chair;with call bell/phone within reach;with family/visitor present Nurse Communication: Mobility status         Time: 1349-1416 PT Time Calculation (min) (ACUTE ONLY): 27 min   Charges:   PT Evaluation $PT Eval Moderate Complexity: 1 Procedure PT Treatments $Gait Training: 8-22 mins   PT G Codes:        Cassell Clement, PT, CSCS Pager [phone] Office [phone]  08/20/2016, 4:09 PM

## 2016-08-20 NOTE — Progress Notes (Signed)
MRA neck 12/27 with normal cervical carotid arteries. Please advise if carotid duplex still needed.   Landry Mellow, RDMS, RVT 08/20/2016

## 2016-08-21 ENCOUNTER — Inpatient Hospital Stay (HOSPITAL_COMMUNITY): Payer: Medicare Other

## 2016-08-21 ENCOUNTER — Encounter: Payer: Medicare Other | Admitting: Internal Medicine

## 2016-08-21 LAB — CBC WITH DIFFERENTIAL/PLATELET
BASOS PCT: 0 %
Basophils Absolute: 0 10*3/uL (ref 0.0–0.1)
EOS ABS: 0.5 10*3/uL (ref 0.0–0.7)
EOS PCT: 6 %
HCT: 28.4 % — ABNORMAL LOW (ref 36.0–46.0)
Hemoglobin: 9.1 g/dL — ABNORMAL LOW (ref 12.0–15.0)
Lymphocytes Relative: 14 %
Lymphs Abs: 1.3 10*3/uL (ref 0.7–4.0)
MCH: 29.5 pg (ref 26.0–34.0)
MCHC: 32 g/dL (ref 30.0–36.0)
MCV: 92.2 fL (ref 78.0–100.0)
MONO ABS: 0.6 10*3/uL (ref 0.1–1.0)
MONOS PCT: 7 %
Neutro Abs: 6.8 10*3/uL (ref 1.7–7.7)
Neutrophils Relative %: 73 %
Platelets: 406 10*3/uL — ABNORMAL HIGH (ref 150–400)
RBC: 3.08 MIL/uL — ABNORMAL LOW (ref 3.87–5.11)
RDW: 13.9 % (ref 11.5–15.5)
WBC: 9.2 10*3/uL (ref 4.0–10.5)

## 2016-08-21 LAB — BASIC METABOLIC PANEL
Anion gap: 10 (ref 5–15)
BUN: 5 mg/dL — ABNORMAL LOW (ref 6–20)
CALCIUM: 7.7 mg/dL — AB (ref 8.9–10.3)
CO2: 21 mmol/L — AB (ref 22–32)
CREATININE: 0.71 mg/dL (ref 0.44–1.00)
Chloride: 107 mmol/L (ref 101–111)
GFR calc non Af Amer: >60 mL/min (ref >60)
Glucose, Bld: 90 mg/dL (ref 65–99)
Potassium: 2.9 mmol/L — ABNORMAL LOW (ref 3.5–5.1)
SODIUM: 138 mmol/L (ref 135–145)

## 2016-08-21 LAB — HEMOGLOBIN A1C
Hgb A1c MFr Bld: 5.4 % (ref 4.8–5.6)
MEAN PLASMA GLUCOSE: 108 mg/dL

## 2016-08-21 MED ORDER — FENTANYL CITRATE (PF) 100 MCG/2ML IJ SOLN
INTRAMUSCULAR | Status: AC | PRN
  Administered 2016-08-21 (×2): 25 ug via INTRAVENOUS

## 2016-08-21 MED ORDER — TICAGRELOR 90 MG PO TABS: 90.0000 mg | ORAL_TABLET | Freq: Two times a day (BID) | ORAL | 6 refills | Status: AC

## 2016-08-21 MED ORDER — POTASSIUM CHLORIDE CRYS ER 20 MEQ PO TBCR: 40.0000 meq | EXTENDED_RELEASE_TABLET | Freq: Every day | ORAL | 0 refills | Status: DC

## 2016-08-21 MED ORDER — FENTANYL CITRATE (PF) 100 MCG/2ML IJ SOLN
INTRAMUSCULAR | Status: AC
  Filled 2016-08-21: qty 2

## 2016-08-21 MED ORDER — SUCRALFATE 1 G PO TABS: 1.0000 g | ORAL_TABLET | Freq: Three times a day (TID) | ORAL | 0 refills | Status: DC

## 2016-08-21 MED ORDER — ASPIRIN 81 MG PO CHEW: 81.0000 mg | CHEWABLE_TABLET | Freq: Every day | ORAL | Status: AC

## 2016-08-21 MED ORDER — PANTOPRAZOLE SODIUM 40 MG PO TBEC: 40.0000 mg | DELAYED_RELEASE_TABLET | Freq: Two times a day (BID) | ORAL | 0 refills | Status: DC

## 2016-08-21 MED ORDER — LIDOCAINE HCL 1 % IJ SOLN
INTRAMUSCULAR | Status: AC
  Filled 2016-08-21: qty 20

## 2016-08-21 MED ORDER — TRAMADOL HCL 50 MG PO TABS: 50.0000 mg | ORAL_TABLET | Freq: Four times a day (QID) | ORAL | 0 refills | Status: DC

## 2016-08-21 MED ORDER — MIDAZOLAM HCL 2 MG/2ML IJ SOLN
INTRAMUSCULAR | Status: AC
  Filled 2016-08-21: qty 2

## 2016-08-21 MED ORDER — NICOTINE 14 MG/24HR TD PT24
14.0000 mg | MEDICATED_PATCH | Freq: Every day | TRANSDERMAL | 0 refills | Status: DC
Start: 2016-08-21 — End: 2016-09-17

## 2016-08-21 MED ORDER — MIDAZOLAM HCL 2 MG/2ML IJ SOLN
INTRAMUSCULAR | Status: AC | PRN
  Administered 2016-08-21: 1 mg via INTRAVENOUS

## 2016-08-21 NOTE — Sedation Documentation (Signed)
Patient is resting comfortably. 

## 2016-08-21 NOTE — Sedation Documentation (Signed)
MD made aware of K results, RN on floor aware and will let the MD directing her care know and give orders.

## 2016-08-21 NOTE — Progress Notes (Signed)
Nutrition Brief Note  Patient identified on the Malnutrition Screening Tool (MST) Report  Wt Readings from Last 15 Encounters:  08/20/16 99 lb 4.8 oz (45 kg)  07/23/16 105 lb (47.6 kg)  07/04/16 110 lb 7.2 oz (50.1 kg)  06/30/16 103 lb 1.6 oz (46.8 kg)  06/05/16 104 lb (47.2 kg)  05/25/16 102 lb 12.8 oz (46.6 kg)  05/19/16 104 lb (47.2 kg)  05/05/16 104 lb 6 oz (47.3 kg)  02/17/16 105 lb 8 oz (47.9 kg)  11/18/15 103 lb 12.8 oz (47.1 kg)  10/21/15 104 lb (47.2 kg)  10/11/15 105 lb (47.6 kg)  09/17/15 102 lb 6.4 oz (46.4 kg)  08/20/15 101 lb 8 oz (46 kg)  02/12/15 104 lb (47.2 kg)    Body mass index is 18.16 kg/m. Patient meets criteria for Underweight based on current BMI. Pt is being discharged per RN. RN reports that patient has been eating very little today.  Pt getting dressed and ready for discharge at time of visit. She reports losing a few pounds recently, but she has never weighed much and eats healthy. She tried [employer] yesterday which she liked. RD discussed the importance of adequate calorie and protein intake as well as weight maintenance. Pt interested in continuing Ensure at home. RD discussed supplement options and drinking two daily at home. Husband at bedside works at [employer] and is familiar with patients mixing Ensure, ice cream, and milk.   Current diet order is Regular. Labs and medications reviewed.   Recommended drinking Ensure Enlive or comparable supplement twice daily at home. No further nutrition interventions warranted at this time.   Scarlette Ar RD, CSP, LDN Inpatient Clinical Dietitian Pager: [phone] After Hours Pager: [phone]

## 2016-08-21 NOTE — Sedation Documentation (Addendum)
Bandaid mid upper back intact Small pneumothorax- pt asymptomatic- will get cxray later. 96% RAIR

## 2016-08-21 NOTE — Evaluation (Signed)
Speech Language Pathology Evaluation Patient Details Name: [name] MRN: [id number] DOB: 10-22-1945 Today's Date: 08/21/2016 Time: 2706-2376 SLP Time Calculation (min) (ACUTE ONLY): 30 min  Problem List:  Patient Active Problem List   Diagnosis Date Noted  . CVA (cerebral vascular accident) ([place]) 08/19/2016  . Stroke (cerebrum) ([place]) 08/19/2016  . Cerebral aneurysm, nonruptured 07/03/2016  . Right lower quadrant abdominal pain 05/20/2016  . Cervical spine disease 05/20/2016  . Frequent falls 05/05/2016  . Palpable abdominal aorta 10/11/2015  . Orthostatic hypotension 10/11/2015  . Hx of falling 08/21/2015  . Aneurysm, cerebral, nonruptured 02/12/2015  . Transient confusion 02/12/2015  . Altered mental status 06/05/2014  . New onset headache 06/05/2014  . Essential hypertension 06/05/2014  . Aneurysm ([place]) 06/05/2014  . Unsteady gait 04/30/2014  . Underweight 03/06/2014  . Borderline anemia 03/06/2014  . Other malaise and fatigue 01/01/2014  . Loss of weight 01/01/2014  . Stress 01/01/2014  . Medication management 10/06/2013  . History of back surgery 08/02/2013  . Radicular pain of left lower extremity 08/02/2013  . Elevated blood pressure reading 08/02/2013  . Encounter for Medicare annual wellness exam 08/02/2013  . Adjustment reaction with anxiety and depression 08/02/2013  . Hypothyroidism 08/02/2013  . Spinal stenosis, lumbar region, with neurogenic claudication 03/17/2013  . Herniated lumbar intervertebral disc 03/17/2013  . Wax in ear 07/19/2011  . Decreased hearing 07/19/2011  . TOBACCO USE, QUIT 06/24/2009  . ELEVATED BLOOD PRESSURE WITHOUT DIAGNOSIS OF HYPERTENSION 04/11/2009  . RHINITIS, CHRONIC 06/25/2008  . ACTINIC KERATOSIS, HEAD 06/25/2008  . HEARTBURN 09/12/2007  . LEG PAIN 06/27/2007  . OSTEOPOROSIS 06/27/2007  . Disturbance in sleep behavior 06/27/2007  . NUMBNESS 06/27/2007  . Edema 06/27/2007  . FASTING HYPERGLYCEMIA 06/27/2007  .  HYPOTHYROIDISM 04/21/2007  . ROSACEA 04/21/2007  . Osteoarthritis 04/21/2007   Past Medical History:  Past Medical History:  Diagnosis Date  . Anemia   . Anxiety   . Basal cell carcinoma of skin    "cut and burned off; nothing in years" (08/20/2016)  . Depression   . Emphysema of lung ([place]) dx'd 08/19/2016  . GERD (gastroesophageal reflux disease)    WOULD USE OTC MEDICATION IF NEEDED  . Headache    "probably monthly" (08/20/2016)  . Hypothyroidism   . Lung mass    "bx scheduled in am" (08/20/2016)  . 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 Pneumonia ~ 2010  . Rosacea   . Stroke ([place]) 08/19/2016   "right arm weaker speech issues; right face droops" (08/20/2016)  . Tachycardia    PT STATES HER HEART RATE USUALLY ABOVE 100  . TIA (transient ischemic attack)    "hx several small strokes in the past year" (08/20/2016)   Past Surgical History:  Past Surgical History:  Procedure Laterality Date  . BACK SURGERY    . BASAL CELL CARCINOMA EXCISION     "arms; forehead"  . BREAST BIOPSY Left    benign  . BUNIONECTOMY  2003   bilateral  . IR GENERIC HISTORICAL  06/05/2016   IR ANGIO VERTEBRAL SEL VERTEBRAL BILAT MOD SED 06/05/2016 Consuella Lose, MD [facility]  . IR GENERIC HISTORICAL  06/05/2016   IR ANGIO INTRA EXTRACRAN SEL INTERNAL CAROTID BILAT MOD SED 06/05/2016 Consuella Lose, MD [facility]  . IR GENERIC HISTORICAL  07/03/2016   IR ANGIOGRAM FOLLOW UP STUDY 07/03/2016 Consuella Lose, MD [facility]  . IR GENERIC HISTORICAL  07/03/2016   IR TRANSCATH/EMBOLIZ 07/03/2016 Consuella Lose, MD  [facility]  . LUMBAR LAMINECTOMY/DECOMPRESSION MICRODISCECTOMY Left 03/17/2013   Procedure: LUMBAR LAMINECTOMY/DECOMPRESSION IF RECESS STENOSIS FORAMINOTOMIE OF L4 ROOT AND L5,MICRODISECTOMY  L4-5 ON LEFT;  Surgeon: Tobi Bastos, MD;  Location: [facility];  Service: Orthopedics;  Laterality: Left;  . RADIOLOGY WITH ANESTHESIA N/A 07/03/2016   Procedure:  arteriogram, pipeline embolization of aneurysm;  Surgeon: Consuella Lose, MD;  Location: [facility];  Service: Radiology;  Laterality: N/A;  . TONSILLECTOMY  1953  . TUBAL LIGATION     HPI:  [name] an 70 y.o.femalewith history of nonruptured cerebral aneurysms S/Pendovascular treatment with placement of a pipeline stent in the left internal carotid artery on 07/23/2016 by Dr. Awanda Mink cerebral aneurysm on the right, a previous TIA / stroke, frequent falls, orthostatic static hypotension, hypertension, hypothyroidism, anxiety, depression, ongoing tobaccouse, hard of hearing, hyperglycemia, and hypothyroidismwho presented to the emergency department for evaluation of right upper quadrant pain.While here she developed right upper extremity weakness,facial droop, and dysarthria.Neurology was asked to evaluate. Last month, following her left carotid stent stent placementthe patient developed right facial droop,right upperextremity weakness, and dysarthria. The patient was placed on aspirin and Plavix howevershe was intolerant to the Plavix and it was discontinued. She underwent outpatient therapiesand showed significant improvement. Since that time the patient has had difficulties with abdominal painand has been seen in emergency department on several occasions. She presented today for further evaluation and had an abdominal ultrasound as well asa chest CT with contrast to evaluate a possible nodule.The patient's blood pressurewas noted to be lowand she was given IV fluid hydration. She also received some morphine which may have contributed to her hypotension. Pt now diagnosed with CVA (cerebrum). 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   Assessment / Plan / Recommendation Clinical Impression  Pt presents with fluctuating congitive-linguistic abilities. Evaluation limited by pt fatigue and sedation from procedure earlier in morning as well as  multiple interruptions from staff during session. Pt is alert, able to communicate orientation information and divide attention among multiple distractions. Pt's speech is intelligible, although her facial weakness is emotionally bothersome to her. Pt and daughter report some word finding deficits but not witnessed during this session. I would recommend a more comprehensive speech-language evaluation at next venue of care, preferably outpatient. Pt and family are agreeable to outpatient follow up. ST will sign-off at this time.     SLP Assessment  All further Speech Lanaguage Pathology  needs can be addressed in the next venue of care    Follow Up Recommendations  Outpatient SLP          SLP Evaluation Cognition  Overall Cognitive Status: Impaired/Different from baseline Arousal/Alertness: Suspect due to medications Orientation Level: Oriented X4 Attention: Selective Selective Attention: Appears intact Comments: Cognitive tasks limited d/t multiple staff interruptions, fatigue from recent procedure       Comprehension  Auditory Comprehension Overall Auditory Comprehension: Appears within functional limits for tasks assessed Yes/No Questions: Within Functional Limits Commands: Within Functional Limits Conversation: Simple Reading Comprehension Reading Status: Not tested    Expression Expression Primary Mode of Expression: Verbal Verbal Expression Overall Verbal Expression: Appears within functional limits for tasks assessed Initiation: No impairment Level of Generative/Spontaneous Verbalization: Sentence Repetition: No impairment Naming: No impairment Pragmatics: No impairment Non-Verbal Means of Communication: Not applicable Written Expression Dominant Hand: Right Written Expression: Not tested   Oral / Motor  Oral Motor/Sensory Function Overall Oral Motor/Sensory Function: Moderate impairment Facial ROM: Reduced right  Facial Symmetry: Abnormal symmetry right Facial  Strength: Reduced right Facial Sensation: Within Functional Limits Lingual ROM: Within Functional Limits Lingual Symmetry: Within Functional Limits Lingual Strength: Within Functional Limits Lingual Sensation: Within Functional Limits Velum: Within Functional Limits Mandible: Within Functional Limits Motor Speech Overall Motor Speech: Appears within functional limits for tasks assessed Respiration: Within functional limits Phonation: Normal Resonance: Within functional limits Articulation: Within functional limitis Intelligibility: Intelligible Motor Planning: Witnin functional limits Motor Speech Errors: Not applicable   GO            Caige Almeda B. Rutherford Nail, M.S., CCC-SLP Speech-Language Pathologist          Dewarren Ledbetter 08/21/2016, 11:51 AM

## 2016-08-21 NOTE — Progress Notes (Signed)
   Facilitated completion of Advanced Directive (AD) documentation.  Will follow, as needed.  - Rev. Power MDiv ThM

## 2016-08-21 NOTE — Progress Notes (Signed)
STROKE TEAM PROGRESS NOTE   HISTORY OF PRESENT ILLNESS (per record) [name] [name] [name] is an 70 y.o. female with history of nonruptured cerebral aneurysms S/P endovascular treatment with placement of a pipeline stent in [name] left internal carotid artery on 07/23/2016 by Dr. Kathyrn Sheriff, residual cerebral aneurysm on [name] right, a previous TIA / stroke, frequent falls, orthostatic static hypotension, hypertension, hypothyroidism, anxiety, depression, ongoing tobacco use, hard of hearing, hyperglycemia, and hypothyroidism who presented to [name] emergency department for evaluation of right upper quadrant pain. While here she developed right upper extremity weakness, facial droop, and dysarthria. Neurology was asked to evaluate.  Last month, following her left carotid stent stent placement [name] [name] [name] developed right facial droop, right upper extremity weakness, and dysarthria. [name] [name] [name] was placed on aspirin and Plavix however she was intolerant to [name] Plavix and it was discontinued. She underwent outpatient therapies and showed significant improvement. Since that time [name] [name] [name] has had difficulties with abdominal pain and has been seen in emergency department on several occasions. She presented today for further evaluation and had an abdominal ultrasound as well as a chest CT with contrast to evaluate a possible nodule. [name] [name] [name]'s blood pressure was noted to be low and she was given IV fluid hydration. She also received some morphine which may have contributed to her hypotension.   [name] [name] [name]'s husband noted that [name] [name] [name]'s speech was becoming more slurred and she was having increased right facial weakness. [name] [name] [name] also noted that her right upper extremity was weaker. She is not a candidate for TPA because of [name] stroke last month; however, it was felt that a CT angiogram could be considered to look for a large occluded vessel. Unfortunately [name] [name] [name] has already had IV contrast for her CT of [name]  chest. An MRI Head Wo Contrast is currently pending. We will await [name] results of [name] MRI and consider proceeding with [name] CT angiogram or catheter angiogram if felt to be indicated. In talking with [name] neuro radiologist he noted that since [name] [name] [name]'s creatinine was 1.18 with a GFR of 46 it may still be safe to proceed with a CT angiogram. This was discussed with Dr. Cheral Marker and [name] emergency department physician. CT Chest with contrast today C/W primary lung cancer - new diagnosis.  [name] [name] was last known well 08/19/2016 at 09:15. [name] [name] was not administered IV t-PA secondary to out of [name] therapeutic window for treatment and she had had a recent stroke.   SUBJECTIVE (INTERVAL HISTORY) Multiple family members present. Dr. Leonie Man discussed [name] [name] [name]'s case and answered questions. Arrangements are being made for lung biopsy. Further antiplatelet therapy following biopsy.   OBJECTIVE Temp:  [98 F (36.7 C)-98.5 F (36.9 C)] 98.5 F (36.9 C) (12/29 0528) Pulse Rate:  [89-98] 98 (12/29 1015) Cardiac Rhythm: Normal sinus rhythm (12/29 0917) Resp:  [4-20] 4 (12/29 1015) BP: (150-159)/(73-91) 150/91 (12/29 1015) SpO2:  [95 %-98 %] 97 % (12/29 1015) Weight:  [45 kg (99 lb 4.8 oz)] 45 kg (99 lb 4.8 oz) (12/28 1330)  CBC:   Recent Labs Lab 08/19/16 0915 08/21/16 0644  WBC 10.2 9.2  NEUTROABS 7.9* 6.8  HGB 9.5* 9.1*  HCT 30.0* 28.4*  MCV 95.5 92.2  PLT 423* 406*    Basic Metabolic Panel:   Recent Labs Lab 08/19/16 0915 08/21/16 0644  NA 135 138  K 3.4* 2.9*  CL 103 107  CO2 25 21*  GLUCOSE 149* 90  BUN 9 5*  CREATININE 1.18* 0.71  CALCIUM  8.8* 7.7*    Lipid Panel:     Component Value Date/Time   CHOL 139 08/20/2016 0718   TRIG 72 08/20/2016 0718   TRIG 75 07/12/2006 0818   HDL 51 08/20/2016 0718   CHOLHDL 2.7 08/20/2016 0718   VLDL 14 08/20/2016 0718   LDLCALC 74 08/20/2016 0718   HgbA1c:  Lab Results  Component Value Date   HGBA1C 5.4 08/20/2016   Urine  Drug Screen:     Component Value Date/Time   LABOPIA NONE DETECTED 05/04/2014 1145   COCAINSCRNUR NONE DETECTED 05/04/2014 1145   LABBENZ NONE DETECTED 05/04/2014 1145   AMPHETMU NONE DETECTED 05/04/2014 1145   THCU NONE DETECTED 05/04/2014 1145   LABBARB NONE DETECTED 05/04/2014 1145      IMAGING  Ct Chest W Contrast 08/19/2016 1. Solid mass within [name] superior segment of [name] right lower lobe apparently extending into [name] posterior inferior right upper lobe as well as [name] mediastinum and adjacent paravertebral soft tissues consistent with primary lung carcinoma of 6.9 x 7.2 x 6.6 cm.  2. Metastatic involvement of T7 and T6 vertebral body with with some involvement of posterior elements at these levels.  3. Coronary artery calcifications.    MRI HEAD w/ contrast:  08/19/2016 No abnormal enhancement.   MRA HEAD:  08/19/2016 LEFT internal carotid artery pipeline stent, patency cannot accurately be determined by MR. No emergent large vessel occlusion. 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   MRA NECK:   08/19/2016 No hemodynamically significant stenosis.   Mr Brain Wo Contrast 08/19/2016 1. Cluster of small acute infarcts around [name] left central sulcus. There also small acute and remote infarcts in [name] left MCA border zones. Recommend angiographic follow-up in this [name] [name] with ipsilateral ICA to MCA stent.  2. Focus of nonacute blood products in [name] inferior left frontal cortex is likely post ischemic given #1. When appropriate, recommend postcontrast evaluation in this [name] [name] with right hilar mass.   Dg Abd 2 Views 08/19/2016 No significant change in [name] appearance of [name] bowel gas pattern possibly representing adynamic ileus given scattered air containing nondistended small bowel loops with scattered air-fluid levels.   Dg Abd Acute W/chest 08/19/2016 1. Right parahilar mass. Recommend CT of [name] chest with contrast for further evaluation.   2. Emphysema.  3. Fluid levels within nondilated loops of large and small bowel suggesting an adynamic ileus without obstruction.  4. Progressive degenerative change and scoliosis in [name] lumbar spine.   US Abdomen Limited Ruq 08/19/2016 Negative right upper quadrant ultrasound.    PHYSICAL EXAM Mental Status: Alert, oriented, thought content appropriate.  Very dysarthric. Difficult to understand at times. Able to follow 2 step commands with cueing Cranial Nerves: II: Discs not visualized. Visual fields grossly normal, pupils equal, round, reactive to light and accommodation III,IV, VI: ptosis not present, extra-ocular motions intact bilaterally V,VII: smile asymmetric with right-sided weakness., facial light touch sensation normal bilaterally, unable to raise right eyebrow. VIII: hearing normal bilaterally IX,X: gag reflex present XI: bilateral shoulder shrug XII: midline tongue extension Motor: Right :  Upper extremity   4/5                                      Left:     Upper extremity   5/5             Lower extremity   4/5  Lower extremity   5/5 Tone and bulk:normal tone throughout; no atrophy noted.diminished fine finger movements on . Mild right grip weakness. Orbits [name] left or right upper extremity. Sensory: Light touch intact throughout, bilaterally Deep Tendon Reflexes: Normoactive. No asymmetry noted.  Plantars:  Downgoing bilaterally.  Cerebellar: Normal finger-to-nose on [name] left with cueing. Unable to perform on [name] right secondary to weakness. Normal heel-to-shin test bilaterally. Gait: Deferred CV: pulses palpable but weak in both lower extremities.    ASSESSMENT/PLAN [name] [name] [name] is a 70 y.o. female with history of nonruptured cerebral aneurysms S/P endovascular treatment with placement of a pipeline stent in [name] left internal carotid artery on 07/23/2016 by Dr. Kathyrn Sheriff, residual cerebral aneurysm on [name]  right, a previous TIA / stroke, frequent falls, orthostatic static hypotension, hypertension, hypothyroidism, anxiety, depression, ongoing tobacco use, hard of hearing, hyperglycemia, and hypothyroidism  presenting with right sided weakness and dysarthria. She did not receive IV t-PA due to recent stroke.   Strokes:  Dominant likely embolic from an unknown source.  MRI w/ and w/o - LEFT internal carotid artery pipeline stent, patency cannot accurately be determined by  MR. 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   MRA brain - Cluster of small acute infarcts around [name] left central sulcus. There also small acute and remote infarcts in [name] left MCA border zones.   MRA neck - No hemodynamically significant stenosis.   2D Echo - pending  LDL - 74  HgbA1c - pending  VTE prophylaxis - Lovenox Diet NPO time specified Except for: Sips with Meds  aspirin 325 mg daily prior to admission, now on aspirin 325 mg daily. [name] [name] had been prescribed Plavix prior to admission, but her husband. As he thought it was [name] etiology of her stroke like symptoms. She has not taken Plavix for approximately 1. She was started on Aggrenox last night is unable to take Plavix. [name] addition of Aggrenox is not beneficial in this [name] [name] with stent. Will discontinue Aggrenox. Recommend treatment with aspirin and Brillinta once lung bx completed.B  [name] [name] counseled to be compliant with her antithrombotic medications  Ongoing aggressive stroke risk factor management  Therapy recommendations: Outpatient physical and occupational therapies.  Disposition:  Pending  Hypertension  Stable  Permissive hypertension (OK if < 220/120) but gradually normalize in 5-7 days  Long-term BP goal normotensive  Hyperlipidemia  Home meds:  No lipid lowering medications prior to admission.  LDL 74 , goal < 70  Consider low-dose Pravachol - in view of malignancy workup will hold statin for  now.  Continue statin at discharge  Chest Mass  New findnig  For bx. IR recommends pulmonary  Pulmonary to consult  Ok for bx at any time from stroke standpoint. strokes are very small and not a contraindication to bx. Dr. 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 Kathyrn Sheriff  Possible hypercoagulable state due to malignancy.  Plavix discontinued secondary to [name] [name] intolerance   Other Active Problems  Hypothyroidism  "Recommend angiographic follow-up in this [name] [name] with ipsilateral ICA to MCA stent" per radiology.  Hypokalemia -3.4  Anemia-9.5 / 30   PLAN   Chest biopsy  Discuss additional anti platelet therapy  Mild hypokalemia - 3.4  Anemia - 9.5 /  [facility] day # [place] for Pager information 08/21/2016 10:28 AM   I have personally examined this [name] [name], reviewed notes, independently viewed imaging studies, participated in medical decision making and plan of care.ROS completed by me personally and pertinent positives fully documented  I have made any additions or clarifications directly to [name] above note. Agree with note above. Long discussion of [name] bedside with [name] [name] [name]'s family and answered questions. Recommend aspirin and Brilinta for [name] pipeline intracranial stent in stroke prevention and safe to be started after biopsy. Follow-up as an outpatient in 6 weeks. Follow-up with neurosurgeon Dr. Valeria Batman, MD Medical Director [place] Pager: [phone] 08/21/2016 1:15 PM   To contact Stroke Continuity provider, please refer to [email or web address] After hours, contact General Neurology

## 2016-08-21 NOTE — Care Management Note (Signed)
Case Management Note  Patient Details  Name: [name] MRN: [id number] Date of Birth: 05/14/1946  Subjective/Objective:                    Action/Plan: CM consulted for outpatient therapy. CM met with the patient and her husband and pt is already active with [facility]. CM entered orders in EPIC and information on the AVS so she can continue with her therapy. Pt already has appointments scheduled for next week. Husband is providing transportation home.   Expected Discharge Date:                  Expected Discharge Plan:  Home/Self Care  In-House Referral:     Discharge planning Services  CM Consult  Post Acute Care Choice:    Choice offered to:     DME Arranged:    DME Agency:     HH Arranged:    [place] Agency:     Status of Service:  Completed, signed off  If discussed at [employer] of Stay Meetings, dates discussed:    Additional Comments:  Pollie Friar, RN 08/21/2016, 3:12 PM

## 2016-08-21 NOTE — Progress Notes (Signed)
Discharge instructions reviewed with patient and spouse.

## 2016-08-21 NOTE — Procedures (Signed)
Interventional Radiology Procedure Note  Procedure: CT guided biopsy of RLL lung mass  Complications: Small right PTX  Estimated Blood Loss: < 10 mL  18 G core biopsy x 2 via 17 G needle of RLL mass from posterior approach.  Small posterior PTX on completion; will follow by CXR.  Venetia Night. Kathlene Cote, M.D Pager:  [phone]

## 2016-08-21 NOTE — Evaluation (Signed)
Physical Therapy Evaluation Patient Details Name: [name] MRN: [id number] DOB: Mar 29, 1946 Today's Date: 08/21/2016   History of Present Illness  Pt is a 70 y.o. female admitted from ED with abdominal pain and started having strokelike symptoms. MRI showed multiple infarcts left hemisphere. Pt now diagnosed with CVA (cerebrum). 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 PMH: tachycardia, back pain, emphysema, depression, anxiety.   Clinical Impression  Patient seen for mobility progression and high level balance assessment in preparation for d/c. At this time, patient mobilizing at supervision level, anticipate will be safe for d/c home with family supervision and outpatient follow up. Will continue current POC.    Follow Up Recommendations Outpatient PT;Supervision for mobility/OOB    Equipment Recommendations  None recommended by PT    Recommendations for Other Services       Precautions / Restrictions Precautions Precautions: Fall Restrictions Weight Bearing Restrictions: No      Mobility  Bed Mobility Overal bed mobility: Needs Assistance Bed Mobility: Sit to Supine     Supine to sit: Supervision Sit to supine: Supervision   General bed mobility comments: HOB up 30 degrees, no assist, able to pull herself up in bed  Transfers Overall transfer level: Needs assistance Equipment used: None Transfers: Sit to/from Stand Sit to Stand: Supervision         General transfer comment: supervision for safety with some impulsivity, no physical assist required  Ambulation/Gait Ambulation/Gait assistance: Supervision Ambulation Distance (Feet): 510 Feet Assistive device: None Gait Pattern/deviations: Step-through pattern;Narrow base of support     General Gait Details: modest instability, but no physical assist required  Stairs Stairs: Yes Stairs assistance: Supervision Stair Management: One rail Right;Alternating  pattern;Forwards Number of Stairs: 5 General stair comments: supervision for safety, no physicala ssist required  Wheelchair Mobility    Modified Rankin (Stroke Patients Only) Modified Rankin (Stroke Patients Only) Pre-Morbid Rankin Score: Moderate disability Modified Rankin: Moderately severe disability     Balance Overall balance assessment: Needs assistance Sitting-balance support: No upper extremity supported Sitting balance-Leahy Scale: Good       Standing balance-Leahy Scale: Fair                   Standardized Balance Assessment Standardized Balance Assessment : Dynamic Gait Index   Dynamic Gait Index Level Surface: Normal Change in Gait Speed: Mild Impairment Gait with Horizontal Head Turns: Mild Impairment Gait with Vertical Head Turns: Mild Impairment Gait and Pivot Turn: Mild Impairment Step Over Obstacle: Moderate Impairment Step Around Obstacles: Mild Impairment Steps: Mild Impairment Total Score: 16       Pertinent Vitals/Pain Pain Assessment: No/denies pain Pain Score: 0-No pain    Home Living     Available Help at Discharge: Family;Available PRN/intermittently Type of Home: House                Prior Function                 Hand Dominance   Dominant Hand: Right    Extremity/Trunk Assessment                Communication      Cognition Arousal/Alertness: Awake/alert Behavior During Therapy: WFL for tasks assessed/performed;Impulsive Overall Cognitive Status: Impaired/Different from baseline           Safety/Judgement: Decreased awareness of safety          General Comments      Exercises  Assessment/Plan    PT Assessment    PT Problem List            PT Treatment Interventions      PT Goals (Current goals can be found in the Care Plan section)  Acute Rehab PT Goals Patient Stated Goal: to go home PT Goal Formulation: With patient/family Time For Goal Achievement:  09/03/16 Potential to Achieve Goals: Good    Frequency Min 3X/week   Barriers to discharge        Co-evaluation               End of Session Equipment Utilized During Treatment: Gait belt Activity Tolerance: Patient tolerated treatment well Patient left: in bed;with call bell/phone within reach;with family/visitor present Nurse Communication: Mobility status         Time: 1281-1886 PT Time Calculation (min) (ACUTE ONLY): 18 min   Charges:     PT Treatments $Gait Training: 8-22 mins   PT G Codes:        Duncan Dull Sep 13, 2016, 2:35 PM  Alben Deeds, [place] DPT  [phone]

## 2016-08-21 NOTE — Discharge Summary (Signed)
Physician Discharge Summary     ANASTASHIA WESTERFELD JKD:[id number] DOB: 1945-11-21 DOA: 08/19/2016  PCP: [name], MD  Admit date: 08/19/2016 Discharge date: 08/21/2016  Time spent: 45 minutes  Recommendations for Outpatient Follow-up:  1. The patient should initiate aspirin 81 mg as well as Brillinta therapy for secondary prevention of CVA. 2. Will need CBC and differential in about one month 3. Needs follow up of potassium 1 week 4. Patient will need follow-up of biopsy as an outpatient and will forward this note to the oncology navigator to ensure that that follow-up is made and I will cc Dr. Regis Bill with this info 5. Will need out patient follow-up with neurology as well as her neurosurgeon Dr. Lajuana Ripple 6. The patient will need outpatient physical and speech therapy to optimize her care  Discharge Diagnoses:  Active Problems:   CVA (cerebral vascular accident) [facility])   Stroke (cerebrum) [facility])   Discharge Condition: improved  Diet recommendation: regular  Filed Weights   08/20/16 0900 08/20/16 1330  Weight: 48 kg (105 lb 12.8 oz) 45 kg (99 lb 4.8 oz)    History of present illness:  70 y/o ? 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 09/98/3382 Complication of probable CVA perioperatively Hypothyroidism Low back pain on gabapentin S/PL4-L5 discectomy Episodic confusion, chronic headaches  Admitted with abdominal pain from emergency room and then started having strokelike symptoms Neurology emergently consulted MRI showed multiple infarcts left hemisphere the patient was outside any type of operative window  Hospital Course:   Hypotension which has resolved Unclear etiology-- initially was given morphine she was placed on IV saline 100 cc She was not discharged on any further medication  Acute CVA Discussed with neurologist agrees that in the setting that patient has had this problem outside of window for any type of intervention MRI brain  1227 shows small infarcts around left central sulcus and remote infarcts L MCA MRA brain performed shows left internal carotid artery pipeline stent patency not accurately determine about the MR no emergent large vessel occlusion and 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 CT scan shows 6.9 X7.2X 6.6 mass which also has metastases IR evaluation has been requested and patient is only on aspirin Patient ultimately had biopsy 08/21/2016 with no pneumothorax on follow-up chest x-ray and was felt to be stable for discharge Patient will prop the head of procedure performed 1229 2017 is nothing by mouth May benefit from coordination with oncology navigator as an outpatient  Mild renal insufficiency` Fluids 100 cc/hr  Hypokalemia Replacing on discharge 40 mg once a day and will need labs in a week   Procedures Radiology performed a biopsy of lung mass   Consultation Neurology Interventional radiology  charge Exam: Vitals:   08/21/16 1037 08/21/16 1042  BP: (!) 142/80 (!) 145/77  Pulse: 92 93  Resp: 15 17  Temp:      General:Alert oriented slight drooping of the face Cardiovascular: S1-S2 no murmur rub or gallop respiratory: Clinically clear no added sound Abdomen is soft nontender nondistended no rebound or guarding Able to sit up and maintain truncal posture  charge Instructions   Allergies as of 08/21/2016  Reactions   Plavix [clopidogrel] Other (See Comments)   Affected concentration, arm movement, speech, and possibly caused numbness (right sided)   Prednisone Nausea Only      Medication  List    STOP taking these medications   aspirin EC 325 MG tablet Replaced by:  aspirin 81 MG chewable tablet   ibuprofen 200 MG tablet Commonly known as:  ADVIL,MOTRIN   prasugrel 5 MG Tabs tablet Commonly known as:  EFFIENT     TAKE these medications   amitriptyline 100 MG tablet Commonly known as:  ELAVIL TAKE 3 TABLETS BY MOUTH EVERY DAY What changed:  See the new instructions.   aspirin 81 MG chewable tablet Commonly known as:  ASPIRIN CHILDRENS Chew 1 tablet (81 mg total) by mouth daily. Replaces:  aspirin EC 325 MG tablet   BIOTIN PO Take 1 tablet by mouth daily.   diphenhydramine-acetaminophen 25-500 MG Tabs tablet Commonly known as:  TYLENOL PM Take 1 tablet by mouth at bedtime.   DULoxetine 30 MG capsule Commonly known as:  CYMBALTA TAKE 1 CAPSULE (30 MG TOTAL) BY MOUTH DAILY.   gabapentin 300 MG capsule Commonly known as:  NEURONTIN TAKE ONE CAPSULE IN THE DAYTIME AND 2 CAPSULES AT BEDTIME   levothyroxine 75 MCG tablet Commonly known as:  SYNTHROID TAKE 1 TABLET BY MOUTH DAILY BEFORE BREAKFAST. What changed:  how much to take  how to take this  when to take this  additional instructions   nicotine 14 mg/24hr patch Commonly known as:  NICODERM CQ Place 1 patch (14 mg total) onto the skin daily.   pantoprazole 40 MG tablet Commonly known as:  PROTONIX Take 1 tablet (40 mg total) by mouth 2 (two) times daily.   polyethylene glycol packet Commonly known as:  MIRALAX / GLYCOLAX Take 17 g by mouth daily.   potassium chloride SA 20 MEQ tablet Commonly known as:  K-DUR,KLOR-CON Take 2 tablets (40 mEq total) by mouth daily.   sucralfate 1 g tablet Commonly known as:  CARAFATE Take 1 tablet (1 g total) by mouth 4 (four) times daily -  with meals and at bedtime.   ticagrelor 90 MG Tabs tablet Commonly known as:  BRILINTA Take 1 tablet (90 mg total) by mouth 2 (two) times daily.   traMADol 50 MG tablet Commonly known as:  ULTRAM Take 1  tablet (50 mg total) by mouth every 6 (six) hours.   vitamin C with rose hips 1000 MG tablet Take 1,000 mg by mouth daily.      Allergies  Allergen Reactions  . Plavix [Clopidogrel] Other (See Comments)    Affected concentration, arm movement, speech, and possibly caused numbness (right sided)  . Prednisone Nausea Only   Follow-up Information    Christinia Gully, MD Follow up on 08/15/2017.   Specialty:  Pulmonary Disease Why:  go at 15:30 for appointment and follow-up with Pulmonology Contact information: [address] [place] [address] [phone]            The results of significant diagnostics from this hospitalization (including imaging, microbiology, ancillary and laboratory) are listed below for reference.    Significant Diagnostic Studies: Dg Chest 1 View  Result Date: 08/21/2016 CLINICAL DATA:  Post biopsy, possible pneumothorax EXAM: CHEST 1 VIEW COMPARISON:  Twelve/ 27/17 FINDINGS: Again noted right lower lobe mass. Mild right basilar atelectasis. No evidence of pneumothorax. Left lung is clear. IMPRESSION: Persistent mass in right lower lobe. Mild right basilar atelectasis. No evidence of pneumothorax. Electronically  Signed   By: Lahoma Crocker M.D.   On: 08/21/2016 13:56   Ct Chest W Contrast  Result Date: 08/19/2016 CLINICAL DATA:  Right hilar mass on chest x-ray, nausea, shortness of breath EXAM: CT CHEST WITH CONTRAST TECHNIQUE: Multidetector CT imaging of the chest was performed during intravenous contrast administration. CONTRAST:  62m ISOVUE-300 IOPAMIDOL (ISOVUE-300) INJECTION 61% COMPARISON:  Chest x-ray of 08/19/2016 FINDINGS: Cardiovascular: Coronary artery calcifications are noted diffusely, primarily involving the left anterior descending coronary artery. The heart is within normal limits in size. No pericardial effusion is seen. The mid ascending thoracic aorta measures 34 mm in diameter. The pulmonary arteries opacify with no central abnormality  noted. Mediastinum/Nodes: The mass within the superior segment right lower lobe extending in the posterior inferior right upper lobe does appear to extend into the mediastinum with loss of adjacent fat planes, suggesting direct mediastinal invasion. No definite adenopathy is seen although subcarinal nodes may well be present. Lungs/Pleura: The right perihilar mass described on chest x-ray represents a large solid mass involving the superior segment of the right lower lobe in apparently invading into the posterior inferior aspect of the right upper lobe. In its largest axial plane this mass measures 6.9 x 7.2 cm with a height of approximately 6.6 cm. There may also be direct invasion of the local paravertebral soft tissues. This lesion is consistent with primary lung carcinoma. There is an 8 mm poorly defined opacity within the right upper lobe posteriorly worrisome for metastatic lesion. Some volume loss is present posteriorly in the right lower lobe. No lesion is seen throughout the left lung. Upper Abdomen: The portion of the upper abdomen that is visualized is unremarkable. Musculoskeletal: There is blastic involvement of T7 vertebral body and to a lesser degree T6 with some involvement of posterior elements consistent with bone metastases. IMPRESSION: 1. Solid mass within the superior segment of the right lower lobe apparently extending into the posterior inferior right upper lobe as well as the mediastinum and adjacent paravertebral soft tissues consistent with primary lung carcinoma of 6.9 x 7.2 x 6.6 cm. 2. Metastatic involvement of T7 and T6 vertebral body with with some involvement of posterior elements at these levels. 3. Coronary artery calcifications. Electronically Signed   By: PIvar DrapeM.D.   On: 08/19/2016 13:45   Mr MJodene NamNeck W Wo Contrast  Result Date: 08/19/2016 CLINICAL DATA:  Follow-up acute LEFT MCA territory infarct. 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  EXAM: MRA NECK WITHOUT AND WITH CONTRAST MRA HEAD WITHOUT CONTRAST MRI HEAD WITH CONTRAST TECHNIQUE: Multiplanar and multiecho pulse sequences of the neck were obtained without and with intravenous contrast. Angiographic images of the neck were obtained using MRA technique without and with intravenous contrast.; Angiographic images of the Circle of Willis were obtained using MRA technique without intravenous contrast. Multiplanar multi P pulse sequences of the head obtained with contrast. CONTRAST:  169mMULTIHANCE GADOBENATE DIMEGLUMINE 529 MG/ML IV SOLN COMPARISON:  CT angiogram of the head May 13, 2016 and MRI head August 19, 2016 at 1646 hours FINDINGS: MRI HEAD FINDINGS No abnormal parenchymal enhancement with particular attention LEFT inferior frontal lobe at site of susceptibility artifact. No abnormal extra-axial enhancement. MRA NECK FINDINGS ANTERIOR CIRCULATION: 2 vessel arch. The common carotid arteries are widely patent bilaterally. The carotid bifurcation is patent bilaterally and there is no hemodynamically significant carotid stenosis by NASCET criteria. Tortuous RIGHT internal carotid artery. No evidence for atherosclerosis or flow limiting stenosis of the  cervical internal carotid arteries. POSTERIOR CIRCULATION: Bilateral vertebral arteries are patent to the vertebrobasilar junction. Tortuous vessels. No evidence for atherosclerosis or flow limiting stenosis. Source images and MIP image were reviewed. MRA HEAD FINDINGS ANTERIOR CIRCULATION: Normal flow related enhancement of the included cervical, petrous, RIGHT cavernous and RIGHT supraclinoid internal carotid arteries. Loss of signal LEFT cavernous and supraclinoid internal carotid artery at level of pipeline stent. 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 Patent anterior communicating artery. Normal flow related enhancement of the anterior and middle cerebral arteries, including  distal segments. POSTERIOR CIRCULATION: Codominant vertebral artery's. Basilar artery is patent, with normal flow related enhancement of the main branch vessels. Fenestrated proximal basilar artery. Bilateral posterior communicating artery present. Normal flow related enhancement of the posterior cerebral arteries. No large vessel occlusion, high-grade stenosis, abnormal luminal irregularity, aneurysm. IMPRESSION: MRI HEAD: No abnormal enhancement. MRA HEAD: LEFT internal carotid artery pipeline stent, patency cannot accurately be determined by MR. No emergent large vessel occlusion. 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 MRA NECK:  No hemodynamically significant stenosis. Electronically Signed   By: Elon Alas M.D.   On: 08/19/2016 21:50   Mr Brain Wo Contrast  Result Date: 08/19/2016 CLINICAL DATA:  Right hilar mass.  Right upper extremity weakness. EXAM: MRI HEAD WITHOUT CONTRAST TECHNIQUE: Multiplanar, multiecho pulse sequences of the brain and surrounding structures were obtained without intravenous contrast. COMPARISON:  CTA head neck 04/2016.  Brain MRI 05/04/2014 FINDINGS: Brain: There is a cluster of acute infarcts in the cortical and subcortical left frontal parietal junction along the central sulcus. Punctate cortical infarct in the superior left temporal gyrus and in the left occipital parietal region. There are interval but chronic infarcts also seen in the anterior left frontal lobe, which likely accounts for nonacute blood products in the inferior left frontal lobe. Patient will need postcontrast imaging to evaluate the focus of hemorrhage and white matter changes in this patient with right hilar mass. Microvascular ischemic change in the bilateral cerebral white matter. Rounded pontine T2 hyperintensity on the right, with hemosiderin staining. The this could reflect a capillary telangiectasia or remote infarct. No evidence of subarachnoid hemorrhage.  Vascular: Normal flow voids.  Known intracranial aneurysms. Skull and upper cervical spine: Negative Sinuses/Orbits: Negative IMPRESSION: 1. Cluster of small acute infarcts around the left central sulcus. There also small acute and remote infarcts in the left MCA border zones. Recommend angiographic follow-up in this patient with ipsilateral ICA to MCA stent. 2. Focus of nonacute blood products in the inferior left frontal cortex is likely post ischemic given #1. When appropriate, recommend postcontrast evaluation in this patient with right hilar mass. Electronically Signed   By: Monte Fantasia M.D.   On: 08/19/2016 17:17   Mr Brain W Contrast  Result Date: 08/19/2016 CLINICAL DATA:  Follow-up acute LEFT MCA territory infarct. 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 EXAM: MRA NECK WITHOUT AND WITH CONTRAST MRA HEAD WITHOUT CONTRAST MRI HEAD WITH CONTRAST TECHNIQUE: Multiplanar and multiecho pulse sequences of the neck were obtained without and with intravenous contrast. Angiographic images of the neck were obtained using MRA technique without and with intravenous contrast.; Angiographic images of the Circle of Willis were obtained using MRA technique without intravenous contrast. Multiplanar multi P pulse sequences of the head obtained with contrast. CONTRAST:  11m MULTIHANCE GADOBENATE DIMEGLUMINE 529 MG/ML IV SOLN COMPARISON:  CT angiogram of the head May 13, 2016 and MRI head  August 19, 2016 at 1646 hours FINDINGS: MRI HEAD FINDINGS No abnormal parenchymal enhancement with particular attention LEFT inferior frontal lobe at site of susceptibility artifact. No abnormal extra-axial enhancement. MRA NECK FINDINGS ANTERIOR CIRCULATION: 2 vessel arch. The common carotid arteries are widely patent bilaterally. The carotid bifurcation is patent bilaterally and there is no hemodynamically significant carotid stenosis by NASCET criteria. Tortuous RIGHT internal carotid artery.  No evidence for atherosclerosis or flow limiting stenosis of the cervical internal carotid arteries. POSTERIOR CIRCULATION: Bilateral vertebral arteries are patent to the vertebrobasilar junction. Tortuous vessels. No evidence for atherosclerosis or flow limiting stenosis. Source images and MIP image were reviewed. MRA HEAD FINDINGS ANTERIOR CIRCULATION: Normal flow related enhancement of the included cervical, petrous, RIGHT cavernous and RIGHT supraclinoid internal carotid arteries. Loss of signal LEFT cavernous and supraclinoid internal carotid artery at level of pipeline stent. 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 Patent anterior communicating artery. Normal flow related enhancement of the anterior and middle cerebral arteries, including distal segments. POSTERIOR CIRCULATION: Codominant vertebral artery's. Basilar artery is patent, with normal flow related enhancement of the main branch vessels. Fenestrated proximal basilar artery. Bilateral posterior communicating artery present. Normal flow related enhancement of the posterior cerebral arteries. No large vessel occlusion, high-grade stenosis, abnormal luminal irregularity, aneurysm. IMPRESSION: MRI HEAD: No abnormal enhancement. MRA HEAD: LEFT internal carotid artery pipeline stent, patency cannot accurately be determined by MR. No emergent large vessel occlusion. 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 MRA NECK:  No hemodynamically significant stenosis. Electronically Signed   By: Elon Alas M.D.   On: 08/19/2016 21:50   Ct Abdomen Pelvis W Contrast  Result Date: 07/23/2016 CLINICAL DATA:  Right lower quadrant pain for several months, no nausea or vomiting EXAM: CT ABDOMEN AND PELVIS WITH CONTRAST TECHNIQUE: Multidetector CT imaging of the abdomen and pelvis was performed using the standard protocol following bolus administration of intravenous contrast.  CONTRAST:  1 ISOVUE-300 IOPAMIDOL (ISOVUE-300) INJECTION 61% COMPARISON:  None. FINDINGS: Lower chest: Lung bases shows small right pleural effusion. There is streaky atelectasis or infiltrate in right base posteriorly. Hepatobiliary: Enhanced liver shows mild intrahepatic biliary ductal dilatation. No calcified gallstones are noted within gallbladder. CBD measures 9 mm in diameter. Pancreas: Enhanced pancreas is atrophic. Spleen: Enhanced spleen is normal. Adrenals/Urinary Tract: No adrenal gland mass. Enhanced kidneys are symmetrical in size. There is segmental area of cortical decreased attenuation in mid and lower pole of the right kidney. There is mild cortical/ capsular depression at this level. Findings probable due to scarring rather than segmental pyelonephritis. Clinical correlation is necessary. Please see coronal image 58. Delayed renal images shows bilateral renal symmetrical excretion. Bilateral visualized proximal ureter is unremarkable. There is a distended urinary bladder without bladder filling defects. Stomach/Bowel: No gastric outlet obstruction. No small bowel obstruction. Oral contrast material was given to the patient. Mild distended distal small bowel loops with some contrast air-fluid level. Findings most likely due to ileus. No thickening of small bowel wall. Abundant stool noted in right colon transverse colon descending colon and proximal sigmoid colon. No pericecal inflammation. Normal appendix is partially visualized in coronal image 44. Vascular/Lymphatic: Atherosclerotic calcifications of abdominal aorta and iliac arteries. No aortic aneurysm. No adenopathy. Reproductive: No pelvic masses noted. The uterus is mild anteflexed. Uterus is normal size. No adnexal mass. No inguinal adenopathy is noted. Other: No ascites or free abdominal air. Musculoskeletal: No destructive bony lesions are noted. Significant disc space flattening  with endplate sclerotic changes endplate irregularity mild  anterior and mild posterior spurring at L4-L5 level. Moderate disc space flattening with vacuum disc phenomenon mild anterior spurring at L3-L4 level. IMPRESSION: 1. There is small right pleural effusion with right base posterior atelectasis or infiltrate. 2. Mild intrahepatic and extrahepatic biliary ductal dilatation. No calcified gallstones are noted within gallbladder. 3. There is segmental area of decreased attenuation in the cortex of the right kidney midpole and lower pole. This area measures about 4 cm. As there is some cortical/ subcapsular retraction I suspect this represents scarring rather than acute pyelonephritis. Clinical correlation is necessary. Bilateral renal symmetrical excretion. 4. Abundant colonic stool noted right colon transverse colon and descending colon. 5. Mild distal distension of the small bowel with some contrast air levels most likely mild ileus. No small bowel obstruction. 6. Atherosclerotic calcifications of abdominal aorta and iliac arteries. 7. Degenerative changes lumbar spine L3-L4 and L4-L5 level. 8. There is a distended urinary bladder without bladder filling defects. 9. No hydronephrosis or hydroureter. Electronically Signed   By: Lahoma Crocker M.D.   On: 07/23/2016 16:11   Ct Biopsy  Result Date: 08/21/2016 CLINICAL DATA:  Presentation with 7 cm right lower lobe lung mass. EXAM: CT GUIDED CORE BIOPSY OF RIGHT LUNG MASS ANESTHESIA/SEDATION: 1.0 mg IV Versed; 50 mcg IV Fentanyl Total Moderate Sedation Time:  15 minutes. The patient's level of consciousness and physiologic status were continuously monitored during the procedure by Radiology nursing. PROCEDURE: The procedure risks, benefits, and alternatives were explained to the patient. Questions regarding the procedure were encouraged and answered. The patient understands and consents to the procedure. A time-out was performed prior to the procedure. The posterior right chest wall was prepped with chlorhexidine in a  sterile fashion, and a sterile drape was applied covering the operative field. A sterile gown and sterile gloves were used for the procedure. Local anesthesia was provided with 1% Lidocaine. CT was performed through the chest in a prone position. Under CT guidance, a 17 gauge needle was advanced into the posterior right lower lobe at the level of a right lower lobe lung mass. Two separate 18 gauge coaxial core biopsy samples were obtained. Additional imaging was performed after biopsy. COMPLICATIONS: Small right pneumothorax.  SIR level A: No therapy, no consequence. FINDINGS: Large posterior right lower lobe lung mass again noted measuring approximately 7 cm in greatest diameter. Solid tissue was obtained from the mass. Postprocedural imaging shows a small posterior pneumothorax on completion. This will be followed by chest x-ray. The patient was asymptomatic after the procedure with normal oxygen saturation. IMPRESSION: CT-guided core biopsy performed of a right lower lobe lung mass. The procedure was complicated by a small posterior right pneumothorax. This will be followed by chest x-ray. Electronically Signed   By: Aletta Edouard M.D.   On: 08/21/2016 12:02   Dg Abd 2 Views  Result Date: 08/19/2016 CLINICAL DATA:  Small-bowel obstruction EXAM: ABDOMEN - 2 VIEW COMPARISON:  1134 hours on the same day FINDINGS: Scattered nondistended air containing small bowel loops with a few scattered air-fluid levels are again noted suggesting adynamic ileus contrast opacifies the urinary bladder. There is lower lumbar degenerative disc disease from L3 through S1. No free air is identified. Right hilar mass is again seen. IMPRESSION: No significant change in the appearance of the bowel gas pattern possibly representing adynamic ileus given scattered air containing nondistended small bowel loops with scattered air-fluid levels. Right hilar masslike abnormality is again seen. Electronically Signed  By: Ashley Royalty M.D.    On: 08/19/2016 21:40   Dg Abd Acute W/chest  Result Date: 08/19/2016 CLINICAL DATA:  Abdominal pain intermittently over the last 1 year. EXAM: DG ABDOMEN ACUTE W/ 1V CHEST COMPARISON:  Two-view chest x-ray 03/16/2013. FINDINGS: The heart size is normal. A right perihilar mass is suspected. Aortic atherosclerosis is present. Emphysematous changes are again noted. No focal airspace disease is present. Fluid levels are present within nondilated loops of large and small bowel. The no obstruction or free air. Progressive scoliosis is present at L3-4 and L4-5 with asymmetric endplate change on the right at L3-4 and on the left at L4-5. IMPRESSION: 1. Right parahilar mass. Recommend CT of the chest with contrast for further evaluation. 2. Emphysema. 3. Fluid levels within nondilated loops of large and small bowel suggesting an adynamic ileus without obstruction. 4. Progressive degenerative change and scoliosis in the lumbar spine. These results were called by telephone at the time of interpretation on 08/19/2016 at 11:59 am to Dr. Tanna Furry , who verbally acknowledged these results. Electronically Signed   By: San Morelle M.D.   On: 08/19/2016 11:59   Mr Jodene Nam Head/brain FT Cm  Result Date: 08/19/2016 CLINICAL DATA:  Follow-up acute LEFT MCA territory infarct. 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 EXAM: MRA NECK WITHOUT AND WITH CONTRAST MRA HEAD WITHOUT CONTRAST MRI HEAD WITH CONTRAST TECHNIQUE: Multiplanar and multiecho pulse sequences of the neck were obtained without and with intravenous contrast. Angiographic images of the neck were obtained using MRA technique without and with intravenous contrast.; Angiographic images of the Circle of Willis were obtained using MRA technique without intravenous contrast. Multiplanar multi P pulse sequences of the head obtained with contrast. CONTRAST:  72m MULTIHANCE GADOBENATE DIMEGLUMINE 529 MG/ML IV SOLN COMPARISON:  CT angiogram  of the head May 13, 2016 and MRI head August 19, 2016 at 1646 hours FINDINGS: MRI HEAD FINDINGS No abnormal parenchymal enhancement with particular attention LEFT inferior frontal lobe at site of susceptibility artifact. No abnormal extra-axial enhancement. MRA NECK FINDINGS ANTERIOR CIRCULATION: 2 vessel arch. The common carotid arteries are widely patent bilaterally. The carotid bifurcation is patent bilaterally and there is no hemodynamically significant carotid stenosis by NASCET criteria. Tortuous RIGHT internal carotid artery. No evidence for atherosclerosis or flow limiting stenosis of the cervical internal carotid arteries. POSTERIOR CIRCULATION: Bilateral vertebral arteries are patent to the vertebrobasilar junction. Tortuous vessels. No evidence for atherosclerosis or flow limiting stenosis. Source images and MIP image were reviewed. MRA HEAD FINDINGS ANTERIOR CIRCULATION: Normal flow related enhancement of the included cervical, petrous, RIGHT cavernous and RIGHT supraclinoid internal carotid arteries. Loss of signal LEFT cavernous and supraclinoid internal carotid artery at level of pipeline stent. 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 Patent anterior communicating artery. Normal flow related enhancement of the anterior and middle cerebral arteries, including distal segments. POSTERIOR CIRCULATION: Codominant vertebral artery's. Basilar artery is patent, with normal flow related enhancement of the main branch vessels. Fenestrated proximal basilar artery. Bilateral posterior communicating artery present. Normal flow related enhancement of the posterior cerebral arteries. No large vessel occlusion, high-grade stenosis, abnormal luminal irregularity, aneurysm. IMPRESSION: MRI HEAD: No abnormal enhancement. MRA HEAD: LEFT internal carotid artery pipeline stent, patency cannot accurately be determined by MR. No emergent large vessel  occlusion. 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 MRA NECK:  No hemodynamically significant stenosis. Electronically Signed   By: CElon Alas  M.D.   On: 08/19/2016 21:50   US Abdomen Limited Ruq  Result Date: 08/19/2016 CLINICAL DATA:  70 year old female with right upper quadrant abdominal pain and nausea since yesterday. Initial encounter. EXAM: US ABDOMEN LIMITED - RIGHT UPPER QUADRANT COMPARISON:  CT Abdomen and Pelvis 07/23/2016 FINDINGS: Gallbladder: No gallstones or wall thickening visualized. No sonographic Murphy sign noted by sonographer. Common bile duct: Diameter: 5 mm, normal Liver: Background liver echogenicity is within normal limits. An apparent small echogenic focus with shadowing in the right liver on image 29 is not correlated on the recent CT Abdomen and Pelvis and is probably an artifact or pseudo-lesion. No discrete liver lesion. Other findings: Negative sonographic appearance of the visible right kidney (image 40). IMPRESSION: Negative right upper quadrant ultrasound. Electronically Signed   By: Genevie Ann M.D.   On: 08/19/2016 10:49    Microbiology: No results found for this or any previous visit (from the past 240 hour(s)).   Labs: Basic Metabolic Panel:  Recent Labs Lab 08/19/16 0915 08/21/16 0644  NA 135 138  K 3.4* 2.9*  CL 103 107  CO2 25 21*  GLUCOSE 149* 90  BUN 9 5*  CREATININE 1.18* 0.71  CALCIUM 8.8* 7.7*   Liver Function Tests:  Recent Labs Lab 08/19/16 0915  AST 17  ALT 7*  ALKPHOS 82  BILITOT 0.1*  PROT 6.2*  ALBUMIN 2.7*    Recent Labs Lab 08/19/16 0915  LIPASE 52*   No results for input(s): AMMONIA in the last 168 hours. CBC:  Recent Labs Lab 08/19/16 0915 08/21/16 0644  WBC 10.2 9.2  NEUTROABS 7.9* 6.8  HGB 9.5* 9.1*  HCT 30.0* 28.4*  MCV 95.5 92.2  PLT 423* 406*   Cardiac Enzymes: No results for input(s): CKTOTAL, CKMB, CKMBINDEX, TROPONINI in the last 168 hours. BNP: BNP (last 3  results) No results for input(s): BNP in the last 8760 hours.  ProBNP (last 3 results) No results for input(s): PROBNP in the last 8760 hours.  CBG: No results for input(s): GLUCAP in the last 168 hours.     SignedNita Sells  [facility] 08/21/2016, 2:29 PM

## 2016-08-25 ENCOUNTER — Encounter: Payer: Self-pay | Admitting: Internal Medicine

## 2016-08-25 ENCOUNTER — Ambulatory Visit (INDEPENDENT_AMBULATORY_CARE_PROVIDER_SITE_OTHER): Payer: Medicare Other | Admitting: Internal Medicine

## 2016-08-25 ENCOUNTER — Ambulatory Visit: Payer: Medicare Other | Admitting: Physical Therapy

## 2016-08-25 ENCOUNTER — Ambulatory Visit: Payer: Medicare Other | Admitting: Occupational Therapy

## 2016-08-25 VITALS — BP 136/86 | Temp 96.9°F | Ht 62.0 in | Wt 92.0 lb

## 2016-08-25 DIAGNOSIS — R918 Other nonspecific abnormal finding of lung field: Secondary | ICD-10-CM | POA: Insufficient documentation

## 2016-08-25 DIAGNOSIS — Z79899 Other long term (current) drug therapy: Secondary | ICD-10-CM

## 2016-08-25 DIAGNOSIS — E876 Hypokalemia: Secondary | ICD-10-CM

## 2016-08-25 DIAGNOSIS — D649 Anemia, unspecified: Secondary | ICD-10-CM

## 2016-08-25 DIAGNOSIS — G459 Transient cerebral ischemic attack, unspecified: Secondary | ICD-10-CM | POA: Diagnosis not present

## 2016-08-25 DIAGNOSIS — Z09 Encounter for follow-up examination after completed treatment for conditions other than malignant neoplasm: Secondary | ICD-10-CM

## 2016-08-25 DIAGNOSIS — R1011 Right upper quadrant pain: Secondary | ICD-10-CM

## 2016-08-25 DIAGNOSIS — G8191 Hemiplegia, unspecified affecting right dominant side: Secondary | ICD-10-CM

## 2016-08-25 LAB — CBC WITH DIFFERENTIAL/PLATELET
BASOS PCT: 0.3 % (ref 0.0–3.0)
Basophils Absolute: 0 10*3/uL (ref 0.0–0.1)
EOS ABS: 0.2 10*3/uL (ref 0.0–0.7)
Eosinophils Relative: 2.6 % (ref 0.0–5.0)
HCT: 31.8 % — ABNORMAL LOW (ref 36.0–46.0)
HEMOGLOBIN: 10.6 g/dL — AB (ref 12.0–15.0)
Lymphocytes Relative: 10.2 % — ABNORMAL LOW (ref 12.0–46.0)
Lymphs Abs: 0.9 10*3/uL (ref 0.7–4.0)
MCHC: 33.2 g/dL (ref 30.0–36.0)
MCV: 92.2 fl (ref 78.0–100.0)
Monocytes Absolute: 0.5 10*3/uL (ref 0.1–1.0)
Monocytes Relative: 5.5 % (ref 3.0–12.0)
NEUTROS PCT: 81.4 % — AB (ref 43.0–77.0)
Neutro Abs: 7 10*3/uL (ref 1.4–7.7)
Platelets: 503 10*3/uL — ABNORMAL HIGH (ref 150.0–400.0)
RBC: 3.45 Mil/uL — AB (ref 3.87–5.11)
RDW: 14.5 % (ref 11.5–15.5)
WBC: 8.6 10*3/uL (ref 4.0–10.5)

## 2016-08-25 LAB — BASIC METABOLIC PANEL
BUN: 6 mg/dL (ref 6–23)
CO2: 24 mEq/L (ref 19–32)
CREATININE: 0.94 mg/dL (ref 0.40–1.20)
Calcium: 9.1 mg/dL (ref 8.4–10.5)
Chloride: 104 mEq/L (ref 96–112)
GFR: 62.74 mL/min (ref >60.00)
Glucose, Bld: 90 mg/dL (ref 70–99)
Potassium: 4.4 mEq/L (ref 3.5–5.1)
Sodium: 136 mEq/L (ref 135–145)

## 2016-08-25 LAB — MAGNESIUM: Magnesium: 1.7 mg/dL (ref 1.5–2.5)

## 2016-08-25 LAB — FERRITIN: Ferritin: 32.4 ng/mL (ref 10.0–291.0)

## 2016-08-25 NOTE — Patient Instructions (Signed)
Checking  Potassium magnesium  And anemia check   May need to add iron supplement.   Plan follow up  Depending on  Lab etc  If needed for NERVE pain  can add gabapentin but can cause  Drowsiness and  Fogginess  Risk of falling   An issue .  Since off this time  .

## 2016-08-25 NOTE — Progress Notes (Signed)
Chief Complaint  Patient presents with  . Hospitalization Follow-up    HPI: Patient  [name]  71 y.o. comes in today [facility]  For what turns out to be CVA mini strokes and new diagnosis of a right lung mass recently biopsied. Her husband is with her today. States that she had been to the ED and then hospitalized after she developed right-sided weakness after the neurosurgical procedure for her aneurysm repair. She was in OT PT and improved and then had severe right upper quadrant pain that she is happy before and went to the emergency room. 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 Since she has been home she's been somewhat weak fell one time no fracture is set up for OT PT hasn't had a speech evaluation occasional drooling but generally can eat. She is getting better but doesn't have fine motor recovery of her right hand.   tramadoll every 6 hours problems occur when she can't sleep. Off the gabapentin   . Since the hospital   She's been placed on Carafate consideration of peptic ulcer or gastric ulcer disease regarding her abdominal pain. She states that her breathing is no different husband feels that she doesn't eat very much but does try. That she's just not hungry.  At this time husband goes to work in the evening children come in the day and check on her. Health Maintenance  Topic Date Due  . Hepatitis C Screening  02-15-1946  . ZOSTAVAX  08/08/2007  . DEXA SCAN  08/07/2012  . TETANUS/TDAP  05/07/2015  . MAMMOGRAM  09/23/2017  . COLONOSCOPY  09/27/2022  . INFLUENZA VACCINE  Addressed  . PNA vac Low Risk Adult  Completed     ROS:  GEN/ HEENT: No fever, sweats headaches vision problems hearing changes, CV/ PULM; No chest pain shortness of breath cough, syncope,edema  GI /GU:As per history of present illness SKIN/HEME: ,no acute skin rashes suspicious lesions or bleeding. No lymphadenopathy, nodules,  masses.  NEURO/ PSYCH:  See history of present illness. No depression anxiety.  Some stress  IMM/ Allergy: No unusual infections.  Allergy .   REST of 12 system review negative except as per HPI   Past Medical History:  Diagnosis Date  . Anemia   . Anxiety   . Basal cell carcinoma of skin    "cut and burned off; nothing in years" (08/20/2016)  . Depression   . Emphysema of lung ([place]) dx'd 08/19/2016  . GERD (gastroesophageal reflux disease)    WOULD USE OTC MEDICATION IF NEEDED  . Headache    "probably monthly" (08/20/2016)  . Hypothyroidism   . Lung mass    "bx scheduled in am" (08/20/2016)  . 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 Pneumonia ~ 2010  . Rosacea   . Stroke ([place]) 08/19/2016   "right arm weaker speech issues; right face droops" (08/20/2016)  . Tachycardia    PT STATES HER HEART RATE USUALLY ABOVE 100  . TIA (transient ischemic attack)    "hx several small strokes in the past year" (08/20/2016)    Past Surgical History:  Procedure Laterality Date  . BACK SURGERY    . BASAL CELL CARCINOMA EXCISION     "arms; forehead"  . BREAST BIOPSY Left    benign  . BUNIONECTOMY  2003   bilateral  . IR GENERIC HISTORICAL  06/05/2016   IR ANGIO  VERTEBRAL SEL VERTEBRAL BILAT MOD SED 06/05/2016 Consuella Lose, MD [facility]  . IR GENERIC HISTORICAL  06/05/2016   IR ANGIO INTRA EXTRACRAN SEL INTERNAL CAROTID BILAT MOD SED 06/05/2016 Consuella Lose, MD [facility]  . IR GENERIC HISTORICAL  07/03/2016   IR ANGIOGRAM FOLLOW UP STUDY 07/03/2016 Consuella Lose, MD [facility]  . IR GENERIC HISTORICAL  07/03/2016   IR TRANSCATH/EMBOLIZ 07/03/2016 Consuella Lose, MD [facility]  . LUMBAR LAMINECTOMY/DECOMPRESSION MICRODISCECTOMY Left 03/17/2013   Procedure: LUMBAR LAMINECTOMY/DECOMPRESSION IF RECESS STENOSIS FORAMINOTOMIE OF L4 ROOT AND L5,MICRODISECTOMY  L4-5 ON LEFT;  Surgeon: Tobi Bastos, MD;  Location: [facility];  Service: Orthopedics;   Laterality: Left;  . RADIOLOGY WITH ANESTHESIA N/A 07/03/2016   Procedure: arteriogram, pipeline embolization of aneurysm;  Surgeon: Consuella Lose, MD;  Location: [facility];  Service: Radiology;  Laterality: N/A;  . TONSILLECTOMY  1953  . TUBAL LIGATION      Family History  Problem Relation Age of Onset  . Pancreatic cancer Brother   . Heart disease      Social History   Social History  . Marital status: Married    Spouse name: N/A  . Number of children: N/A  . Years of education: N/A   Social History Main Topics  . Smoking status: Current Every Day Smoker    Packs/day: 0.50    Years: 52.00    Types: Cigarettes  . Smokeless tobacco: Never Used  . Alcohol use 0.0 oz/week     Comment: 08/20/2016 "maybe once/year"  . Drug use: No  . Sexual activity: Not Asked   Other Topics Concern  . None   Social History Narrative   Occupation:  Advice worker   Works about 30 hours a week to 15 hours    [place] getting a divorce    Married.    No health insurance self employed  Now on medicare    2 dogs     Outpatient Medications Prior to Visit  Medication Sig Dispense Refill  . amitriptyline (ELAVIL) 100 MG tablet TAKE 3 TABLETS BY MOUTH EVERY DAY (Patient taking differently: Take 300 mg by mouth at bedtime) 270 tablet 1  . aspirin (ASPIRIN CHILDRENS) 81 MG chewable tablet Chew 1 tablet (81 mg total) by mouth daily.    . diphenhydramine-acetaminophen (TYLENOL PM) 25-500 MG TABS tablet Take 1 tablet by mouth at bedtime.    . DULoxetine (CYMBALTA) 30 MG capsule TAKE 1 CAPSULE (30 MG TOTAL) BY MOUTH DAILY. 30 capsule 0  . levothyroxine (SYNTHROID) 75 MCG tablet TAKE 1 TABLET BY MOUTH DAILY BEFORE BREAKFAST. (Patient taking differently: Take 75 mcg by mouth daily before breakfast. ) 90 tablet 3  . nicotine (NICODERM CQ) 14 mg/24hr patch Place 1 patch (14 mg total) onto the skin daily. 28 patch 0  . polyethylene glycol (MIRALAX / GLYCOLAX) packet Take  17 g by mouth daily.     . potassium chloride SA (K-DUR,KLOR-CON) 20 MEQ tablet Take 2 tablets (40 mEq total) by mouth daily. 30 tablet 0  . sucralfate (CARAFATE) 1 g tablet Take 1 tablet (1 g total) by mouth 4 (four) times daily -  with meals and at bedtime. 90 tablet 0  . ticagrelor (BRILINTA) 90 MG TABS tablet Take 1 tablet (90 mg total) by mouth 2 (two) times daily. 60 tablet 6  . traMADol (ULTRAM) 50 MG tablet Take 1 tablet (50 mg total) by mouth every 6 (six) hours. 30 tablet 0  .  gabapentin (NEURONTIN) 300 MG capsule TAKE ONE CAPSULE IN THE DAYTIME AND 2 CAPSULES AT BEDTIME (Patient not taking: Reported on 08/25/2016) 270 capsule 0  . pantoprazole (PROTONIX) 40 MG tablet Take 1 tablet (40 mg total) by mouth 2 (two) times daily. 60 tablet 0  . Ascorbic Acid (VITAMIN C WITH ROSE HIPS) 1000 MG tablet Take 1,000 mg by mouth daily.    Marland Kitchen BIOTIN PO Take 1 tablet by mouth daily.     No facility-administered medications prior to visit.      EXAM:  BP 136/86 (BP Location: Right Arm, Patient Position: Sitting, Cuff Size: Normal)   Temp (!) 96.9 F (36.1 C) (Temporal)   Ht '5\' 2"'$  (1.575 m)   Wt 92 lb (41.7 kg)   BMI 16.83 kg/m   Body mass index is 16.83 kg/m.  Physical Exam: Vital signs reviewedWell-developed slender quiet white female with some mild right facial droop in no acute distress. No obvious bruising or bleeding. EOMs appear full. Neck without masses or adenopathy she tilts her head to the right. Chest clear to auscultation no respiratory distress. S1-S2 no gallops or murmurs are heard pulses are present. Normal capillary refill. No bony spine tenderness on palpation. Abdomen soft without organomegaly guarding or rebound is obvious she points to the right upper quadrant right up under the rib cage is the area of discomfort. Skin normal capillary refill abrasion on the left upper shin. Neurologic right hemi-pears cysts hand face otherwise ambulatory with caution. No foot  drop.   Lab Results  Component Value Date   WBC 8.6 08/25/2016   HGB 10.6 (L) 08/25/2016   HCT 31.8 (L) 08/25/2016   PLT 503.0 (H) 08/25/2016   GLUCOSE 90 08/25/2016   CHOL 139 08/20/2016   TRIG 72 08/20/2016   HDL 51 08/20/2016   LDLDIRECT 132.5 06/23/2010   LDLCALC 74 08/20/2016   ALT 7 (L) 08/19/2016   AST 17 08/19/2016   NA 136 08/25/2016   K 4.4 08/25/2016   CL 104 08/25/2016   CREATININE 0.94 08/25/2016   BUN 6 08/25/2016   CO2 24 08/25/2016   TSH 1.63 02/13/2016   INR 1.06 08/20/2016   HGBA1C 5.4 08/20/2016  Review of hospital records shows hypokalemia of 2.9 and a hemoglobin in the 9.7 range. She has been taking 2 potassium pills per day since hospital discharge. ASSESSMENT AND PLAN:  Discussed the following assessment and plan:  Transient cerebral ischemia, unspecified type  Hypokalemia - Plan: Basic metabolic panel, CBC with Differential/Platelet, Ferritin, Magnesium  Anemia, unspecified type - Plan: Basic metabolic panel, CBC with Differential/Platelet, Ferritin, Magnesium  Lung mass - bx pending susp for lung ca   Right hemiparesis (HCC)  RUQ abdominal pain - ? From atypical radiation from her chest versus a GI cause.  Medication management - Discussed current medicines usage at this point she is off the gabapentin some concern of risk of adding it back at this time we'll follow.  Hospital discharge follow-up Discussed coordination of care suppose we need the biopsy results back first and then plans. Discussed possibility of home health OT PT because she just got out of the hospital however this point she will try to do this as an outpatient. Discussed nutrition falling risk home support. Situation Total visit 70mns > 50% spent counseling and coordinating care as indicated in above note and in instructions to patient .     Patient Care Team: WBurnis Medin MD as PCP - General RSuella Broad MD as Consulting  Physician (Physical Medicine and  Rehabilitation) Latanya Maudlin, MD as Consulting Physician (Orthopedic Surgery) Cameron Sprang, MD as Consulting Physician (Neurology) Patient Instructions  Checking  Potassium magnesium  And anemia check   May need to add iron supplement.   Plan follow up  Depending on  Lab etc  If needed for NERVE pain  can add gabapentin but can cause  Drowsiness and  Fogginess  Risk of falling   An issue .  Since off this time  .     Standley Brooking. Panosh M.D.

## 2016-08-25 NOTE — Progress Notes (Signed)
Pre visit review using our clinic review tool, if applicable. No additional management support is needed unless otherwise documented below in the visit note. 

## 2016-08-26 ENCOUNTER — Telehealth: Payer: Self-pay | Admitting: Internal Medicine

## 2016-08-26 NOTE — Telephone Encounter (Signed)
Spoke to Mr. Michelini.  They would like the results of the biopsy that was ordered in the hospital.

## 2016-08-26 NOTE — Telephone Encounter (Signed)
Pt husband  Needs ct scan  biopsy done 08/21/16 at [facility] and lab results etc

## 2016-08-27 ENCOUNTER — Encounter: Payer: Self-pay | Admitting: Physical Therapy

## 2016-08-27 ENCOUNTER — Encounter: Payer: Self-pay | Admitting: *Deleted

## 2016-08-27 ENCOUNTER — Telehealth: Payer: Self-pay | Admitting: Internal Medicine

## 2016-08-27 ENCOUNTER — Ambulatory Visit: Payer: Medicare Other | Admitting: *Deleted

## 2016-08-27 ENCOUNTER — Ambulatory Visit: Payer: Medicare Other

## 2016-08-27 ENCOUNTER — Encounter: Payer: Self-pay | Admitting: Internal Medicine

## 2016-08-27 ENCOUNTER — Ambulatory Visit: Payer: Medicare Other | Attending: Neurosurgery | Admitting: Physical Therapy

## 2016-08-27 DIAGNOSIS — R2689 Other abnormalities of gait and mobility: Secondary | ICD-10-CM | POA: Insufficient documentation

## 2016-08-27 DIAGNOSIS — I69818 Other symptoms and signs involving cognitive functions following other cerebrovascular disease: Secondary | ICD-10-CM

## 2016-08-27 DIAGNOSIS — M6281 Muscle weakness (generalized): Secondary | ICD-10-CM | POA: Insufficient documentation

## 2016-08-27 DIAGNOSIS — R296 Repeated falls: Secondary | ICD-10-CM | POA: Diagnosis not present

## 2016-08-27 DIAGNOSIS — R278 Other lack of coordination: Secondary | ICD-10-CM

## 2016-08-27 DIAGNOSIS — R41841 Cognitive communication deficit: Secondary | ICD-10-CM

## 2016-08-27 DIAGNOSIS — R918 Other nonspecific abnormal finding of lung field: Secondary | ICD-10-CM

## 2016-08-27 DIAGNOSIS — R471 Dysarthria and anarthria: Secondary | ICD-10-CM | POA: Insufficient documentation

## 2016-08-27 NOTE — Progress Notes (Signed)
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  She had a CT scan done and was found to have a right lung mass.  Biopsies revealed:   08/21/16 Diagnosis Lung, needle/core biopsy(ies), Right lower lobe - ADENOCARCINOMA.  Tobacco/Marijuana/Snuff/ETOH use: current smoker, smokes 5-6 cigarettes per day - 0.5 ppd for 52 years, denies Marijuana, snuff, ETOH use.  Past/Anticipated interventions by cardiothoracic surgery, if any: has an apt with Dr. Melvyn Novas on 09/15/16  Past/Anticipated interventions by medical oncology, if any: apt with Dr. Julien Nordmann next Tuesday.  Signs/Symptoms  Weight changes, if any: has lost 10 lbs  Respiratory complaints, if any: she has a dry cough  Hemoptysis, if any: no  Pain issues, if any:  Yes - has pain under her right rib cage and in her lower back.  SAFETY ISSUES:  Prior radiation? no  Pacemaker/ICD? no   Possible current pregnancy?no  Is the patient on methotrexate? no  Current Complaints / other details:  CT scan from 08/19/16 showed "metastatic involvement of T7 and T6 vertebral body with with some involvement of posterior elements at these levels."  Patient is here with her husband.  She is taking tramadol for pain and has requested a refill on Tramadol.  She also said she had a stroke and has some right arm weakness.  BP 138/79 (BP Location: Right Arm, Patient Position: Sitting)   Pulse 96   Temp 97.9 F (36.6 C) (Oral)   Ht '5\' 2"'$  (1.575 m)   Wt 93 lb 12.8 oz (42.5 kg)   SpO2 97%   BMI 17.16 kg/m    Wt Readings from Last 3 Encounters:  08/28/16 93 lb 12.8 oz (42.5 kg)  08/25/16 92 lb (41.7 kg)  08/20/16 99 lb 4.8 oz (45 kg)

## 2016-08-27 NOTE — Telephone Encounter (Signed)
Spoke with husband  Mr Kopper Per cell as patient is at PT this am and NA at time of call   Officially dx of adenocarcinoma of lung and further testing to be done  And intervention with multi disciplinary  Approach .  That if the  Spine lesions are metastatic it is advanced cancer . sometimes a pet scan is done to confirm  If cancerous lesions.    They had  Not been contacted about the results of the biopsy by ordering medical team .. Results  reviewed by me on line late last  night  .  They were contacted  By cancer center  This am for appt next week.   I was unaware of this and that  They  didn't know the pathology results   at that time.  Apology that this happened. This way .   Her ruq and abd pain is better  Felt to be from using the stomach ulcer medication and just needed tylenol .  Doing pretty well in PT today .  If he/she  cannot view the results on line than contact us to get a written report .

## 2016-08-27 NOTE — Therapy (Signed)
[facility] [address] [place], [place], [address] Phone: [phone]   Fax:  [phone]  Physical Therapy Treatment  Patient Details  Name: [name] MRN: [id number] Date of Birth: [birth year] Referring Provider: Kathyrn Sheriff  Encounter Date: 08/27/2016      PT End of Session - 08/27/16 1201    Visit Number 5   Number of Visits 17   Date for PT Re-Evaluation 10/02/16   Authorization Type [employer] Medicare- G-code every 10 visits   PT Start Time 0930   PT Stop Time 1020   PT Time Calculation (min) 50 min   Equipment Utilized During Treatment Gait belt   Activity Tolerance Patient tolerated treatment well   Behavior During Therapy [facility] for tasks assessed/performed      Past Medical History:  Diagnosis Date  . Anemia   . Anxiety   . Basal cell carcinoma of skin    "cut and burned off; nothing in years" (08/20/2016)  . Depression   . Emphysema of lung ([place]) dx'd 08/19/2016  . GERD (gastroesophageal reflux disease)    WOULD USE OTC MEDICATION IF NEEDED  . Headache    "probably monthly" (08/20/2016)  . Hypothyroidism   . Lung mass    "bx scheduled in am" (08/20/2016)  . 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 Pneumonia ~ 2010  . Rosacea   . Stroke ([place]) 08/19/2016   "right arm weaker speech issues; right face droops" (08/20/2016)  . Tachycardia    PT STATES HER HEART RATE USUALLY ABOVE 100  . TIA (transient ischemic attack)    "hx several small strokes in the past year" (08/20/2016)    Past Surgical History:  Procedure Laterality Date  . BACK SURGERY    . BASAL CELL CARCINOMA EXCISION     "arms; forehead"  . BREAST BIOPSY Left    benign  . BUNIONECTOMY  2003   bilateral  . IR GENERIC HISTORICAL  06/05/2016   IR ANGIO VERTEBRAL SEL VERTEBRAL BILAT MOD SED 06/05/2016 Consuella Lose, MD [facility]  . IR GENERIC HISTORICAL  06/05/2016   IR ANGIO INTRA EXTRACRAN SEL INTERNAL CAROTID  BILAT MOD SED 06/05/2016 Consuella Lose, MD [facility]  . IR GENERIC HISTORICAL  07/03/2016   IR ANGIOGRAM FOLLOW UP STUDY 07/03/2016 Consuella Lose, MD [facility]  . IR GENERIC HISTORICAL  07/03/2016   IR TRANSCATH/EMBOLIZ 07/03/2016 Consuella Lose, MD [facility]  . LUMBAR LAMINECTOMY/DECOMPRESSION MICRODISCECTOMY Left 03/17/2013   Procedure: LUMBAR LAMINECTOMY/DECOMPRESSION IF RECESS STENOSIS FORAMINOTOMIE OF L4 ROOT AND L5,MICRODISECTOMY  L4-5 ON LEFT;  Surgeon: Tobi Bastos, MD;  Location: [facility];  Service: Orthopedics;  Laterality: Left;  . RADIOLOGY WITH ANESTHESIA N/A 07/03/2016   Procedure: arteriogram, pipeline embolization of aneurysm;  Surgeon: Consuella Lose, MD;  Location: [facility];  Service: Radiology;  Laterality: N/A;  . TONSILLECTOMY  1953  . TUBAL LIGATION      There were no vitals filed for this visit.      Subjective Assessment - 08/27/16 0936    Subjective She was hospitalized 12/27-12/29 with right abdominal pain & new weakness right side. She was diagnosed with acute CVA (husband reports mini-strokes) and right lung mass of primary lung carcinoma of 6.9 x 7.2 x 6.6 cm. and Metastatic involvement of T7 and T6 vertebral body with with some involvement of posterior elements at these levels.  Husband reports 2 falls when she was bending forward & squatting. She has bruising on left  shin otherwise no injuries.    Patient is accompained by: Family member   Pertinent History spouse- Bill   Limitations Walking;Standing;House hold activities   Patient Stated Goals To improve balance & walking.    Currently in Pain? Yes   Pain Score 4    Pain Location Abdomen   Pain Orientation Right;Lower   Pain Descriptors / Indicators Sharp   Pain Type Chronic pain   Pain Onset More than a month ago   Pain Frequency Intermittent   Pain Relieving Factors meds, raising pelvis up            [facility] PT Assessment - 08/27/16 0930      Ambulation/Gait   Gait  velocity 3.28 ft/sec comfortable & 3.60 ft/sec fast  Initial was 2.91 ft/sec     Berg Balance Test   Sit to Stand Able to stand without using hands and stabilize independently   Standing Unsupported Able to stand safely 2 minutes   Sitting with Back Unsupported but Feet Supported on Floor or Stool Able to sit safely and securely 2 minutes   Stand to Sit Sits safely with minimal use of hands   Transfers Able to transfer safely, definite need of hands   Standing Unsupported with Eyes Closed Able to stand 10 seconds safely   Standing Ubsupported with Feet Together Able to place feet together independently and stand 1 minute safely   From Standing, Reach Forward with Outstretched Arm Can reach forward >12 cm safely (5")   From Standing Position, Pick up Object from Floor Able to pick up shoe safely and easily   From Standing Position, Turn to Look Behind Over each Shoulder Looks behind from both sides and weight shifts well   Turn 360 Degrees Able to turn 360 degrees safely one side only in 4 seconds or less   Standing Unsupported, Alternately Place Feet on Step/Stool Able to complete >2 steps/needs minimal assist   Standing Unsupported, One Foot in Front Able to take small step independently and hold 30 seconds   Standing on One Leg Tries to lift leg/unable to hold 3 seconds but remains standing independently   Total Score 45   Berg comment: Initial was 47/56     Timed Up and Go Test   Normal TUG (seconds) 10.98  Initial was 12.2sec   Manual TUG (seconds) 11.88  Initial was 15sec    Cognitive TUG (seconds) 16.89  Initial was 17.5sec     Functional Gait  Assessment   Gait assessed  Yes   Gait Level Surface Walks 20 ft in less than 7 sec but greater than 5.5 sec, uses assistive device, slower speed, mild gait deviations, or deviates 6-10 in outside of the 12 in walkway width.   Change in Gait Speed Makes only minor adjustments to walking speed, or accomplishes a change in speed with  significant gait deviations, deviates 10-15 in outside the 12 in walkway width, or changes speed but loses balance but is able to recover and continue walking.   Gait with Horizontal Head Turns Performs head turns with moderate changes in gait velocity, slows down, deviates 10-15 in outside 12 in walkway width but recovers, can continue to walk.   Gait with Vertical Head Turns Performs task with moderate change in gait velocity, slows down, deviates 10-15 in outside 12 in walkway width but recovers, can continue to walk.   Gait and Pivot Turn Turns slowly, requires verbal cueing, or requires several small steps to catch balance following turn  and stop   Step Over Obstacle Is able to step over one shoe box (4.5 in total height) but must slow down and adjust steps to clear box safely. May require verbal cueing.   Gait with Narrow Base of Support Ambulates less than 4 steps heel to toe or cannot perform without assistance.   Gait with Eyes Closed Walks 20 ft, slow speed, abnormal gait pattern, evidence for imbalance, deviates 10-15 in outside 12 in walkway width. Requires more than 9 sec to ambulate 20 ft.   Ambulating Backwards Walks 20 ft, slow speed, abnormal gait pattern, evidence for imbalance, deviates 10-15 in outside 12 in walkway width.   Steps Alternating feet, must use rail.   Total Score 11                             PT Education - 08/27/16 1015    Education provided Yes   Education Details Fall risk, prevention strategies & concerns with compression fx with Thoracic METs if high force in fall.    Person(s) Educated Patient;Spouse   Methods Explanation;Verbal cues   Comprehension Verbalized understanding;Need further instruction          PT Short Term Goals - 08/27/16 1202      PT SHORT TERM GOAL #1   Title Patient to demonstrate decreased fall risk with Berg score 50/56. Target Date 08/28/16   Baseline NOT MET 08/27/2016  Pt had acute CVA 08/19/17.  Berg  Balance today 45/56   Time 4   Period Weeks   Status Not Met     PT SHORT TERM GOAL #2   Title Patient to ambulate with LRAD supervision x 300' level indoor tile/carpet.   Baseline NOT MET 08/27/2016 Pt requires min gaurd for balance recovery.    Time 4   Period Weeks   Status Not Met     PT SHORT TERM GOAL #3   Title Patient to perform HEP for balance and LE strength with supervision.   Time 4   Period Weeks   Status On-going     PT SHORT TERM GOAL #4   Title Patient to negotiate stairs with S with rail use 100% of the time.    Baseline MET 08/27/2016  Pt negotiate stairs with single rail with supervision.    Time 4   Period Weeks   Status Achieved           PT Long Term Goals - 08/27/16 1204      PT LONG TERM GOAL #1   Title Patient will demonstrate decreased fall risk with Berg score 52/56 (Target Date 10/02/2016)   Time 8   Period Weeks   Status On-going     PT LONG TERM GOAL #2   Title Patient will ambulate level indoor surfaces 300' with no balance losses with supervision for cognitive deficits. (Target Date 10/02/2016)   Time 8   Period Weeks   Status Revised     PT LONG TERM GOAL #3   Title Patient will demonstrate decreased fall risk with TUG cognitive in 15 seconds or less. (Target Date 10/02/2016)   Time 8   Period Weeks   Status Revised     PT LONG TERM GOAL #4   Title Patient & husband will demonstrate fall recovery & verbalize safe techniques for injury prevention. (Target Date 10/02/2016)   Time 8   Period Weeks   Status Revised     PT LONG  TERM GOAL #5   Title Patient will negotiate 4 stairs with single rail for safe entrance to her home with no balance issues & supervision for cognitive issues. (Target Date 10/02/2016)   Time 8   Period Weeks   Status Revised     Additional Long Term Goals   Additional Long Term Goals Yes     PT LONG TERM GOAL #6   Title Functional Gait Assessment >/= 19/30 to indicate lower fall risk. (Target Date 10/02/2016)    Time 1   Period Months   Status New               Plan - 08/27/16 1209    Clinical Impression Statement Patient had medical change since last PT visit with diagnosis of Lung cancer with METS to thoracic spine and acute CVA. Husband reports her balance improved with PT then small reqression with recent medical issues. Her Berg Balance declined from 47/56 to 45/56. Her TUG times all improved but still has fall risk with cognitive TUG. Her functional gait assessment 11/30 indicates high fall risk.    Rehab Potential Good   PT Frequency 2x / week   PT Duration 8 weeks   PT Treatment/Interventions Therapeutic activities;Moist Heat;Therapeutic exercise;Balance training;Neuromuscular re-education;Cognitive remediation;Gait training;Stair training;Functional mobility training;Patient/family education;Vestibular;ADLs/Self Care Home Management   PT Next Visit Plan continue gait, core strength and balance   Consulted and Agree with Plan of Care Patient;Family member/caregiver   Family Member Consulted spouse      Patient will benefit from skilled therapeutic intervention in order to improve the following deficits and impairments:  Abnormal gait, Decreased safety awareness, Pain, Decreased knowledge of precautions, Decreased activity tolerance, Decreased balance, Decreased cognition, Decreased mobility, Decreased strength, Postural dysfunction, Decreased knowledge of use of DME  Visit Diagnosis: Other lack of coordination  Muscle weakness (generalized)  Other abnormalities of gait and mobility  Other symptoms and signs involving cognitive functions following other cerebrovascular disease     Problem List Patient Active Problem List   Diagnosis Date Noted  . TIA (transient ischemic attack)   . Lung mass   . CVA (cerebral vascular accident) ([place]) 08/19/2016  . Stroke (cerebrum) ([place]) 08/19/2016  . Cerebral aneurysm, nonruptured 07/03/2016  . Right lower quadrant abdominal pain  05/20/2016  . Cervical spine disease 05/20/2016  . Frequent falls 05/05/2016  . Palpable abdominal aorta 10/11/2015  . Orthostatic hypotension 10/11/2015  . Hx of falling 08/21/2015  . Aneurysm, cerebral, nonruptured 02/12/2015  . Transient confusion 02/12/2015  . Altered mental status 06/05/2014  . New onset headache 06/05/2014  . Essential hypertension 06/05/2014  . Aneurysm ([place]) 06/05/2014  . Unsteady gait 04/30/2014  . Underweight 03/06/2014  . Borderline anemia 03/06/2014  . Other malaise and fatigue 01/01/2014  . Loss of weight 01/01/2014  . Stress 01/01/2014  . Medication management 10/06/2013  . History of back surgery 08/02/2013  . Radicular pain of left lower extremity 08/02/2013  . Elevated blood pressure reading 08/02/2013  . Encounter for Medicare annual wellness exam 08/02/2013  . Adjustment reaction with anxiety and depression 08/02/2013  . Hypothyroidism 08/02/2013  . Spinal stenosis, lumbar region, with neurogenic claudication 03/17/2013  . Herniated lumbar intervertebral disc 03/17/2013  . Wax in ear 07/19/2011  . Decreased hearing 07/19/2011  . TOBACCO USE, QUIT 06/24/2009  . ELEVATED BLOOD PRESSURE WITHOUT DIAGNOSIS OF HYPERTENSION 04/11/2009  . RHINITIS, CHRONIC 06/25/2008  . ACTINIC KERATOSIS, HEAD 06/25/2008  . HEARTBURN 09/12/2007  . LEG PAIN 06/27/2007  . OSTEOPOROSIS 06/27/2007  .  Disturbance in sleep behavior 06/27/2007  . NUMBNESS 06/27/2007  . Edema 06/27/2007  . FASTING HYPERGLYCEMIA 06/27/2007  . HYPOTHYROIDISM 04/21/2007  . ROSACEA 04/21/2007  . Osteoarthritis 04/21/2007    Meer Reindl PT, DPT 08/27/2016, 12:14 PM  [facility] [address] [place], [place], [address] Phone: [phone]   Fax:  [phone]  Name: [name] MRN: [id number] Date of Birth: 01-12-1946

## 2016-08-27 NOTE — Progress Notes (Signed)
Oncology Nurse Navigator Documentation  Oncology Nurse Navigator Flowsheets 08/27/2016  Navigator Location [facility]  Navigator Encounter Type Other/per thoracic cancer conference discussion, Dr. Julien Nordmann and Dr. Sondra Come would like patient to have an urgent referral to rad onc.  I notified Rad Onc scheduling of urgent referral.  I also notified [facility] to send molecular test to [facility] and to get PDL 1.    Abnormal Finding Date 08/19/2016  Confirmed Diagnosis Date 08/21/2016  Treatment Phase Pre-Tx/Tx Discussion  Barriers/Navigation Needs Coordination of Care  Interventions Coordination of Care  Coordination of Care Appts;Other  Acuity Level 2  Acuity Level 2 Assistance expediting appointments;Other  Time Spent with Patient 45

## 2016-08-27 NOTE — Telephone Encounter (Signed)
Pts husband calling to check the status of the results from the biopsy.

## 2016-08-27 NOTE — Patient Instructions (Signed)

## 2016-08-27 NOTE — Therapy (Signed)
[facility] [address] [place], [place], [address] Phone: [phone]   Fax:  [phone]  Occupational Therapy Treatment & Re-Assessment  Patient Details  Name: [name] MRN: [id number] Date of Birth: 01-Nov-1945 Referring Provider: Dr. Consuella Lose  Encounter Date: 08/27/2016      OT End of Session - 08/27/16 1139    Visit Number 5   Number of Visits 16   Date for OT Re-Evaluation 09/24/16  Re-assessment performed 08/27/16   Authorization Type [place] [employer] - G code every 10th visit   Authorization - Visit Number 5   Authorization - Number of Visits 10   OT Start Time 0845   OT Stop Time 0932   OT Time Calculation (min) 47 min   Activity Tolerance Patient tolerated treatment well   Behavior During Therapy [facility] for tasks assessed/performed;Impulsive      Past Medical History:  Diagnosis Date  . Anemia   . Anxiety   . Basal cell carcinoma of skin    "cut and burned off; nothing in years" (08/20/2016)  . Depression   . Emphysema of lung ([place]) dx'd 08/19/2016  . GERD (gastroesophageal reflux disease)    WOULD USE OTC MEDICATION IF NEEDED  . Headache    "probably monthly" (08/20/2016)  . Hypothyroidism   . Lung mass    "bx scheduled in am" (08/20/2016)  . 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 Pneumonia ~ 2010  . Rosacea   . Stroke ([place]) 08/19/2016   "right arm weaker speech issues; right face droops" (08/20/2016)  . Tachycardia    PT STATES HER HEART RATE USUALLY ABOVE 100  . TIA (transient ischemic attack)    "hx several small strokes in the past year" (08/20/2016)    Past Surgical History:  Procedure Laterality Date  . BACK SURGERY    . BASAL CELL CARCINOMA EXCISION     "arms; forehead"  . BREAST BIOPSY Left    benign  . BUNIONECTOMY  2003   bilateral  . IR GENERIC HISTORICAL  06/05/2016   IR ANGIO VERTEBRAL SEL VERTEBRAL BILAT MOD SED  06/05/2016 Consuella Lose, MD [facility]  . IR GENERIC HISTORICAL  06/05/2016   IR ANGIO INTRA EXTRACRAN SEL INTERNAL CAROTID BILAT MOD SED 06/05/2016 Consuella Lose, MD [facility]  . IR GENERIC HISTORICAL  07/03/2016   IR ANGIOGRAM FOLLOW UP STUDY 07/03/2016 Consuella Lose, MD [facility]  . IR GENERIC HISTORICAL  07/03/2016   IR TRANSCATH/EMBOLIZ 07/03/2016 Consuella Lose, MD [facility]  . LUMBAR LAMINECTOMY/DECOMPRESSION MICRODISCECTOMY Left 03/17/2013   Procedure: LUMBAR LAMINECTOMY/DECOMPRESSION IF RECESS STENOSIS FORAMINOTOMIE OF L4 ROOT AND L5,MICRODISECTOMY  L4-5 ON LEFT;  Surgeon: Tobi Bastos, MD;  Location: [facility];  Service: Orthopedics;  Laterality: Left;  . RADIOLOGY WITH ANESTHESIA N/A 07/03/2016   Procedure: arteriogram, pipeline embolization of aneurysm;  Surgeon: Consuella Lose, MD;  Location: [facility];  Service: Radiology;  Laterality: N/A;  . TONSILLECTOMY  1953  . TUBAL LIGATION      There were no vitals filed for this visit.      Subjective Assessment - 08/27/16 0844    Subjective  Pt reports that she has recently been admitted and discharged from acute care. Per chart review, pt had CVA, cont to demonstrate deficits with cognition/awareness of deficits, R UE weakness, incoordination and decreased balance. Pt reports "I had 2 falls yesterday" at home.    Patient is accompained by: Family member  Pertinent History See EPIC for full history; Currently "with another aneurysm that is growing & needs to be removed" surgically.; No driving, 19-50 falls in last 6 months   Currently in Pain? No/denies   Pain Score 0-No pain            [facility] OT Assessment - 08/27/16 0001      ADL   ADL comments Pt gets assistance for ADL's PRN, she is independent with basic ADL's.  She states that she stands for ADL's and sits on a chair PRN to rest.      IADL   Prior Level of Function Light Housekeeping Pt husband and daughters do most light house keeping    Meal Prep Able to complete simple cold meal and snack prep  Pt's husband prepares more complex meals for pt;      Mobility   Mobility Status History of falls   Mobility Status Comments Pt reports that she fell x2 yesterday and her husband assisted her after falling     Written Expression   Dominant Hand Right   Handwriting Increased time;Mild micrographia     Vision - History   Additional Comments Pt denies changes to her vision. Will cont to assess in functional context.     Activity Tolerance   Activity Tolerance Comments Pt reports that she is more tired than usual. She takes frequent naps and rest breaks.     Cognition   Overall Cognitive Status Impaired/Different from baseline  Difficulty word finding, h/o cog impairement prior   Memory Impaired   Memory Impairment Storage deficit;Retrieval deficit;Decreased recall of new information;Decreased short term memory   Decreased Short Term Memory Verbal basic;Functional basic   Awareness Impaired   Awareness Impairment --  Decreased awareness and decreased awareness of deficits   Problem Solving Impaired   Executive Function Decision Making;Self Correcting     Sensation   Light Touch Appears Intact  Pt c/o ocassional paresthesias but not consistent     Coordination   Box and Blocks R = 21   Other C/o difficulty with handwriting ("too tiny"), doing her hair.    Coordination Impaired  Difficulty following directions.      ROM / Strength   AROM / PROM / Strength AROM;Strength     AROM   Overall AROM  Within functional limits for tasks performed     Strength   Overall Strength Deficits   Overall Strength Comments RUE is "weak"; gross MMT = 2+-3-/5 throughout. Pt is very frail. LUE = 3/5 overall                   OT Treatments/Exercises (OP) - 08/27/16 0001      ADLs   ADL Comments Pt reports that she is Mod I for basic ADL's - she takes rest breaks as needed during ADL's. Her husband assists with meal  prep, homemaking and higher level ADL's PRN. Discussed (w/ pt/spouse) using right hand for functional activity and "therapy" at home ie: doing hair, brushing teeth, painting nails, doing her hair etc. to assist with improving FM/coord and functionla use R dominant hand. Both pt/spouse verbalized understanding in clinic today.   ADL Education Given Yes     Cognitive Exercises   Handwriting Basic handwriting - signing name using various size grip, weighted etc. Pt is noted to to have micro writing and fatigues easily. Issued tan cylindrical foam to place on pen at home. Discussed handwriting tasks - making loops, circles, tracing alphabet etc as  homework and practice tasks. Pt/spouse verbalized understanding.   Other Cognitive Exercises 1 Reviewed and re-issued Memory strategies as previously issued per pt request.   Other Cognitive Exercises 2 Reviewed previous home program for handwriting, putty ex's, ADL's and functional use of RUE for coordination. Pt denies need for copy "We have that at home". Pt spouse was also instructed in recommendations for home program.     Fine Motor Coordination   Other Fine Motor Exercises Pt with difficulty following directions and completing exercises. Requires repetition and frequent vc's.   Other Fine Motor Exercises Yellow putty to grip, pinch, roll, remove coins that were buried x10 reps each.                  OT Education - 08/27/16 1138    Education provided Yes   Education Details Memory strategies, home program as issued previously was reviewed with pt/spouse   Person(s) Educated Patient;Spouse   Methods Explanation;Demonstration  Handout for memory strategies only  per pt request   Comprehension Verbalized understanding;Verbal cues required          OT Short Term Goals - 08/27/16 1154      OT SHORT TERM GOAL #1   Title Pt wil lbe Min A home program for memory strategies   Time 3   Period Weeks   Status On-going     OT SHORT TERM GOAL  #2   Title Pt will be Mod I home program for FM/coordination ex's bilateral UE's    Time 3   Period Weeks   Status On-going     OT SHORT TERM GOAL #3   Title Pt will be Min vc's for fall prevention techniques during ADL's as seen by ability to state 2-3 techniques/strategies.   Time 3   Period Weeks   Status On-going     OT SHORT TERM GOAL #4   Title Pt will be Mod I w/ 2-3 DME, and a/e options to assist with increased ADL independence   Time 3   Period Weeks   Status On-going           OT Long Term Goals - 08/27/16 1154      OT LONG TERM GOAL #1   Title Pt will I'ly state 2-3 memory strategies to assist with safety    Time 6   Period Weeks   Status On-going     OT LONG TERM GOAL #2   Title Pt will be supervision with simulated tub transfers using DME & A/e PRN   Time 6   Period Weeks   Status On-going     OT LONG TERM GOAL #3   Title Pt will be supervision level for simple snack prep in ADL kitchen using necessary DME and maintinaing safety   Time 6   Period Weeks   Status On-going     OT LONG TERM GOAL #4   Title Pt will demonstrate increased strength as seen by RUE gross MMT of 3/5 throughout   Time 6   Period Weeks   Status On-going     OT LONG TERM GOAL #5   Title Pt will demonstrate improved FM/coordination as seen by improved 9 hole peg test score by 25 seconds or more on the right and 10 seconds or more on the left.   Baseline 07/29/16 = R = 112.32 sec, L = 55.19 sec   Time 6   Period Weeks   Status On-going  Plan - 08/27/16 1141    Clinical Impression Statement Discussed findings from re-assessment and recent hospitialization for new CVA, & recent dx lung cancer and mets to her back. Areas of concern cont to be cognition, coordination, endurance & strength. Pt reports that she is not home alone at this time when her husband goes to work "One of my daughters stays with me". Pt also report 2 falls yesterday at home (08/26/16). She  should benefit from out-pt OT for generalized strengthening, endurance, coordination, functional ADL's as planned during intial OT assessment on 07/29/16. Pt is being assessed by SLP for speech concerns and cognition today (08/27/16).   Rehab Potential Fair   Clinical Impairments Affecting Rehab Potential H/o 20-30 falls in last 6 months; Unknown/Unclear baseline cognitive level; has some DME, does not use; stays home alone while husband works in evenings (08/11/16). Pt reports that she is not alone in evenings at this time (daughter stays with her). New dx of cerebellar CVA & lung cancer w/ mets to her back. (Re-assessment on 08/27/16)   OT Frequency 2x / week   OT Duration 6 weeks   OT Treatment/Interventions Self-care/ADL training;DME and/or AE instruction;Patient/family education;Balance training;Therapeutic exercises;Therapeutic exercise;Therapeutic activities;Cognitive remediation/compensation;Functional Mobility Training;Neuromuscular education;Energy conservation   Plan Simple snack prep/functional activity with emphasis on following directions, attending to task and functional use of RUE. Energy conservation tech's.   Consulted and Agree with Plan of Care Patient;Family member/caregiver   Family Member Consulted Husband      Patient will benefit from skilled therapeutic intervention in order to improve the following deficits and impairments:  Decreased coordination, Impaired sensation, Difficulty walking, Decreased safety awareness, Decreased endurance, Decreased activity tolerance, Decreased knowledge of precautions, Pain, Impaired UE functional use, Decreased knowledge of use of DME, Decreased balance, Decreased cognition, Decreased mobility, Decreased strength, Impaired perceived functional ability  Visit Diagnosis: Other symptoms and signs involving cognitive functions following other cerebrovascular disease - Plan: Ot plan of care cert/re-cert  Other lack of coordination - Plan: Ot plan of  care cert/re-cert  Muscle weakness (generalized) - Plan: Ot plan of care cert/re-cert  Repeated falls - Plan: Ot plan of care cert/re-cert      G-Codes - 98/33/82 1155    Functional Assessment Tool Used Clinical judgement; Box & Blocks (R = 21 08/27/16)   Functional Limitation Carrying, moving and handling objects   Carrying, Moving and Handling Objects Current Status (N0539) At least 60 percent but less than 80 percent impaired, limited or restricted   Carrying, Moving and Handling Objects Goal Status (J6734) At least 20 percent but less than 40 percent impaired, limited or restricted      Problem List Patient Active Problem List   Diagnosis Date Noted  . TIA (transient ischemic attack)   . Lung mass   . CVA (cerebral vascular accident) ([place]) 08/19/2016  . Stroke (cerebrum) ([place]) 08/19/2016  . Cerebral aneurysm, nonruptured 07/03/2016  . Right lower quadrant abdominal pain 05/20/2016  . Cervical spine disease 05/20/2016  . Frequent falls 05/05/2016  . Palpable abdominal aorta 10/11/2015  . Orthostatic hypotension 10/11/2015  . Hx of falling 08/21/2015  . Aneurysm, cerebral, nonruptured 02/12/2015  . Transient confusion 02/12/2015  . Altered mental status 06/05/2014  . New onset headache 06/05/2014  . Essential hypertension 06/05/2014  . Aneurysm ([place]) 06/05/2014  . Unsteady gait 04/30/2014  . Underweight 03/06/2014  . Borderline anemia 03/06/2014  . Other malaise and fatigue 01/01/2014  . Loss of weight 01/01/2014  . Stress 01/01/2014  . Medication  management 10/06/2013  . History of back surgery 08/02/2013  . Radicular pain of left lower extremity 08/02/2013  . Elevated blood pressure reading 08/02/2013  . Encounter for Medicare annual wellness exam 08/02/2013  . Adjustment reaction with anxiety and depression 08/02/2013  . Hypothyroidism 08/02/2013  . Spinal stenosis, lumbar region, with neurogenic claudication 03/17/2013  . Herniated lumbar intervertebral disc  03/17/2013  . Wax in ear 07/19/2011  . Decreased hearing 07/19/2011  . TOBACCO USE, QUIT 06/24/2009  . ELEVATED BLOOD PRESSURE WITHOUT DIAGNOSIS OF HYPERTENSION 04/11/2009  . RHINITIS, CHRONIC 06/25/2008  . ACTINIC KERATOSIS, HEAD 06/25/2008  . HEARTBURN 09/12/2007  . LEG PAIN 06/27/2007  . OSTEOPOROSIS 06/27/2007  . Disturbance in sleep behavior 06/27/2007  . NUMBNESS 06/27/2007  . Edema 06/27/2007  . FASTING HYPERGLYCEMIA 06/27/2007  . HYPOTHYROIDISM 04/21/2007  . ROSACEA 04/21/2007  . Osteoarthritis 04/21/2007    Almyra Deforest, OTR/L 08/27/2016, 12:02 PM  [facility] [address] [place], [place], [address] Phone: [phone]   Fax:  [phone]  Name: JET TRAYNHAM MRN: [id number] Date of Birth: [birth year]

## 2016-08-27 NOTE — Telephone Encounter (Signed)
Spoke to patient's daughter who was very concerned about mother's biopsy results. Noted only husband was on the [facility]. Made daughter aware I would follow up with concerns and contact the patient's husband (patient currently at rehab appointment).   Spoke with Hinton Dyer, Engineer, site from [place]. 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 Julien Nordmann will be assigned care of patient as her Medical Oncologist -Patient has appointment with Dr. Julien Nordmann  -Appointment with Dr. Julien Nordmann is for Tuesday, January 9th at 2:30pm. Patient's husband is aware to arrive at 1:45pm for non-fasting labs prior to appointment.   Spoke with Dr. Regis Bill about the above information. She is aware of details and plans to call the patient to disclose findings of pathology report. Patient and family currently are not aware of cancer findings.   Patient's husband is aware of steps. No questions at the time.

## 2016-08-27 NOTE — Telephone Encounter (Signed)
Pts husband called with the information [facility]  [phone] (spoke w/Wanda) stated that they faxed the information to Dr. Regis Bill on 08/26/16 and pt and husband is very anxious to see what is going on.

## 2016-08-27 NOTE — Telephone Encounter (Signed)
Appt scheduled w/Mohamed on 1/9 at 345pm. No answer when I cld the pt. Will mail a letter w/the appt date and time.

## 2016-08-27 NOTE — Patient Instructions (Addendum)
Play simple card games Category naming tasks

## 2016-08-27 NOTE — Therapy (Signed)
[facility] [address] [place], [place], [address] Phone: [phone]   Fax:  [phone]  Speech Language Pathology Evaluation  Patient Details  Name: [name] MRN: [id number] Date of Birth: 10-Jan-1946 Referring Provider: Shanon Ace MD (PCP)  Encounter Date: 08/27/2016      End of Session - 08/27/16 1420    Visit Number 1   Number of Visits 17   Date for SLP Re-Evaluation 11/06/16   SLP Start Time 1107   SLP Stop Time  1149   SLP Time Calculation (min) 42 min   Activity Tolerance --  limited due to decr'd attention      Past Medical History:  Diagnosis Date  . Anemia   . Anxiety   . Basal cell carcinoma of skin    "cut and burned off; nothing in years" (08/20/2016)  . Depression   . Emphysema of lung ([place]) dx'd 08/19/2016  . GERD (gastroesophageal reflux disease)    WOULD USE OTC MEDICATION IF NEEDED  . Headache    "probably monthly" (08/20/2016)  . Hypothyroidism   . Lung mass    "bx scheduled in am" (08/20/2016)  . 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 Pneumonia ~ 2010  . Rosacea   . Stroke ([place]) 08/19/2016   "right arm weaker speech issues; right face droops" (08/20/2016)  . Tachycardia    PT STATES HER HEART RATE USUALLY ABOVE 100  . TIA (transient ischemic attack)    "hx several small strokes in the past year" (08/20/2016)    Past Surgical History:  Procedure Laterality Date  . BACK SURGERY    . BASAL CELL CARCINOMA EXCISION     "arms; forehead"  . BREAST BIOPSY Left    benign  . BUNIONECTOMY  2003   bilateral  . IR GENERIC HISTORICAL  06/05/2016   IR ANGIO VERTEBRAL SEL VERTEBRAL BILAT MOD SED 06/05/2016 Consuella Lose, MD [facility]  . IR GENERIC HISTORICAL  06/05/2016   IR ANGIO INTRA EXTRACRAN SEL INTERNAL CAROTID BILAT MOD SED 06/05/2016 Consuella Lose, MD [facility]  . IR GENERIC HISTORICAL  07/03/2016   IR ANGIOGRAM FOLLOW UP STUDY  07/03/2016 Consuella Lose, MD [facility]  . IR GENERIC HISTORICAL  07/03/2016   IR TRANSCATH/EMBOLIZ 07/03/2016 Consuella Lose, MD [facility]  . LUMBAR LAMINECTOMY/DECOMPRESSION MICRODISCECTOMY Left 03/17/2013   Procedure: LUMBAR LAMINECTOMY/DECOMPRESSION IF RECESS STENOSIS FORAMINOTOMIE OF L4 ROOT AND L5,MICRODISECTOMY  L4-5 ON LEFT;  Surgeon: Tobi Bastos, MD;  Location: [facility];  Service: Orthopedics;  Laterality: Left;  . RADIOLOGY WITH ANESTHESIA N/A 07/03/2016   Procedure: arteriogram, pipeline embolization of aneurysm;  Surgeon: Consuella Lose, MD;  Location: [facility];  Service: Radiology;  Laterality: N/A;  . TONSILLECTOMY  1953  . TUBAL LIGATION      There were no vitals filed for this visit.      Subjective Assessment - 08/27/16 1327    Subjective "I had a stroke in the hospital after the surgery."             SLP Evaluation [facility] - 08/27/16 1117      SLP Visit Information   SLP Received On 08/27/16   Referring Provider Shanon Ace MD (PCP)   Onset Date November 2017   Medical Diagnosis CVA     Subjective   Patient/Family Stated Goal Increase speech accuracy      Pain Assessment   Currently in Pain? No/denies     General Information  HPI [name] an 71 y.o.femalewith history of nonruptured cerebral aneurysms S/Pendovascular treatment with placement of a pipeline stent in the left internal carotid artery on 07/23/2016 by Dr. Awanda Mink cerebral aneurysm on the right, a previous TIA / stroke, frequent falls, orthostatic static hypotension, hypertension, hypothyroidism, anxiety, depression, ongoing tobaccouse, hard of hearing, hyperglycemia, and hypothyroidismwho presented to the emergency department for evaluation of right upper quadrant pain.While here she developed right upper extremity weakness,facial droop, and dysarthria.Neurology was asked to evaluate. Last month, following her left carotid stent stent placementthe  patient developed right facial droop,right upperextremity weakness, and dysarthria. The patient was placed on aspirin and Plavix howevershe was intolerant to the Plavix and it was discontinued. She underwent outpatient therapiesand showed significant improvement. Since that time the patient has had difficulties with abdominal painand has been seen in emergency department on several occasions. She presented today for further evaluation and had an abdominal ultrasound as well asa chest CT with contrast to evaluate a possible nodule.The patient's blood pressurewas noted to be lowand she was given IV fluid hydration. She also received some morphine which may have contributed to her hypotension. Pt now diagnosed with CVA (cerebrum). 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   Mobility Status Pt ambulated to therapy session independently from lobby     Prior Functional Status   Cognitive/Linguistic Baseline Within functional limits   Type of Home House    Lives With Spouse   Available Support Family     Pain Assessment   Pain Assessment No/denies pain     Cognition   Overall Cognitive Status Impaired/Different from baseline   Area of Impairment Attention;Problem solving;Memory;Safety/judgement;Awareness   Current Attention Level Sustained   Attention Comments Difficulty with trail making, letter ID in a letter string, and follow mod complex directions   Memory Decreased short-term memory   Memory Comments Pt did not recall being told of observer 20-25 minutes after entry from lobby   Safety/Judgement Decreased awareness of deficits   Safety and Judgement Comments pt could not indpendently ID full range of deficits/cognitive deficits   Awareness Intellectual   Awareness Comments Upon questioning, pt agreed with cognitive-linguistic deficits   Problem Solving Requires verbal cues;Difficulty sequencing;Slow processing   Problem Solving Comments Pt's decr'd sustained  attention resulted in inability to problem solve clock drawing and trail making tasks; pt had difficulty verbalizing sequence of events during hospitalization     Auditory Comprehension   Overall Auditory Comprehension Appears within functional limits for tasks assessed     Verbal Expression   Overall Verbal Expression --  pt remarked she was unable to find words     Written Expression   Dominant Hand Right   Written Expression Not tested     Oral Motor/Sensory Function   Overall Oral Motor/Sensory Function Not tested due to time constraints     Motor Speech   Overall Motor Speech --  Pt reports speech is "slurry", however pt 100% intelligible   Respiration Impaired   Level of Impairment Phrase   Phonation Impaired   Volume Soft                         SLP Education - 08/27/16 1416    Education provided Yes   Education Details Proposed ST course of therapy, deficits in cognitive-linguistics, home tasks to perform for pt to improve sustained/selective attention   Person(s) Educated Patient;Spouse   Methods Explanation;Demonstration;Handout  Comprehension Verbalized understanding;Need further instruction  pt requires further instruction          SLP Short Term Goals - 09/09/16 1435      SLP SHORT TERM GOAL #1   Title pt will demo sustained attention in quiet room to complete simple cognitive linguistic tasks for 3 minutes with no more than 3 redirections to task   Time 4   Period Weeks   Status On-going     SLP SHORT TERM GOAL #2   Title pt will tell SLP 3 cognitive linguistic deficits with memory compensations allowed, over two sessions   Time 4   Period Weeks   Status New          SLP Long Term Goals - 2016/09/09 1437      SLP LONG TERM GOAL #1   Title pt will demo selective attention in a min noisy environment for 8 minutes with no more than 5 redirections back to a simple written/auditory task   Time 8  or 16 visits   Period Weeks    Status New     SLP LONG TERM GOAL #2   Title pt will demo ability to functionally sequence a simple, functional cognitive linguistic task    Time 8   Period Weeks   Status New          Plan - 2016/09/09 1429    Clinical Impression Statement Pt presetns with significant attention deficits from a CVA, which impact other cognitive linguistic areas such as awareness, problem solving, organization and sequencing. Skilled ST would benefit pt to incr her independence.    Speech Therapy Frequency 2x / week   Duration --  8 weeks, or 16 visits   Treatment/Interventions Cognitive reorganization;Compensatory techniques;Internal/external aids;SLP instruction and feedback;Functional tasks;Cueing hierarchy;Patient/family education;Oral motor exercises  any or all may be used during treatment sessions   Potential to Achieve Goals Good   Potential Considerations Ability to learn/carryover information   Consulted and Agree with Plan of Care Patient      Patient will benefit from skilled therapeutic intervention in order to improve the following deficits and impairments:   Cognitive communication deficit  Dysarthria and anarthria      G-Codes - 09-09-2016 1446    Functional Assessment Tool Used NOMS - 3 (75% impaired)   Functional Limitations Attention   Attention Current Status (B2841) At least 60 percent but less than 80 percent impaired, limited or restricted   Attention Goal Status (L2440) At least 40 percent but less than 60 percent impaired, limited or restricted      Problem List Patient Active Problem List   Diagnosis Date Noted  . TIA (transient ischemic attack)   . Lung mass   . CVA (cerebral vascular accident) ([place]) 08/19/2016  . Stroke (cerebrum) ([place]) 08/19/2016  . Cerebral aneurysm, nonruptured 07/03/2016  . Right lower quadrant abdominal pain 05/20/2016  . Cervical spine disease 05/20/2016  . Frequent falls 05/05/2016  . Palpable abdominal aorta 10/11/2015  . Orthostatic  hypotension 10/11/2015  . Hx of falling 08/21/2015  . Aneurysm, cerebral, nonruptured 02/12/2015  . Transient confusion 02/12/2015  . Altered mental status 06/05/2014  . New onset headache 06/05/2014  . Essential hypertension 06/05/2014  . Aneurysm ([place]) 06/05/2014  . Unsteady gait 04/30/2014  . Underweight 03/06/2014  . Borderline anemia 03/06/2014  . Other malaise and fatigue 01/01/2014  . Loss of weight 01/01/2014  . Stress 01/01/2014  . Medication management 10/06/2013  . History of back surgery 08/02/2013  .  Radicular pain of left lower extremity 08/02/2013  . Elevated blood pressure reading 08/02/2013  . Encounter for Medicare annual wellness exam 08/02/2013  . Adjustment reaction with anxiety and depression 08/02/2013  . Hypothyroidism 08/02/2013  . Spinal stenosis, lumbar region, with neurogenic claudication 03/17/2013  . Herniated lumbar intervertebral disc 03/17/2013  . Wax in ear 07/19/2011  . Decreased hearing 07/19/2011  . TOBACCO USE, QUIT 06/24/2009  . ELEVATED BLOOD PRESSURE WITHOUT DIAGNOSIS OF HYPERTENSION 04/11/2009  . RHINITIS, CHRONIC 06/25/2008  . ACTINIC KERATOSIS, HEAD 06/25/2008  . HEARTBURN 09/12/2007  . LEG PAIN 06/27/2007  . OSTEOPOROSIS 06/27/2007  . Disturbance in sleep behavior 06/27/2007  . NUMBNESS 06/27/2007  . Edema 06/27/2007  . FASTING HYPERGLYCEMIA 06/27/2007  . HYPOTHYROIDISM 04/21/2007  . ROSACEA 04/21/2007  . Osteoarthritis 04/21/2007    [facility] ,MS, CCC-SLP  08/27/2016, 5:03 PM  [facility] [address] [place], [place], [address] Phone: [phone]   Fax:  [phone]  Name: ALEXANDER MCAULEY MRN: [id number] Date of Birth: 1946-03-26

## 2016-08-28 ENCOUNTER — Encounter: Payer: Self-pay | Admitting: Internal Medicine

## 2016-08-28 ENCOUNTER — Ambulatory Visit
Admission: RE | Admit: 2016-08-28 | Discharge: 2016-08-28 | Disposition: A | Discharge status: Home / Self Care | Payer: Medicare Other | Source: Ambulatory Visit | Attending: Radiation Oncology | Admitting: Radiation Oncology

## 2016-08-28 ENCOUNTER — Telehealth: Payer: Self-pay | Admitting: *Deleted

## 2016-08-28 ENCOUNTER — Encounter: Payer: Self-pay | Admitting: Radiation Oncology

## 2016-08-28 DIAGNOSIS — F17210 Nicotine dependence, cigarettes, uncomplicated: Secondary | ICD-10-CM | POA: Diagnosis not present

## 2016-08-28 DIAGNOSIS — Z79899 Other long term (current) drug therapy: Secondary | ICD-10-CM | POA: Insufficient documentation

## 2016-08-28 DIAGNOSIS — Z7982 Long term (current) use of aspirin: Secondary | ICD-10-CM | POA: Diagnosis not present

## 2016-08-28 DIAGNOSIS — J439 Emphysema, unspecified: Secondary | ICD-10-CM | POA: Diagnosis not present

## 2016-08-28 DIAGNOSIS — Z9889 Other specified postprocedural states: Secondary | ICD-10-CM | POA: Diagnosis not present

## 2016-08-28 DIAGNOSIS — F419 Anxiety disorder, unspecified: Secondary | ICD-10-CM | POA: Insufficient documentation

## 2016-08-28 DIAGNOSIS — C3431 Malignant neoplasm of lower lobe, right bronchus or lung: Secondary | ICD-10-CM | POA: Insufficient documentation

## 2016-08-28 DIAGNOSIS — M419 Scoliosis, unspecified: Secondary | ICD-10-CM | POA: Insufficient documentation

## 2016-08-28 DIAGNOSIS — Z510 Encounter for antineoplastic radiation therapy: Secondary | ICD-10-CM | POA: Insufficient documentation

## 2016-08-28 DIAGNOSIS — Z85828 Personal history of other malignant neoplasm of skin: Secondary | ICD-10-CM | POA: Diagnosis not present

## 2016-08-28 DIAGNOSIS — F329 Major depressive disorder, single episode, unspecified: Secondary | ICD-10-CM | POA: Diagnosis not present

## 2016-08-28 DIAGNOSIS — I2510 Atherosclerotic heart disease of native coronary artery without angina pectoris: Secondary | ICD-10-CM | POA: Insufficient documentation

## 2016-08-28 DIAGNOSIS — L719 Rosacea, unspecified: Secondary | ICD-10-CM | POA: Insufficient documentation

## 2016-08-28 DIAGNOSIS — R531 Weakness: Secondary | ICD-10-CM | POA: Insufficient documentation

## 2016-08-28 DIAGNOSIS — Z888 Allergy status to other drugs, medicaments and biological substances status: Secondary | ICD-10-CM | POA: Insufficient documentation

## 2016-08-28 DIAGNOSIS — C3490 Malignant neoplasm of unspecified part of unspecified bronchus or lung: Secondary | ICD-10-CM

## 2016-08-28 DIAGNOSIS — E039 Hypothyroidism, unspecified: Secondary | ICD-10-CM | POA: Diagnosis not present

## 2016-08-28 DIAGNOSIS — I671 Cerebral aneurysm, nonruptured: Secondary | ICD-10-CM | POA: Insufficient documentation

## 2016-08-28 DIAGNOSIS — Z8673 Personal history of transient ischemic attack (TIA), and cerebral infarction without residual deficits: Secondary | ICD-10-CM | POA: Diagnosis not present

## 2016-08-28 DIAGNOSIS — K219 Gastro-esophageal reflux disease without esophagitis: Secondary | ICD-10-CM | POA: Insufficient documentation

## 2016-08-28 NOTE — Progress Notes (Signed)
Radiation Oncology         [phone] ________________________________  Initial Outpatient Consultation  Name: [name] MRN: [id number]  Date: 08/28/2016  DOB: 05-Apr-1946  VH:QIONGE,XBMWU KOTVAN, MD  Panosh, Standley Brooking, MD   REFERRING PHYSICIAN: Regis Bill Standley Brooking, MD  DIAGNOSIS: The encounter diagnosis was Primary cancer of right lower lobe of lung ([place]).  HISTORY OF PRESENT ILLNESS::[name] is a 71 y.o. female who presented to the ER on 08/19/16 with right abdominal pain. A chest CT revealed a solid mass (6.9 x 7.2 x 6.6 cm) within the superior segment of the right lower lobe extending into the posterior inferior right upper lobe, as well as the mediastinum and adjacent paravertebral soft tissues consistent with primary lung carcinoma. There was also metastatic involvements of T7 and T6 vertebral body with some involvement of posterior elements at these levels. Biopsy of the right lower lobe on 08/21/16 revealed adenocarcinoma.  Patient has an appointment with Dr. Melvyn Novas on 09/15/16. She also has an appointment with Dr. Julien Nordmann on 09/01/16.  Patient notes a 10 lb weight loss in 1 week. She also reports a dry cough. She has pain under her right rib cage radiating in her lower back. She is taking Tramadol for this pain and is requesting a refill.She denies changes in appetite. She denies lower extremity numbness. She denies hemoptysis. Of note, patient had a stroke and now reports right arm weakness, and balance issues. Husband reports physical therapy has reduced difficulty walking. Husband reports ileus, for which she takes Miralax.   PREVIOUS RADIATION THERAPY: No  PAST MEDICAL HISTORY:  has a past medical history of Anemia; Anxiety; Basal cell carcinoma of skin; Depression; Emphysema of lung ([place]) (dx'd 08/19/2016); GERD (gastroesophageal reflux disease); Headache; Hypothyroidism; Lung mass; Osteoarthritis; Pneumonia (~ 2010); Rosacea; Stroke ([place]) (08/19/2016); Tachycardia; and TIA  (transient ischemic attack).    PAST SURGICAL HISTORY: Past Surgical History:  Procedure Laterality Date  . BACK SURGERY    . BASAL CELL CARCINOMA EXCISION     "arms; forehead"  . BREAST BIOPSY Left    benign  . BUNIONECTOMY  2003   bilateral  . IR GENERIC HISTORICAL  06/05/2016   IR ANGIO VERTEBRAL SEL VERTEBRAL BILAT MOD SED 06/05/2016 Consuella Lose, MD [facility]  . IR GENERIC HISTORICAL  06/05/2016   IR ANGIO INTRA EXTRACRAN SEL INTERNAL CAROTID BILAT MOD SED 06/05/2016 Consuella Lose, MD [facility]  . IR GENERIC HISTORICAL  07/03/2016   IR ANGIOGRAM FOLLOW UP STUDY 07/03/2016 Consuella Lose, MD [facility]  . IR GENERIC HISTORICAL  07/03/2016   IR TRANSCATH/EMBOLIZ 07/03/2016 Consuella Lose, MD [facility]  . LUMBAR LAMINECTOMY/DECOMPRESSION MICRODISCECTOMY Left 03/17/2013   Procedure: LUMBAR LAMINECTOMY/DECOMPRESSION IF RECESS STENOSIS FORAMINOTOMIE OF L4 ROOT AND L5,MICRODISECTOMY  L4-5 ON LEFT;  Surgeon: Tobi Bastos, MD;  Location: [facility];  Service: Orthopedics;  Laterality: Left;  . RADIOLOGY WITH ANESTHESIA N/A 07/03/2016   Procedure: arteriogram, pipeline embolization of aneurysm;  Surgeon: Consuella Lose, MD;  Location: [facility];  Service: Radiology;  Laterality: N/A;  . TONSILLECTOMY  1953  . TUBAL LIGATION      FAMILY HISTORY: family history includes Pancreatic cancer in her brother.  SOCIAL HISTORY:  reports that she has been smoking Cigarettes.  She has a 26.00 pack-year smoking history. She has never used smokeless tobacco. She reports that she drinks alcohol. She reports that she does not use drugs.  ALLERGIES: Plavix [clopidogrel] and Prednisone  MEDICATIONS:  Current Outpatient Prescriptions  Medication  Sig Dispense Refill  . amitriptyline (ELAVIL) 100 MG tablet TAKE 3 TABLETS BY MOUTH EVERY DAY (Patient taking differently: Take 300 mg by mouth at bedtime) 270 tablet 1  . aspirin (ASPIRIN CHILDRENS) 81 MG chewable tablet Chew 1  tablet (81 mg total) by mouth daily.    . diphenhydramine-acetaminophen (TYLENOL PM) 25-500 MG TABS tablet Take 1 tablet by mouth at bedtime.    . DULoxetine (CYMBALTA) 30 MG capsule TAKE 1 CAPSULE (30 MG TOTAL) BY MOUTH DAILY. 30 capsule 0  . levothyroxine (SYNTHROID) 75 MCG tablet TAKE 1 TABLET BY MOUTH DAILY BEFORE BREAKFAST. (Patient taking differently: Take 75 mcg by mouth daily before breakfast. ) 90 tablet 3  . pantoprazole (PROTONIX) 40 MG tablet Take 1 tablet (40 mg total) by mouth 2 (two) times daily. 60 tablet 0  . polyethylene glycol (MIRALAX / GLYCOLAX) packet Take 17 g by mouth daily.     . potassium chloride SA (K-DUR,KLOR-CON) 20 MEQ tablet Take 2 tablets (40 mEq total) by mouth daily. 30 tablet 0  . sucralfate (CARAFATE) 1 g tablet Take 1 tablet (1 g total) by mouth 4 (four) times daily -  with meals and at bedtime. 90 tablet 0  . ticagrelor (BRILINTA) 90 MG TABS tablet Take 1 tablet (90 mg total) by mouth 2 (two) times daily. 60 tablet 6  . traMADol (ULTRAM) 50 MG tablet Take 1 tablet (50 mg total) by mouth every 6 (six) hours. 30 tablet 0  . gabapentin (NEURONTIN) 300 MG capsule TAKE ONE CAPSULE IN THE DAYTIME AND 2 CAPSULES AT BEDTIME (Patient not taking: Reported on 08/28/2016) 270 capsule 0  . nicotine (NICODERM CQ) 14 mg/24hr patch Place 1 patch (14 mg total) onto the skin daily. (Patient not taking: Reported on 08/28/2016) 28 patch 0   No current facility-administered medications for this encounter.     REVIEW OF SYSTEMS:  A 15 point review of systems is documented in the electronic medical record. This was obtained by the nursing staff. However, I reviewed this with the patient to discuss relevant findings and make appropriate changes.  She denies any chest pain cough or hemoptysis, she denies any bladder incontinence or difficulty with urination. She denies any fecal incontinence   PHYSICAL EXAM:  height is '5\' 2"'$  (1.575 m) and weight is 93 lb 12.8 oz (42.5 kg). Her oral  temperature is 97.9 F (36.6 C). Her blood pressure is 138/79 and her pulse is 96. Her oxygen saturation is 97%.   General: Alert and oriented, in no acute distress HEENT: Head is normocephalic. Extraocular movements are intact. Oropharynx is clear. Neck: Neck is supple, no palpable cervical or supraclavicular lymphadenopathy. Heart: Regular in rate and rhythm with no murmurs, rubs, or gallops. Chest: Clear to auscultation bilaterally, with no rhonchi, wheezes, or rales. Abdomen: Soft, nontender, nondistended, with no rigidity or guarding. Extremities: No cyanosis or edema. Lymphatics: see Neck Exam Skin: No concerning lesions. Musculoskeletal: symmetric strength and muscle tone throughout. Neurologic: Cranial nerves II through XII are grossly intact. No obvious focalities. Speech is fluent. Slight right grip weakness.  Unsteady with walking. Psychiatric: Judgment and insight are intact. Affect is appropriate.   ECOG = 2   LABORATORY DATA:  Lab Results  Component Value Date   WBC 8.6 08/25/2016   HGB 10.6 (L) 08/25/2016   HCT 31.8 (L) 08/25/2016   MCV 92.2 08/25/2016   PLT 503.0 (H) 08/25/2016   NEUTROABS 7.0 08/25/2016   Lab Results  Component Value Date   NA  136 08/25/2016   K 4.4 08/25/2016   CL 104 08/25/2016   CO2 24 08/25/2016   GLUCOSE 90 08/25/2016   CREATININE 0.94 08/25/2016   CALCIUM 9.1 08/25/2016      RADIOGRAPHY: Dg Chest 1 View  Result Date: 08/21/2016 CLINICAL DATA:  Post biopsy, possible pneumothorax EXAM: CHEST 1 VIEW COMPARISON:  Twelve/ 27/17 FINDINGS: Again noted right lower lobe mass. Mild right basilar atelectasis. No evidence of pneumothorax. Left lung is clear. IMPRESSION: Persistent mass in right lower lobe. Mild right basilar atelectasis. No evidence of pneumothorax. Electronically Signed   By: Lahoma Crocker M.D.   On: 08/21/2016 13:56   Ct Chest W Contrast  Result Date: 08/19/2016 CLINICAL DATA:  Right hilar mass on chest x-ray, nausea,  shortness of breath EXAM: CT CHEST WITH CONTRAST TECHNIQUE: Multidetector CT imaging of the chest was performed during intravenous contrast administration. CONTRAST:  1m ISOVUE-300 IOPAMIDOL (ISOVUE-300) INJECTION 61% COMPARISON:  Chest x-ray of 08/19/2016 FINDINGS: Cardiovascular: Coronary artery calcifications are noted diffusely, primarily involving the left anterior descending coronary artery. The heart is within normal limits in size. No pericardial effusion is seen. The mid ascending thoracic aorta measures 34 mm in diameter. The pulmonary arteries opacify with no central abnormality noted. Mediastinum/Nodes: The mass within the superior segment right lower lobe extending in the posterior inferior right upper lobe does appear to extend into the mediastinum with loss of adjacent fat planes, suggesting direct mediastinal invasion. No definite adenopathy is seen although subcarinal nodes may well be present. Lungs/Pleura: The right perihilar mass described on chest x-ray represents a large solid mass involving the superior segment of the right lower lobe in apparently invading into the posterior inferior aspect of the right upper lobe. In its largest axial plane this mass measures 6.9 x 7.2 cm with a height of approximately 6.6 cm. There may also be direct invasion of the local paravertebral soft tissues. This lesion is consistent with primary lung carcinoma. There is an 8 mm poorly defined opacity within the right upper lobe posteriorly worrisome for metastatic lesion. Some volume loss is present posteriorly in the right lower lobe. No lesion is seen throughout the left lung. Upper Abdomen: The portion of the upper abdomen that is visualized is unremarkable. Musculoskeletal: There is blastic involvement of T7 vertebral body and to a lesser degree T6 with some involvement of posterior elements consistent with bone metastases. IMPRESSION: 1. Solid mass within the superior segment of the right lower lobe  apparently extending into the posterior inferior right upper lobe as well as the mediastinum and adjacent paravertebral soft tissues consistent with primary lung carcinoma of 6.9 x 7.2 x 6.6 cm. 2. Metastatic involvement of T7 and T6 vertebral body with with some involvement of posterior elements at these levels. 3. Coronary artery calcifications. Electronically Signed   By: PIvar DrapeM.D.   On: 08/19/2016 13:45   Mr MJodene NamNeck W Wo Contrast  Result Date: 08/19/2016 CLINICAL DATA:  Follow-up acute LEFT MCA territory infarct. 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 EXAM: MRA NECK WITHOUT AND WITH CONTRAST MRA HEAD WITHOUT CONTRAST MRI HEAD WITH CONTRAST TECHNIQUE: Multiplanar and multiecho pulse sequences of the neck were obtained without and with intravenous contrast. Angiographic images of the neck were obtained using MRA technique without and with intravenous contrast.; Angiographic images of the Circle of Willis were obtained using MRA technique without intravenous contrast. Multiplanar multi P pulse sequences of the head obtained with contrast. CONTRAST:  152mMULTIHANCE GADOBENATE  DIMEGLUMINE 529 MG/ML IV SOLN COMPARISON:  CT angiogram of the head May 13, 2016 and MRI head August 19, 2016 at 1646 hours FINDINGS: MRI HEAD FINDINGS No abnormal parenchymal enhancement with particular attention LEFT inferior frontal lobe at site of susceptibility artifact. No abnormal extra-axial enhancement. MRA NECK FINDINGS ANTERIOR CIRCULATION: 2 vessel arch. The common carotid arteries are widely patent bilaterally. The carotid bifurcation is patent bilaterally and there is no hemodynamically significant carotid stenosis by NASCET criteria. Tortuous RIGHT internal carotid artery. No evidence for atherosclerosis or flow limiting stenosis of the cervical internal carotid arteries. POSTERIOR CIRCULATION: Bilateral vertebral arteries are patent to the vertebrobasilar junction. Tortuous  vessels. No evidence for atherosclerosis or flow limiting stenosis. Source images and MIP image were reviewed. MRA HEAD FINDINGS ANTERIOR CIRCULATION: Normal flow related enhancement of the included cervical, petrous, RIGHT cavernous and RIGHT supraclinoid internal carotid arteries. Loss of signal LEFT cavernous and supraclinoid internal carotid artery at level of pipeline stent. 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 Patent anterior communicating artery. Normal flow related enhancement of the anterior and middle cerebral arteries, including distal segments. POSTERIOR CIRCULATION: Codominant vertebral artery's. Basilar artery is patent, with normal flow related enhancement of the main branch vessels. Fenestrated proximal basilar artery. Bilateral posterior communicating artery present. Normal flow related enhancement of the posterior cerebral arteries. No large vessel occlusion, high-grade stenosis, abnormal luminal irregularity, aneurysm. IMPRESSION: MRI HEAD: No abnormal enhancement. MRA HEAD: LEFT internal carotid artery pipeline stent, patency cannot accurately be determined by MR. No emergent large vessel occlusion. 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 MRA NECK:  No hemodynamically significant stenosis. Electronically Signed   By: Elon Alas M.D.   On: 08/19/2016 21:50   Mr Brain Wo Contrast  Result Date: 08/19/2016 CLINICAL DATA:  Right hilar mass.  Right upper extremity weakness. EXAM: MRI HEAD WITHOUT CONTRAST TECHNIQUE: Multiplanar, multiecho pulse sequences of the brain and surrounding structures were obtained without intravenous contrast. COMPARISON:  CTA head neck 04/2016.  Brain MRI 05/04/2014 FINDINGS: Brain: There is a cluster of acute infarcts in the cortical and subcortical left frontal parietal junction along the central sulcus. Punctate cortical infarct in the superior left temporal gyrus and  in the left occipital parietal region. There are interval but chronic infarcts also seen in the anterior left frontal lobe, which likely accounts for nonacute blood products in the inferior left frontal lobe. Patient will need postcontrast imaging to evaluate the focus of hemorrhage and white matter changes in this patient with right hilar mass. Microvascular ischemic change in the bilateral cerebral white matter. Rounded pontine T2 hyperintensity on the right, with hemosiderin staining. The this could reflect a capillary telangiectasia or remote infarct. No evidence of subarachnoid hemorrhage. Vascular: Normal flow voids.  Known intracranial aneurysms. Skull and upper cervical spine: Negative Sinuses/Orbits: Negative IMPRESSION: 1. Cluster of small acute infarcts around the left central sulcus. There also small acute and remote infarcts in the left MCA border zones. Recommend angiographic follow-up in this patient with ipsilateral ICA to MCA stent. 2. Focus of nonacute blood products in the inferior left frontal cortex is likely post ischemic given #1. When appropriate, recommend postcontrast evaluation in this patient with right hilar mass. Electronically Signed   By: Monte Fantasia M.D.   On: 08/19/2016 17:17   Mr Brain W Contrast  Result Date: 08/19/2016 CLINICAL DATA:  Follow-up acute LEFT MCA territory infarct. History of LEFT internal carotid artery pipeline stent for  aneurysms. EXAM: MRA NECK WITHOUT AND WITH CONTRAST MRA HEAD WITHOUT CONTRAST MRI HEAD WITH CONTRAST TECHNIQUE: Multiplanar and multiecho pulse sequences of the neck were obtained without and with intravenous contrast. Angiographic images of the neck were obtained using MRA technique without and with intravenous contrast.; Angiographic images of the Circle of Willis were obtained using MRA technique without intravenous contrast. Multiplanar multi P pulse sequences of the head obtained with contrast. CONTRAST:  82m MULTIHANCE GADOBENATE  DIMEGLUMINE 529 MG/ML IV SOLN COMPARISON:  CT angiogram of the head May 13, 2016 and MRI head August 19, 2016 at 1646 hours FINDINGS: MRI HEAD FINDINGS No abnormal parenchymal enhancement with particular attention LEFT inferior frontal lobe at site of susceptibility artifact. No abnormal extra-axial enhancement. MRA NECK FINDINGS ANTERIOR CIRCULATION: 2 vessel arch. The common carotid arteries are widely patent bilaterally. The carotid bifurcation is patent bilaterally and there is no hemodynamically significant carotid stenosis by NASCET criteria. Tortuous RIGHT internal carotid artery. No evidence for atherosclerosis or flow limiting stenosis of the cervical internal carotid arteries. POSTERIOR CIRCULATION: Bilateral vertebral arteries are patent to the vertebrobasilar junction. Tortuous vessels. No evidence for atherosclerosis or flow limiting stenosis. Source images and MIP image were reviewed. MRA HEAD FINDINGS ANTERIOR CIRCULATION: Normal flow related enhancement of the included cervical, petrous, RIGHT cavernous and RIGHT supraclinoid internal carotid arteries. Loss of signal LEFT cavernous and supraclinoid internal carotid artery at level of pipeline stent. 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 Patent anterior communicating artery. Normal flow related enhancement of the anterior and middle cerebral arteries, including distal segments. POSTERIOR CIRCULATION: Codominant vertebral artery's. Basilar artery is patent, with normal flow related enhancement of the main branch vessels. Fenestrated proximal basilar artery. Bilateral posterior communicating artery present. Normal flow related enhancement of the posterior cerebral arteries. No large vessel occlusion, high-grade stenosis, abnormal luminal irregularity, aneurysm. IMPRESSION: MRI HEAD: No abnormal enhancement. MRA HEAD: LEFT internal carotid artery pipeline stent, patency cannot accurately  be determined by MR. No emergent large vessel occlusion. 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 MRA NECK:  No hemodynamically significant stenosis. Electronically Signed   By: CElon AlasM.D.   On: 08/19/2016 21:50   Ct Biopsy  Result Date: 08/21/2016 CLINICAL DATA:  Presentation with 7 cm right lower lobe lung mass. EXAM: CT GUIDED CORE BIOPSY OF RIGHT LUNG MASS ANESTHESIA/SEDATION: 1.0 mg IV Versed; 50 mcg IV Fentanyl Total Moderate Sedation Time:  15 minutes. The patient's level of consciousness and physiologic status were continuously monitored during the procedure by Radiology nursing. PROCEDURE: The procedure risks, benefits, and alternatives were explained to the patient. Questions regarding the procedure were encouraged and answered. The patient understands and consents to the procedure. A time-out was performed prior to the procedure. The posterior right chest wall was prepped with chlorhexidine in a sterile fashion, and a sterile drape was applied covering the operative field. A sterile gown and sterile gloves were used for the procedure. Local anesthesia was provided with 1% Lidocaine. CT was performed through the chest in a prone position. Under CT guidance, a 17 gauge needle was advanced into the posterior right lower lobe at the level of a right lower lobe lung mass. Two separate 18 gauge coaxial core biopsy samples were obtained. Additional imaging was performed after biopsy. COMPLICATIONS: Small right pneumothorax.  SIR level A: No therapy, no consequence. FINDINGS: Large posterior right lower lobe lung mass again noted measuring approximately 7 cm in greatest diameter. Solid  tissue was obtained from the mass. Postprocedural imaging shows a small posterior pneumothorax on completion. This will be followed by chest x-ray. The patient was asymptomatic after the procedure with normal oxygen saturation. IMPRESSION: CT-guided core biopsy performed of a right  lower lobe lung mass. The procedure was complicated by a small posterior right pneumothorax. This will be followed by chest x-ray. Electronically Signed   By: Aletta Edouard M.D.   On: 08/21/2016 12:02   Dg Abd 2 Views  Result Date: 08/19/2016 CLINICAL DATA:  Small-bowel obstruction EXAM: ABDOMEN - 2 VIEW COMPARISON:  1134 hours on the same day FINDINGS: Scattered nondistended air containing small bowel loops with a few scattered air-fluid levels are again noted suggesting adynamic ileus contrast opacifies the urinary bladder. There is lower lumbar degenerative disc disease from L3 through S1. No free air is identified. Right hilar mass is again seen. IMPRESSION: No significant change in the appearance of the bowel gas pattern possibly representing adynamic ileus given scattered air containing nondistended small bowel loops with scattered air-fluid levels. Right hilar masslike abnormality is again seen. Electronically Signed   By: Ashley Royalty M.D.   On: 08/19/2016 21:40   Dg Abd Acute W/chest  Result Date: 08/19/2016 CLINICAL DATA:  Abdominal pain intermittently over the last 1 year. EXAM: DG ABDOMEN ACUTE W/ 1V CHEST COMPARISON:  Two-view chest x-ray 03/16/2013. FINDINGS: The heart size is normal. A right perihilar mass is suspected. Aortic atherosclerosis is present. Emphysematous changes are again noted. No focal airspace disease is present. Fluid levels are present within nondilated loops of large and small bowel. The no obstruction or free air. Progressive scoliosis is present at L3-4 and L4-5 with asymmetric endplate change on the right at L3-4 and on the left at L4-5. IMPRESSION: 1. Right parahilar mass. Recommend CT of the chest with contrast for further evaluation. 2. Emphysema. 3. Fluid levels within nondilated loops of large and small bowel suggesting an adynamic ileus without obstruction. 4. Progressive degenerative change and scoliosis in the lumbar spine. These results were called by  telephone at the time of interpretation on 08/19/2016 at 11:59 am to Dr. Tanna Furry , who verbally acknowledged these results. Electronically Signed   By: San Morelle M.D.   On: 08/19/2016 11:59   Mr Jodene Nam Head/brain PI Cm  Result Date: 08/19/2016 CLINICAL DATA:  Follow-up acute LEFT MCA territory infarct. 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 EXAM: MRA NECK WITHOUT AND WITH CONTRAST MRA HEAD WITHOUT CONTRAST MRI HEAD WITH CONTRAST TECHNIQUE: Multiplanar and multiecho pulse sequences of the neck were obtained without and with intravenous contrast. Angiographic images of the neck were obtained using MRA technique without and with intravenous contrast.; Angiographic images of the Circle of Willis were obtained using MRA technique without intravenous contrast. Multiplanar multi P pulse sequences of the head obtained with contrast. CONTRAST:  21m MULTIHANCE GADOBENATE DIMEGLUMINE 529 MG/ML IV SOLN COMPARISON:  CT angiogram of the head May 13, 2016 and MRI head August 19, 2016 at 1646 hours FINDINGS: MRI HEAD FINDINGS No abnormal parenchymal enhancement with particular attention LEFT inferior frontal lobe at site of susceptibility artifact. No abnormal extra-axial enhancement. MRA NECK FINDINGS ANTERIOR CIRCULATION: 2 vessel arch. The common carotid arteries are widely patent bilaterally. The carotid bifurcation is patent bilaterally and there is no hemodynamically significant carotid stenosis by NASCET criteria. Tortuous RIGHT internal carotid artery. No evidence for atherosclerosis or flow limiting stenosis of the cervical internal carotid arteries. POSTERIOR CIRCULATION: Bilateral vertebral arteries  are patent to the vertebrobasilar junction. Tortuous vessels. No evidence for atherosclerosis or flow limiting stenosis. Source images and MIP image were reviewed. MRA HEAD FINDINGS ANTERIOR CIRCULATION: Normal flow related enhancement of the included cervical, petrous,  RIGHT cavernous and RIGHT supraclinoid internal carotid arteries. Loss of signal LEFT cavernous and supraclinoid internal carotid artery at level of pipeline stent. 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 Patent anterior communicating artery. Normal flow related enhancement of the anterior and middle cerebral arteries, including distal segments. POSTERIOR CIRCULATION: Codominant vertebral artery's. Basilar artery is patent, with normal flow related enhancement of the main branch vessels. Fenestrated proximal basilar artery. Bilateral posterior communicating artery present. Normal flow related enhancement of the posterior cerebral arteries. No large vessel occlusion, high-grade stenosis, abnormal luminal irregularity, aneurysm. IMPRESSION: MRI HEAD: No abnormal enhancement. MRA HEAD: LEFT internal carotid artery pipeline stent, patency cannot accurately be determined by MR. No emergent large vessel occlusion. 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 MRA NECK:  No hemodynamically significant stenosis. Electronically Signed   By: Elon Alas M.D.   On: 08/19/2016 21:50   US Abdomen Limited Ruq  Result Date: 08/19/2016 CLINICAL DATA:  71 year old female with right upper quadrant abdominal pain and nausea since yesterday. Initial encounter. EXAM: US ABDOMEN LIMITED - RIGHT UPPER QUADRANT COMPARISON:  CT Abdomen and Pelvis 07/23/2016 FINDINGS: Gallbladder: No gallstones or wall thickening visualized. No sonographic Murphy sign noted by sonographer. Common bile duct: Diameter: 5 mm, normal Liver: Background liver echogenicity is within normal limits. An apparent small echogenic focus with shadowing in the right liver on image 29 is not correlated on the recent CT Abdomen and Pelvis and is probably an artifact or pseudo-lesion. No discrete liver lesion. Other findings: Negative sonographic appearance of the visible right  kidney (image 40). IMPRESSION: Negative right upper quadrant ultrasound. Electronically Signed   By: Genevie Ann M.D.   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 Reviewing the patient's chest CT scan at [facility], it was felt the patient's tumor was possibly extending into the spinal canal and it was recommended the patient proceed with thoracic MRI for further evaluation as soon as possible. The patient likely has stage IV disease but we will order a PET scan to verify this. I would recommend radiation therapy to the large right lung mass in light of possible extension into the right spinal canal. Patient will receive 3-6 weeks of radiation therapy depending upon staging work up and input from medical oncology. I discussed the course of treatment, side effects, and potential toxicities with the patient and her husband. She appears to understand and wishes to proceed with treatment. A consent form was signed and a copy was placed in the patient's chart.  PLAN: CT simulation and treatment planning is scheduled for 08/31/16 at 9 am. Anticipate treatment to begin on 09/02/16.   Marland Kitchen   ------------------------------------------------  Blair Promise, PhD, MD  This document serves as a record of services personally performed by Gery Pray, MD. It was created on his behalf by Bethann Humble, a trained medical scribe. The creation of this record is based on the scribe's personal observations and the provider's statements to them. This document has been checked and approved by the attending provider.

## 2016-08-28 NOTE — Telephone Encounter (Signed)
Please do official referral to oncology   Also referral to radiation oncology Dx  Is  Adenocarcinoma  lung

## 2016-08-28 NOTE — Telephone Encounter (Signed)
Oncology Nurse Navigator Documentation  Oncology Nurse Navigator Flowsheets 08/28/2016  Navigator Location [facility]  Navigator Encounter Type Telephone/Diane RN received a call from [name].  He was confused about appt for Dr. Julien [name]. I clarified with [name] scheduler.  I then called [name] to clarify appt.  Patient will be seen with Dr. Julien [name] on 09/01/16 at 3:45  Telephone Outgoing Call  Treatment Phase Pre-Tx/Tx Discussion  Barriers/Navigation Needs Coordination of Care  Interventions Coordination of Care  Coordination of Care Appts  Acuity Level 2  Acuity Level 2 Assistance expediting appointments  Time Spent with Patient 30

## 2016-08-30 ENCOUNTER — Encounter: Payer: Self-pay | Admitting: Internal Medicine

## 2016-08-31 ENCOUNTER — Ambulatory Visit
Admission: RE | Admit: 2016-08-31 | Discharge: 2016-08-31 | Disposition: A | Discharge status: Home / Self Care | Payer: Medicare Other | Source: Ambulatory Visit | Attending: Radiation Oncology | Admitting: Radiation Oncology

## 2016-08-31 ENCOUNTER — Telehealth: Payer: Self-pay | Admitting: *Deleted

## 2016-08-31 DIAGNOSIS — R531 Weakness: Secondary | ICD-10-CM | POA: Diagnosis not present

## 2016-08-31 DIAGNOSIS — K219 Gastro-esophageal reflux disease without esophagitis: Secondary | ICD-10-CM | POA: Diagnosis not present

## 2016-08-31 DIAGNOSIS — Z7982 Long term (current) use of aspirin: Secondary | ICD-10-CM | POA: Diagnosis not present

## 2016-08-31 DIAGNOSIS — I2510 Atherosclerotic heart disease of native coronary artery without angina pectoris: Secondary | ICD-10-CM | POA: Diagnosis not present

## 2016-08-31 DIAGNOSIS — Z85828 Personal history of other malignant neoplasm of skin: Secondary | ICD-10-CM | POA: Diagnosis not present

## 2016-08-31 DIAGNOSIS — C3431 Malignant neoplasm of lower lobe, right bronchus or lung: Secondary | ICD-10-CM

## 2016-08-31 DIAGNOSIS — Z888 Allergy status to other drugs, medicaments and biological substances status: Secondary | ICD-10-CM | POA: Diagnosis not present

## 2016-08-31 DIAGNOSIS — Z8673 Personal history of transient ischemic attack (TIA), and cerebral infarction without residual deficits: Secondary | ICD-10-CM | POA: Diagnosis not present

## 2016-08-31 DIAGNOSIS — Z9889 Other specified postprocedural states: Secondary | ICD-10-CM | POA: Diagnosis not present

## 2016-08-31 DIAGNOSIS — Z79899 Other long term (current) drug therapy: Secondary | ICD-10-CM | POA: Diagnosis not present

## 2016-08-31 DIAGNOSIS — Z510 Encounter for antineoplastic radiation therapy: Secondary | ICD-10-CM | POA: Diagnosis not present

## 2016-08-31 DIAGNOSIS — I671 Cerebral aneurysm, nonruptured: Secondary | ICD-10-CM | POA: Diagnosis not present

## 2016-08-31 DIAGNOSIS — E039 Hypothyroidism, unspecified: Secondary | ICD-10-CM | POA: Diagnosis not present

## 2016-08-31 DIAGNOSIS — M419 Scoliosis, unspecified: Secondary | ICD-10-CM | POA: Diagnosis not present

## 2016-08-31 DIAGNOSIS — J439 Emphysema, unspecified: Secondary | ICD-10-CM | POA: Diagnosis not present

## 2016-08-31 DIAGNOSIS — L719 Rosacea, unspecified: Secondary | ICD-10-CM | POA: Diagnosis not present

## 2016-08-31 MED ORDER — TRAMADOL HCL 50 MG PO TABS: ORAL_TABLET | ORAL | 0 refills | Status: DC

## 2016-08-31 MED ORDER — SUCRALFATE 1 G PO TABS: 1.0000 g | ORAL_TABLET | Freq: Three times a day (TID) | ORAL | 1 refills | Status: AC

## 2016-08-31 MED ORDER — PANTOPRAZOLE SODIUM 40 MG PO TBEC: 40.0000 mg | DELAYED_RELEASE_TABLET | Freq: Two times a day (BID) | ORAL | 1 refills | Status: AC

## 2016-08-31 MED ORDER — POTASSIUM CHLORIDE CRYS ER 20 MEQ PO TBCR: 20.0000 meq | EXTENDED_RELEASE_TABLET | Freq: Every day | ORAL | 0 refills | Status: AC

## 2016-08-31 NOTE — Telephone Encounter (Signed)
Called patient to inform of Pet Scan on 09-14-16- arrival time - 10:30 am, pt. to be npo - 6 hrs. Prior to test @ [facility] and her MRI to be on 09-14-16 - arrival time - 12:45 pm @ [facility], spoke with patient's husband- Gwyndolyn Saxon and he is aware of these tests

## 2016-08-31 NOTE — Telephone Encounter (Signed)
xxxx 

## 2016-08-31 NOTE — Progress Notes (Addendum)
  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  NARRATIVE:  The patient was brought to the [facility].  Identity was confirmed.  All relevant records and images related to the planned course of therapy were reviewed.  The patient freely provided informed written consent to proceed with treatment after reviewing the details related to the planned course of therapy. The consent form was witnessed and verified by the simulation staff.  Then, the patient was set-up in a stable reproducible  supine position for radiation therapy.  CT images were obtained.  Surface markings were placed.  The CT images were loaded into the planning software.  Then the target and avoidance structures were contoured.  Treatment planning then occurred.  The radiation prescription was entered and confirmed.  Then, I designed and supervised the construction of a total of 5 medically necessary complex treatment devices.  I have requested : 3D Simulation  I have requested a DVH of the following structures: GTV, PTV, heart, lungs, spinal cord, and esophagus.  I have ordered:dose calc.  PLAN:  The patient will receive 35 Gy in 14 fractions.  -----------------------------------     Blair Promise, PhD, MD    This document serves as a record of services personally performed by Gery Pray, MD. It was created on his behalf by Bethann Humble, a trained medical scribe. The creation of this record is based on the scribe's personal observations and the provider's statements to them. This document has been checked and approved by the attending provider.

## 2016-08-31 NOTE — Telephone Encounter (Signed)
  Can rx tramadol 50 mg  1 po q6 hours prn pain  Disp 60 refill x 1   Can refill potassium  X 1 month But go back down to  1 pill per day    Refill protonix  And sulcrafate    For 2 months

## 2016-09-01 ENCOUNTER — Encounter: Payer: Self-pay | Admitting: Internal Medicine

## 2016-09-01 ENCOUNTER — Encounter: Payer: Medicare Other | Admitting: *Deleted

## 2016-09-01 ENCOUNTER — Ambulatory Visit (HOSPITAL_BASED_OUTPATIENT_CLINIC_OR_DEPARTMENT_OTHER): Payer: Medicare Other | Admitting: Internal Medicine

## 2016-09-01 ENCOUNTER — Ambulatory Visit: Payer: Medicare Other | Admitting: Internal Medicine

## 2016-09-01 ENCOUNTER — Telehealth: Payer: Self-pay | Admitting: Internal Medicine

## 2016-09-01 ENCOUNTER — Encounter: Payer: Self-pay | Admitting: *Deleted

## 2016-09-01 ENCOUNTER — Other Ambulatory Visit: Payer: Medicare Other

## 2016-09-01 ENCOUNTER — Ambulatory Visit: Payer: Medicare Other | Admitting: Physical Therapy

## 2016-09-01 VITALS — BP 137/71 | HR 109 | Temp 98.4°F | Resp 16 | Ht 62.0 in | Wt 92.6 lb

## 2016-09-01 DIAGNOSIS — Z716 Tobacco abuse counseling: Secondary | ICD-10-CM

## 2016-09-01 DIAGNOSIS — C801 Malignant (primary) neoplasm, unspecified: Secondary | ICD-10-CM | POA: Diagnosis not present

## 2016-09-01 DIAGNOSIS — M546 Pain in thoracic spine: Secondary | ICD-10-CM

## 2016-09-01 DIAGNOSIS — Z720 Tobacco use: Secondary | ICD-10-CM | POA: Diagnosis not present

## 2016-09-01 DIAGNOSIS — C3431 Malignant neoplasm of lower lobe, right bronchus or lung: Secondary | ICD-10-CM

## 2016-09-01 DIAGNOSIS — Z7189 Other specified counseling: Secondary | ICD-10-CM

## 2016-09-01 DIAGNOSIS — R918 Other nonspecific abnormal finding of lung field: Secondary | ICD-10-CM

## 2016-09-01 DIAGNOSIS — C7951 Secondary malignant neoplasm of bone: Secondary | ICD-10-CM | POA: Diagnosis not present

## 2016-09-01 HISTORY — DX: Pain in thoracic spine: M54.6

## 2016-09-01 HISTORY — DX: Tobacco abuse counseling: Z71.6

## 2016-09-01 NOTE — Progress Notes (Signed)
[place] [phone]   [phone]  CONSULT NOTE  REFERRING PHYSICIAN: Dr. Shanon Ace  REASON FOR CONSULTATION:  71 years old white female recently diagnosed with lung cancer.  HPI CHARINA FONS is a 71 y.o. female with long history of smoking and past medical history significant for chronic back pain status post back surgery by Dr. 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 The patient mentions that 2 weeks ago she had severe abdominal pain and she presented to the emergency department for evaluation. She had acute abdominal series performed at that time on 08/19/2016 and it showed right parahilar mass. This was followed by CT scan of the chest with contrast on 08/19/2016 and it showed a large solid mass involving the superior segment of the right lower lobe apparently invading into the posterior-inferior aspect of the right upper lobe and it measured 6.9 x 7.2 x 6.6 cm. There may be also direct invasion of the local paravertebral soft tissue. The lesion was consistent with primary lung carcinoma. There was also 13m podia defined opacity within the right upper lobe posteriorly worrisome for metastatic lesion. There was metastatic involvement of T7 and T6 vertebral body with some involvement of the posterior element at these levels. The patient underwent CT-guided core biopsy of the right lung mass by interventional radiology on 08/21/2016 and the final pathology (([id number] showed adenocarcinoma, moderately differentiated. The tissue block was sent for molecular studies and PDL 1 expression. MRI of the brain on 08/19/2016 showed no evidence for metastatic disease to the brain but there was evidence for acute left MCA territory infarct secondary to surgical intervention for brain aneurysm. The patient was referred to me today for evaluation and recommendation regarding treatment of her  condition. When seen today she continues to have pain on the lower right side of her back which is currently around 4 on a scale from 1-10. She is currently on tramadol and gabapentin. She was seen by Dr. KSondra Comefor consideration of radiotherapy and she had simulation done yesterday. She is expected to have the first fraction of radiotherapy tomorrow. She continues to complain of dry cough but no significant shortness of breath or hemoptysis. She lost around 10 pounds in the last 2 months. She denied having any nausea or vomiting. She has no headache or visual changes.  HPI  Past Medical History:  Diagnosis Date  . Anemia   . Anxiety   . Basal cell carcinoma of skin    "cut and burned off; nothing in years" (08/20/2016)  . Depression   . Emphysema of lung ([facility] dx[place] 08/[place]/2016  . GERD (gastroesophageal reflux disease)    WOULD USE OTC MEDICATION IF NEEDED  . Headache    "probably monthly" (08/20/2016)  . Hypothyroidism   . Lung mass    "bx scheduled in am" (08/20/2016)  . 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 Pneumonia ~ 2010  . Rosacea   . Stroke ([facility] 08/19/2016   "right arm weaker speech issues; right face droops" (08/20/2016)  . Tachycardia    PT STATES HER HEART RATE USUALLY ABOVE 100  . TIA (transient ischemic attack)    "hx several small strokes in the past year" (08/20/2016)    Past Surgical History:  Procedure Laterality Date  . BACK SURGERY    . BASAL CELL CARCINOMA EXCISION     "arms; forehead"  . BREAST BIOPSY Left  benign  . BUNIONECTOMY  2003   bilateral  . IR GENERIC HISTORICAL  06/05/2016   IR ANGIO VERTEBRAL SEL VERTEBRAL BILAT MOD SED 06/05/2016 Consuella Lose, MD [facility]  . IR GENERIC HISTORICAL  06/05/2016   IR ANGIO INTRA EXTRACRAN SEL INTERNAL CAROTID BILAT MOD SED 06/05/2016 Consuella Lose, MD [facility]  . IR GENERIC HISTORICAL  07/03/2016   IR ANGIOGRAM FOLLOW UP STUDY 07/03/2016 Consuella Lose, MD [facility]   . IR GENERIC HISTORICAL  07/03/2016   IR TRANSCATH/EMBOLIZ 07/03/2016 Consuella Lose, MD [facility]  . LUMBAR LAMINECTOMY/DECOMPRESSION MICRODISCECTOMY Left 03/17/2013   Procedure: LUMBAR LAMINECTOMY/DECOMPRESSION IF RECESS STENOSIS FORAMINOTOMIE OF L4 ROOT AND L5,MICRODISECTOMY  L4-5 ON LEFT;  Surgeon: Tobi Bastos, MD;  Location: [facility];  Service: Orthopedics;  Laterality: Left;  . RADIOLOGY WITH ANESTHESIA N/A 07/03/2016   Procedure: arteriogram, pipeline embolization of aneurysm;  Surgeon: Consuella Lose, MD;  Location: [place] [facility];  Service: Radiology;  Laterality: N/A;  . TONSILLECTOMY  1953  . TUBAL LIGATION      Family History  Problem Relation Age of Onset  . Pancreatic cancer Brother   . Heart disease      Social History Social History  Substance Use Topics  . Smoking status: Current Every Day Smoker    Packs/day: 0.50    Years: 52.00    Types: Cigarettes  . Smokeless tobacco: Never Used     Comment: 5-6 cigarettes per day currently  . Alcohol use 0.0 oz/week     Comment: 08/20/2016 "maybe once/year"    Allergies  Allergen Reactions  . Plavix [Clopidogrel] Other (See Comments)    Affected concentration, arm movement, speech, and possibly caused numbness (right sided)  . Prednisone Nausea Only    Current Outpatient Prescriptions  Medication Sig Dispense Refill  . amitriptyline (ELAVIL) 100 MG tablet TAKE 3 TABLETS BY MOUTH EVERY DAY (Patient taking differently: Take 300 mg by mouth at bedtime) 270 tablet 1  . aspirin (ASPIRIN CHILDRENS) 81 MG chewable tablet Chew 1 tablet (81 mg total) by mouth daily.    . diphenhydramine-acetaminophen (TYLENOL PM) 25-500 MG TABS tablet Take 1 tablet by mouth at bedtime.    . DULoxetine (CYMBALTA) 30 MG capsule TAKE 1 CAPSULE (30 MG TOTAL) BY MOUTH DAILY. 30 capsule 0  . gabapentin (NEURONTIN) 300 MG capsule TAKE ONE CAPSULE IN THE DAYTIME AND 2 CAPSULES AT BEDTIME 270 capsule 0  . levothyroxine (SYNTHROID) 75 MCG tablet  TAKE 1 TABLET BY MOUTH DAILY BEFORE BREAKFAST. (Patient taking differently: Take 75 mcg by mouth daily before breakfast. ) 90 tablet 3  . pantoprazole (PROTONIX) 40 MG tablet Take 1 tablet (40 mg total) by mouth 2 (two) times daily. 60 tablet 1  . polyethylene glycol (MIRALAX / GLYCOLAX) packet Take 17 g by mouth daily.     . potassium chloride SA (K-DUR,KLOR-CON) 20 MEQ tablet Take 1 tablet (20 mEq total) by mouth daily. 30 tablet 0  . sucralfate (CARAFATE) 1 g tablet Take 1 tablet (1 g total) by mouth 4 (four) times daily -  with meals and at bedtime. 120 tablet 1  . ticagrelor (BRILINTA) 90 MG TABS tablet Take 1 tablet (90 mg total) by mouth 2 (two) times daily. 60 tablet 6  . traMADol (ULTRAM) 50 MG tablet Take 1 tablet every 6 hours as needed for pain 60 tablet 0  . nicotine (NICODERM CQ) 14 mg/24hr patch Place 1 patch (14 mg total) onto the skin daily. (Patient not taking: Reported on 09/01/2016)  28 patch 0   No current facility-administered medications for this visit.     Review of Systems  Constitutional: positive for fatigue and weight loss Eyes: negative Ears, nose, mouth, throat, and face: negative Respiratory: positive for cough Cardiovascular: negative Gastrointestinal: negative Genitourinary:negative Integument/breast: negative Hematologic/lymphatic: negative Musculoskeletal:positive for back pain Neurological: negative Behavioral/Psych: negative Endocrine: negative Allergic/Immunologic: negative  Physical Exam  ION:GEXBM, healthy, no distress, malnourished and well developed SKIN: skin color, texture, turgor are normal, no rashes or significant lesions HEAD: Normocephalic, No masses, lesions, tenderness or abnormalities EYES: normal, PERRLA, Conjunctiva are pink and non-injected EARS: External ears normal, Canals clear OROPHARYNX:no exudate, no erythema and lips, buccal mucosa, and tongue normal  NECK: supple, no adenopathy, no JVD LYMPH:  no palpable  lymphadenopathy, no hepatosplenomegaly BREAST:not examined LUNGS: clear to auscultation , and palpation HEART: regular rate & rhythm, no murmurs and no gallops ABDOMEN:abdomen soft, non-tender, normal bowel sounds and no masses or organomegaly BACK: Back symmetric, no curvature., No CVA tenderness EXTREMITIES:no joint deformities, effusion, or inflammation, no edema, no skin discoloration  NEURO: alert & oriented x 3 with fluent speech, no focal motor/sensory deficits  PERFORMANCE STATUS: ECOG 1  LABORATORY DATA: Lab Results  Component Value Date   WBC 8.6 08/25/2016   HGB 10.6 (L) 08/25/2016   HCT 31.8 (L) 08/25/2016   MCV 92.2 08/25/2016   PLT 503.0 (H) 08/25/2016      Chemistry      Component Value Date/Time   NA 136 08/25/2016 1001   K 4.4 08/25/2016 1001   CL 104 08/25/2016 1001   CO2 24 08/25/2016 1001   BUN 6 08/25/2016 1001   CREATININE 0.94 08/25/2016 1001   CREATININE 0.79 06/19/2014 1044      Component Value Date/Time   CALCIUM 9.1 08/25/2016 1001   CALCIUM 9.1 09/05/2007 0000   ALKPHOS 82 08/19/2016 0915   AST 17 08/19/2016 0915   ALT 7 (L) 08/19/2016 0915   BILITOT 0.1 (L) 08/19/2016 0915       RADIOGRAPHIC STUDIES: Dg Chest 1 View  Result Date: 08/21/2016 CLINICAL DATA:  Post biopsy, possible pneumothorax EXAM: CHEST 1 VIEW COMPARISON:  Twelve/ 27/17 FINDINGS: Again noted right lower lobe mass. Mild right basilar atelectasis. No evidence of pneumothorax. Left lung is clear. IMPRESSION: Persistent mass in right lower lobe. Mild right basilar atelectasis. No evidence of pneumothorax. Electronically Signed   By: Lahoma Crocker M.D.   On: 08/21/2016 13:56   Ct Chest W Contrast  Result Date: 08/19/2016 CLINICAL DATA:  Right hilar mass on chest x-ray, nausea, shortness of breath EXAM: CT CHEST WITH CONTRAST TECHNIQUE: Multidetector CT imaging of the chest was performed during intravenous contrast administration. CONTRAST:  19m ISOVUE-300 IOPAMIDOL (ISOVUE-300)  INJECTION 61% COMPARISON:  Chest x-ray of 08/19/2016 FINDINGS: Cardiovascular: Coronary artery calcifications are noted diffusely, primarily involving the left anterior descending coronary artery. The heart is within normal limits in size. No pericardial effusion is seen. The mid ascending thoracic aorta measures 34 mm in diameter. The pulmonary arteries opacify with no central abnormality noted. Mediastinum/Nodes: The mass within the superior segment right lower lobe extending in the posterior inferior right upper lobe does appear to extend into the mediastinum with loss of adjacent fat planes, suggesting direct mediastinal invasion. No definite adenopathy is seen although subcarinal nodes may well be present. Lungs/Pleura: The right perihilar mass described on chest x-ray represents a large solid mass involving the superior segment of the right lower lobe in apparently invading into the posterior inferior  aspect of the right upper lobe. In its largest axial plane this mass measures 6.9 x 7.2 cm with a height of approximately 6.6 cm. There may also be direct invasion of the local paravertebral soft tissues. This lesion is consistent with primary lung carcinoma. There is an 8 mm poorly defined opacity within the right upper lobe posteriorly worrisome for metastatic lesion. Some volume loss is present posteriorly in the right lower lobe. No lesion is seen throughout the left lung. Upper Abdomen: The portion of the upper abdomen that is visualized is unremarkable. Musculoskeletal: There is blastic involvement of T7 vertebral body and to a lesser degree T6 with some involvement of posterior elements consistent with bone metastases. IMPRESSION: 1. Solid mass within the superior segment of the right lower lobe apparently extending into the posterior inferior right upper lobe as well as the mediastinum and adjacent paravertebral soft tissues consistent with primary lung carcinoma of 6.9 x 7.2 x 6.6 cm. 2. Metastatic  involvement of T7 and T6 vertebral body with with some involvement of posterior elements at these levels. 3. Coronary artery calcifications. Electronically Signed   By: Ivar Drape M.D.   On: 08/19/2016 13:45   Mr Jodene Nam Neck W Wo Contrast  Result Date: 08/19/2016 CLINICAL DATA:  Follow-up acute LEFT MCA territory infarct. 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 EXAM: MRA NECK WITHOUT AND WITH CONTRAST MRA HEAD WITHOUT CONTRAST MRI HEAD WITH CONTRAST TECHNIQUE: Multiplanar and multiecho pulse sequences of the neck were obtained without and with intravenous contrast. Angiographic images of the neck were obtained using MRA technique without and with intravenous contrast.; Angiographic images of the Circle of Willis were obtained using MRA technique without intravenous contrast. Multiplanar multi P pulse sequences of the head obtained with contrast. CONTRAST:  80m MULTIHANCE GADOBENATE DIMEGLUMINE 529 MG/ML IV SOLN COMPARISON:  CT angiogram of the head May 13, 2016 and MRI head August 19, 2016 at 1646 hours FINDINGS: MRI HEAD FINDINGS No abnormal parenchymal enhancement with particular attention LEFT inferior frontal lobe at site of susceptibility artifact. No abnormal extra-axial enhancement. MRA NECK FINDINGS ANTERIOR CIRCULATION: 2 vessel arch. The common carotid arteries are widely patent bilaterally. The carotid bifurcation is patent bilaterally and there is no hemodynamically significant carotid stenosis by NASCET criteria. Tortuous RIGHT internal carotid artery. No evidence for atherosclerosis or flow limiting stenosis of the cervical internal carotid arteries. POSTERIOR CIRCULATION: Bilateral vertebral arteries are patent to the vertebrobasilar junction. Tortuous vessels. No evidence for atherosclerosis or flow limiting stenosis. Source images and MIP image were reviewed. MRA HEAD FINDINGS ANTERIOR CIRCULATION: Normal flow related enhancement of the included cervical,  petrous, RIGHT cavernous and RIGHT supraclinoid internal carotid arteries. Loss of signal LEFT cavernous and supraclinoid internal carotid artery at level of pipeline stent. 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 Patent anterior communicating artery. Normal flow related enhancement of the anterior and middle cerebral arteries, including distal segments. POSTERIOR CIRCULATION: Codominant vertebral artery's. Basilar artery is patent, with normal flow related enhancement of the main branch vessels. Fenestrated proximal basilar artery. Bilateral posterior communicating artery present. Normal flow related enhancement of the posterior cerebral arteries. No large vessel occlusion, high-grade stenosis, abnormal luminal irregularity, aneurysm. IMPRESSION: MRI HEAD: No abnormal enhancement. MRA HEAD: LEFT internal carotid artery pipeline stent, patency cannot accurately be determined by MR. No emergent large vessel occlusion. 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 MRA NECK:  No hemodynamically significant stenosis. Electronically Signed  By: Elon Alas M.D.   On: 08/19/2016 21:50   Mr Brain Wo Contrast  Result Date: 08/19/2016 CLINICAL DATA:  Right hilar mass.  Right upper extremity weakness. EXAM: MRI HEAD WITHOUT CONTRAST TECHNIQUE: Multiplanar, multiecho pulse sequences of the brain and surrounding structures were obtained without intravenous contrast. COMPARISON:  CTA head neck 04/2016.  Brain MRI 05/04/2014 FINDINGS: Brain: There is a cluster of acute infarcts in the cortical and subcortical left frontal parietal junction along the central sulcus. Punctate cortical infarct in the superior left temporal gyrus and in the left occipital parietal region. There are interval but chronic infarcts also seen in the anterior left frontal lobe, which likely accounts for nonacute blood products in the inferior left frontal lobe.  Patient will need postcontrast imaging to evaluate the focus of hemorrhage and white matter changes in this patient with right hilar mass. Microvascular ischemic change in the bilateral cerebral white matter. Rounded pontine T2 hyperintensity on the right, with hemosiderin staining. The this could reflect a capillary telangiectasia or remote infarct. No evidence of subarachnoid hemorrhage. Vascular: Normal flow voids.  Known intracranial aneurysms. Skull and upper cervical spine: Negative Sinuses/Orbits: Negative IMPRESSION: 1. Cluster of small acute infarcts around the left central sulcus. There also small acute and remote infarcts in the left MCA border zones. Recommend angiographic follow-up in this patient with ipsilateral ICA to MCA stent. 2. Focus of nonacute blood products in the inferior left frontal cortex is likely post ischemic given #1. When appropriate, recommend postcontrast evaluation in this patient with right hilar mass. Electronically Signed   By: Monte Fantasia M.D.   On: 08/19/2016 17:17   Mr Brain W Contrast  Result Date: 08/19/2016 CLINICAL DATA:  Follow-up acute LEFT MCA territory infarct. 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 EXAM: MRA NECK WITHOUT AND WITH CONTRAST MRA HEAD WITHOUT CONTRAST MRI HEAD WITH CONTRAST TECHNIQUE: Multiplanar and multiecho pulse sequences of the neck were obtained without and with intravenous contrast. Angiographic images of the neck were obtained using MRA technique without and with intravenous contrast.; Angiographic images of the Circle of Willis were obtained using MRA technique without intravenous contrast. Multiplanar multi P pulse sequences of the head obtained with contrast. CONTRAST:  14m MULTIHANCE GADOBENATE DIMEGLUMINE 529 MG/ML IV SOLN COMPARISON:  CT angiogram of the head May 13, 2016 and MRI head August 19, 2016 at 1646 hours FINDINGS: MRI HEAD FINDINGS No abnormal parenchymal enhancement with particular  attention LEFT inferior frontal lobe at site of susceptibility artifact. No abnormal extra-axial enhancement. MRA NECK FINDINGS ANTERIOR CIRCULATION: 2 vessel arch. The common carotid arteries are widely patent bilaterally. The carotid bifurcation is patent bilaterally and there is no hemodynamically significant carotid stenosis by NASCET criteria. Tortuous RIGHT internal carotid artery. No evidence for atherosclerosis or flow limiting stenosis of the cervical internal carotid arteries. POSTERIOR CIRCULATION: Bilateral vertebral arteries are patent to the vertebrobasilar junction. Tortuous vessels. No evidence for atherosclerosis or flow limiting stenosis. Source images and MIP image were reviewed. MRA HEAD FINDINGS ANTERIOR CIRCULATION: Normal flow related enhancement of the included cervical, petrous, RIGHT cavernous and RIGHT supraclinoid internal carotid arteries. Loss of signal LEFT cavernous and supraclinoid internal carotid artery at level of pipeline stent. 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 Patent anterior communicating artery. Normal flow related enhancement of the anterior and middle cerebral arteries, including distal segments. POSTERIOR CIRCULATION: Codominant vertebral artery's. Basilar artery is patent, with normal flow related enhancement of the  main branch vessels. Fenestrated proximal basilar artery. Bilateral posterior communicating artery present. Normal flow related enhancement of the posterior cerebral arteries. No large vessel occlusion, high-grade stenosis, abnormal luminal irregularity, aneurysm. IMPRESSION: MRI HEAD: No abnormal enhancement. MRA HEAD: LEFT internal carotid artery pipeline stent, patency cannot accurately be determined by MR. No emergent large vessel occlusion. 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 MRA NECK:  No hemodynamically significant stenosis. Electronically Signed   By:  Elon Alas M.D.   On: 08/19/2016 21:50   Ct Biopsy  Result Date: 08/21/2016 CLINICAL DATA:  Presentation with 7 cm right lower lobe lung mass. EXAM: CT GUIDED CORE BIOPSY OF RIGHT LUNG MASS ANESTHESIA/SEDATION: 1.0 mg IV Versed; 50 mcg IV Fentanyl Total Moderate Sedation Time:  15 minutes. The patient's level of consciousness and physiologic status were continuously monitored during the procedure by Radiology nursing. PROCEDURE: The procedure risks, benefits, and alternatives were explained to the patient. Questions regarding the procedure were encouraged and answered. The patient understands and consents to the procedure. A time-out was performed prior to the procedure. The posterior right chest wall was prepped with chlorhexidine in a sterile fashion, and a sterile drape was applied covering the operative field. A sterile gown and sterile gloves were used for the procedure. Local anesthesia was provided with 1% Lidocaine. CT was performed through the chest in a prone position. Under CT guidance, a 17 gauge needle was advanced into the posterior right lower lobe at the level of a right lower lobe lung mass. Two separate 18 gauge coaxial core biopsy samples were obtained. Additional imaging was performed after biopsy. COMPLICATIONS: Small right pneumothorax.  SIR level A: No therapy, no consequence. FINDINGS: Large posterior right lower lobe lung mass again noted measuring approximately 7 cm in greatest diameter. Solid tissue was obtained from the mass. Postprocedural imaging shows a small posterior pneumothorax on completion. This will be followed by chest x-ray. The patient was asymptomatic after the procedure with normal oxygen saturation. IMPRESSION: CT-guided core biopsy performed of a right lower lobe lung mass. The procedure was complicated by a small posterior right pneumothorax. This will be followed by chest x-ray. Electronically Signed   By: Aletta Edouard M.D.   On: 08/21/2016 12:02   Dg  Abd 2 Views  Result Date: 08/19/2016 CLINICAL DATA:  Small-bowel obstruction EXAM: ABDOMEN - 2 VIEW COMPARISON:  1134 hours on the same day FINDINGS: Scattered nondistended air containing small bowel loops with a few scattered air-fluid levels are again noted suggesting adynamic ileus contrast opacifies the urinary bladder. There is lower lumbar degenerative disc disease from L3 through S1. No free air is identified. Right hilar mass is again seen. IMPRESSION: No significant change in the appearance of the bowel gas pattern possibly representing adynamic ileus given scattered air containing nondistended small bowel loops with scattered air-fluid levels. Right hilar masslike abnormality is again seen. Electronically Signed   By: Ashley Royalty M.D.   On: 08/19/2016 21:40   Dg Abd Acute W/chest  Result Date: 08/19/2016 CLINICAL DATA:  Abdominal pain intermittently over the last 1 year. EXAM: DG ABDOMEN ACUTE W/ 1V CHEST COMPARISON:  Two-view chest x-ray 03/16/2013. FINDINGS: The heart size is normal. A right perihilar mass is suspected. Aortic atherosclerosis is present. Emphysematous changes are again noted. No focal airspace disease is present. Fluid levels are present within nondilated loops of large and small bowel. The no obstruction or free air. Progressive scoliosis is present at L3-4 and L4-5 with asymmetric endplate  change on the right at L3-4 and on the left at L4-5. IMPRESSION: 1. Right parahilar mass. Recommend CT of the chest with contrast for further evaluation. 2. Emphysema. 3. Fluid levels within nondilated loops of large and small bowel suggesting an adynamic ileus without obstruction. 4. Progressive degenerative change and scoliosis in the lumbar spine. These results were called by telephone at the time of interpretation on 08/19/2016 at 11:59 am to Dr. Tanna Furry , who verbally acknowledged these results. Electronically Signed   By: San Morelle M.D.   On: 08/19/2016 11:59   Mr Jodene Nam  Head/brain SW Cm  Result Date: 08/19/2016 CLINICAL DATA:  Follow-up acute LEFT MCA territory infarct. 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 EXAM: MRA NECK WITHOUT AND WITH CONTRAST MRA HEAD WITHOUT CONTRAST MRI HEAD WITH CONTRAST TECHNIQUE: Multiplanar and multiecho pulse sequences of the neck were obtained without and with intravenous contrast. Angiographic images of the neck were obtained using MRA technique without and with intravenous contrast.; Angiographic images of the Circle of Willis were obtained using MRA technique without intravenous contrast. Multiplanar multi P pulse sequences of the head obtained with contrast. CONTRAST:  82m MULTIHANCE GADOBENATE DIMEGLUMINE 529 MG/ML IV SOLN COMPARISON:  CT angiogram of the head May 13, 2016 and MRI head August 19, 2016 at 1646 hours FINDINGS: MRI HEAD FINDINGS No abnormal parenchymal enhancement with particular attention LEFT inferior frontal lobe at site of susceptibility artifact. No abnormal extra-axial enhancement. MRA NECK FINDINGS ANTERIOR CIRCULATION: 2 vessel arch. The common carotid arteries are widely patent bilaterally. The carotid bifurcation is patent bilaterally and there is no hemodynamically significant carotid stenosis by NASCET criteria. Tortuous RIGHT internal carotid artery. No evidence for atherosclerosis or flow limiting stenosis of the cervical internal carotid arteries. POSTERIOR CIRCULATION: Bilateral vertebral arteries are patent to the vertebrobasilar junction. Tortuous vessels. No evidence for atherosclerosis or flow limiting stenosis. Source images and MIP image were reviewed. MRA HEAD FINDINGS ANTERIOR CIRCULATION: Normal flow related enhancement of the included cervical, petrous, RIGHT cavernous and RIGHT supraclinoid internal carotid arteries. Loss of signal LEFT cavernous and supraclinoid internal carotid artery at level of pipeline stent. 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 Patent anterior communicating artery. Normal flow related enhancement of the anterior and middle cerebral arteries, including distal segments. POSTERIOR CIRCULATION: Codominant vertebral artery's. Basilar artery is patent, with normal flow related enhancement of the main branch vessels. Fenestrated proximal basilar artery. Bilateral posterior communicating artery present. Normal flow related enhancement of the posterior cerebral arteries. No large vessel occlusion, high-grade stenosis, abnormal luminal irregularity, aneurysm. IMPRESSION: MRI HEAD: No abnormal enhancement. MRA HEAD: LEFT internal carotid artery pipeline stent, patency cannot accurately be determined by MR. No emergent large vessel occlusion. 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 MRA NECK:  No hemodynamically significant stenosis. Electronically Signed   By: CElon AlasM.D.   On: 08/19/2016 21:50   [place]dom[place] Limited Ruq  Result Date: 08/19/2016 CLINICAL DATA:  71year old female with right upper quadrant abdominal pain and nausea since yesterday. Initial encounter. EXAM: [place]DOM[place] LIMITED - RIGHT UPPER QUADRANT COMPARISON:  CT Abdomen and Pelvis 07/23/2016 FINDINGS: Gallbladder: No gallstones or wall thickening visualized. No sonographic Murphy sign noted by sonographer. Common bile duct: Diameter: 5 mm, normal Liver: Background liver echogenicity is within normal limits. An apparent small echogenic focus with shadowing in the right liver on image 29 is not correlated on the recent CT Abdomen and  Pelvis and is probably an artifact or pseudo-lesion. No discrete liver lesion. Other findings: Negative sonographic appearance of the visible right kidney (image 40). IMPRESSION: Negative right upper quadrant ultrasound. Electronically Signed   By: Genevie Ann M.D.   On: 08/19/2016 10:49    ASSESSMENT: This is a very pleasant 71 years old white female with highly  suspicious of stage IV (T3, N0, M1b) non-small cell lung cancer, adenocarcinoma presented with large right lower lobe lung mass with invasion of the thoracic vertebrae as well as questionable right upper lobe lung nodule diagnosed in December 2017.   PLAN: I had a lengthy discussion with the patient and her family today about her current disease is stage, prognosis and treatment options. I personally and independently reviewed the scan and showed the images to the patient and her family. I recommended for the patient to complete the staging workup by ordering a PET scan as well as MRI of the thoracic spine to rule out spinal cord involvement. I also requested the tissue block to be sent to [facility] one for molecular studies and PD L1 expression.  I recommended for the patient to continue her relative radiotherapy under the care of Dr. Sondra Come as a scheduled tomorrow. I had a lengthy discussion with the patient and her family about her prognosis and treatment options. I explained to them that she has incurable condition and or the treatment will be of palliative nature. I discussed with him the option of palliative care and hospice referral versus consideration of palliative systemic chemotherapy likely with carboplatin and Alimta versus treatment with targeted therapy or immunotherapy if the patient has the appropriate markers. I will arrange for the patient to come back for follow-up visit in around 2 weeks for reevaluation and more detailed discussion of her treatment options based on the final staging workup and molecular studies. During this time she will be receiving palliative radiotherapy to the large right lower lobe lung mass with bone invasion. For pain management, she will continue on tramadol and gabapentin. For smoke cessation, I strongly encouraged the patient to quit smoking. She was advised to call immediately if she has any concerning symptoms in the interval. The family has several  questions today and I answered them completely to their satisfaction. The patient voices understanding of current disease status and treatment options and is in agreement with the current care plan.  All questions were answered. The patient knows to call the clinic with any problems, questions or concerns. We can certainly see the patient much sooner if necessary.  Thank you so much for allowing me to participate in the care of [name]. I will continue to follow up the patient with you and assist in her care.  I spent 55 minutes counseling the patient face to face. The total time spent in the appointment was 80 minutes.  Disclaimer: This note was dictated with voice recognition software. Similar sounding words can inadvertently be transcribed and may not be corrected upon review.   Timberlyn Pickford K. September 01, 2016, 4:51 PM

## 2016-09-01 NOTE — Patient Instructions (Signed)
Steps to Quit Smoking Smoking tobacco can be bad for your health. It can also affect almost every organ in your body. Smoking puts you and people around you at risk for many serious long-lasting (chronic) diseases. Quitting smoking is hard, but it is one of the best things that you can do for your health. It is never too late to quit. What are the benefits of quitting smoking? When you quit smoking, you lower your risk for getting serious diseases and conditions. They can include:  Lung cancer or lung disease.  Heart disease.  Stroke.  Heart attack.  Not being able to have children (infertility).  Weak bones (osteoporosis) and broken bones (fractures). If you have coughing, wheezing, and shortness of breath, those symptoms may get better when you quit. You may also get sick less often. If you are pregnant, quitting smoking can help to lower your chances of having a baby of low birth weight. What can I do to help me quit smoking? Talk with your doctor about what can help you quit smoking. Some things you can do (strategies) include:  Quitting smoking totally, instead of slowly cutting back how much you smoke over a period of time.  Going to in-person counseling. You are more likely to quit if you go to many counseling sessions.  Using resources and support systems, such as:  Online chats with a counselor.  Phone quitlines.  Printed self-help materials.  Support groups or group counseling.  Text messaging programs.  Mobile phone apps or applications.  Taking medicines. Some of these medicines may have nicotine in them. If you are pregnant or breastfeeding, do not take any medicines to quit smoking unless your doctor says it is okay. Talk with your doctor about counseling or other things that can help you. Talk with your doctor about using more than one strategy at the same time, such as taking medicines while you are also going to in-person counseling. This can help make quitting  easier. What things can I do to make it easier to quit? Quitting smoking might feel very hard at first, but there is a lot that you can do to make it easier. Take these steps:  Talk to your family and friends. Ask them to support and encourage you.  Call phone quitlines, reach out to support groups, or work with a counselor.  Ask people who smoke to not smoke around you.  Avoid places that make you want (trigger) to smoke, such as:  Bars.  Parties.  Smoke-break areas at work.  Spend time with people who do not smoke.  Lower the stress in your life. Stress can make you want to smoke. Try these things to help your stress:  Getting regular exercise.  Deep-breathing exercises.  Yoga.  Meditating.  Doing a body scan. To do this, close your eyes, focus on one area of your body at a time from head to toe, and notice which parts of your body are tense. Try to relax the muscles in those areas.  Download or buy apps on your mobile phone or tablet that can help you stick to your quit plan. There are many free apps, such as QuitGuide from the CDC (Centers for Disease Control and Prevention). You can find more support from smokefree.gov and other websites. This information is not intended to replace advice given to you by your health care provider. Make sure you discuss any questions you have with your health care provider. Document Released: 06/06/2009 Document Revised: 04/07/2016 Document   Reviewed: 12/25/2014 Elsevier Interactive Patient Education  2017 Elsevier Inc.  

## 2016-09-01 NOTE — Addendum Note (Signed)
Encounter addended by: Gery Pray, MD on: 09/01/2016  8:26 AM<BR>    Actions taken: Sign clinical note

## 2016-09-01 NOTE — Progress Notes (Signed)
Oncology Nurse Navigator Documentation  Oncology Nurse Navigator Flowsheets 09/01/2016  Navigator Location [facility]  Navigator Encounter Type Clinic/MDC/per Dr. Worthy Flank request, I contacted central scheduling to change appt to earlier time.  I then updated patient and her family.  I gave them a map of scan location.   Treatment Initiated Date 09/02/2016  Patient Visit Type MedOnc  Treatment Phase Pre-Tx/Tx Discussion  Barriers/Navigation Needs Coordination of Care  Interventions Coordination of Care  Coordination of Care Appts;Radiology  Acuity Level 2  Acuity Level 2 Assistance expediting appointments  Time Spent with Patient 30

## 2016-09-01 NOTE — Telephone Encounter (Signed)
Appointments scheduled per 1/9 LOS. Patient given AVS report and calendars with future scheduled appointments. °

## 2016-09-02 ENCOUNTER — Telehealth: Payer: Self-pay | Admitting: Medical Oncology

## 2016-09-02 ENCOUNTER — Encounter: Payer: Self-pay | Admitting: Internal Medicine

## 2016-09-02 ENCOUNTER — Ambulatory Visit
Admission: RE | Admit: 2016-09-02 | Discharge: 2016-09-02 | Disposition: A | Discharge status: Home / Self Care | Payer: Medicare Other | Source: Ambulatory Visit | Attending: Radiation Oncology | Admitting: Radiation Oncology

## 2016-09-02 DIAGNOSIS — I2510 Atherosclerotic heart disease of native coronary artery without angina pectoris: Secondary | ICD-10-CM | POA: Diagnosis not present

## 2016-09-02 DIAGNOSIS — C3431 Malignant neoplasm of lower lobe, right bronchus or lung: Secondary | ICD-10-CM

## 2016-09-02 DIAGNOSIS — Z79899 Other long term (current) drug therapy: Secondary | ICD-10-CM | POA: Diagnosis not present

## 2016-09-02 DIAGNOSIS — Z888 Allergy status to other drugs, medicaments and biological substances status: Secondary | ICD-10-CM | POA: Diagnosis not present

## 2016-09-02 DIAGNOSIS — Z510 Encounter for antineoplastic radiation therapy: Secondary | ICD-10-CM | POA: Diagnosis not present

## 2016-09-02 DIAGNOSIS — I671 Cerebral aneurysm, nonruptured: Secondary | ICD-10-CM | POA: Diagnosis not present

## 2016-09-02 DIAGNOSIS — Z7982 Long term (current) use of aspirin: Secondary | ICD-10-CM | POA: Diagnosis not present

## 2016-09-02 DIAGNOSIS — Z8673 Personal history of transient ischemic attack (TIA), and cerebral infarction without residual deficits: Secondary | ICD-10-CM | POA: Diagnosis not present

## 2016-09-02 DIAGNOSIS — K219 Gastro-esophageal reflux disease without esophagitis: Secondary | ICD-10-CM | POA: Diagnosis not present

## 2016-09-02 DIAGNOSIS — E039 Hypothyroidism, unspecified: Secondary | ICD-10-CM | POA: Diagnosis not present

## 2016-09-02 DIAGNOSIS — J439 Emphysema, unspecified: Secondary | ICD-10-CM | POA: Diagnosis not present

## 2016-09-02 DIAGNOSIS — Z9889 Other specified postprocedural states: Secondary | ICD-10-CM | POA: Diagnosis not present

## 2016-09-02 DIAGNOSIS — Z85828 Personal history of other malignant neoplasm of skin: Secondary | ICD-10-CM | POA: Diagnosis not present

## 2016-09-02 DIAGNOSIS — R531 Weakness: Secondary | ICD-10-CM | POA: Diagnosis not present

## 2016-09-02 DIAGNOSIS — L719 Rosacea, unspecified: Secondary | ICD-10-CM | POA: Diagnosis not present

## 2016-09-02 DIAGNOSIS — M419 Scoliosis, unspecified: Secondary | ICD-10-CM | POA: Diagnosis not present

## 2016-09-02 NOTE — Progress Notes (Signed)
  Radiation Oncology         [phone] ________________________________  Name: [name] MRN: [id number]  Date: 09/02/2016  DOB: [birth year]  Simulation Verification Note    ICD-9-CM ICD-10-CM   1. Primary cancer of right lower lobe of lung (HCC) 162.5 C34.31     Status: outpatient  NARRATIVE: The patient was brought to the treatment unit and placed in the planned treatment position. The clinical setup was verified. Then port films were obtained and uploaded to the radiation oncology medical record software.  The treatment beams were carefully compared against the planned radiation fields. The position location and shape of the radiation fields was reviewed. They targeted volume of tissue appears to be appropriately covered by the radiation beams. Organs at risk appear to be excluded as planned.  Based on my personal review, I approved the simulation verification. The patient's treatment will proceed as planned.  -----------------------------------  Blair Promise, PhD, MD

## 2016-09-02 NOTE — Telephone Encounter (Signed)
Yvette at [facility] called stating pt walked in to discuss services. [facility] or hospice referral must come from PCP.

## 2016-09-03 ENCOUNTER — Ambulatory Visit
Admission: RE | Admit: 2016-09-03 | Discharge: 2016-09-03 | Disposition: A | Discharge status: Home / Self Care | Payer: Medicare Other | Source: Ambulatory Visit | Attending: Radiation Oncology | Admitting: Radiation Oncology

## 2016-09-03 ENCOUNTER — Encounter: Payer: Medicare Other | Admitting: *Deleted

## 2016-09-03 ENCOUNTER — Ambulatory Visit: Payer: Medicare Other | Admitting: Physical Therapy

## 2016-09-03 ENCOUNTER — Encounter: Payer: Self-pay | Admitting: Radiation Oncology

## 2016-09-03 VITALS — BP 72/43 | HR 90 | Temp 98.0°F

## 2016-09-03 DIAGNOSIS — Z9889 Other specified postprocedural states: Secondary | ICD-10-CM | POA: Diagnosis not present

## 2016-09-03 DIAGNOSIS — C3431 Malignant neoplasm of lower lobe, right bronchus or lung: Secondary | ICD-10-CM

## 2016-09-03 DIAGNOSIS — I2510 Atherosclerotic heart disease of native coronary artery without angina pectoris: Secondary | ICD-10-CM | POA: Diagnosis not present

## 2016-09-03 DIAGNOSIS — R531 Weakness: Secondary | ICD-10-CM | POA: Diagnosis not present

## 2016-09-03 DIAGNOSIS — Z79899 Other long term (current) drug therapy: Secondary | ICD-10-CM | POA: Diagnosis not present

## 2016-09-03 DIAGNOSIS — M419 Scoliosis, unspecified: Secondary | ICD-10-CM | POA: Diagnosis not present

## 2016-09-03 DIAGNOSIS — Z85828 Personal history of other malignant neoplasm of skin: Secondary | ICD-10-CM | POA: Diagnosis not present

## 2016-09-03 DIAGNOSIS — J439 Emphysema, unspecified: Secondary | ICD-10-CM | POA: Diagnosis not present

## 2016-09-03 DIAGNOSIS — Z7982 Long term (current) use of aspirin: Secondary | ICD-10-CM | POA: Diagnosis not present

## 2016-09-03 DIAGNOSIS — I671 Cerebral aneurysm, nonruptured: Secondary | ICD-10-CM | POA: Diagnosis not present

## 2016-09-03 DIAGNOSIS — Z8673 Personal history of transient ischemic attack (TIA), and cerebral infarction without residual deficits: Secondary | ICD-10-CM | POA: Diagnosis not present

## 2016-09-03 DIAGNOSIS — L719 Rosacea, unspecified: Secondary | ICD-10-CM | POA: Diagnosis not present

## 2016-09-03 DIAGNOSIS — E039 Hypothyroidism, unspecified: Secondary | ICD-10-CM | POA: Diagnosis not present

## 2016-09-03 DIAGNOSIS — Z888 Allergy status to other drugs, medicaments and biological substances status: Secondary | ICD-10-CM | POA: Diagnosis not present

## 2016-09-03 DIAGNOSIS — Z510 Encounter for antineoplastic radiation therapy: Secondary | ICD-10-CM | POA: Diagnosis not present

## 2016-09-03 DIAGNOSIS — K219 Gastro-esophageal reflux disease without esophagitis: Secondary | ICD-10-CM | POA: Diagnosis not present

## 2016-09-03 NOTE — Progress Notes (Signed)
[name] has completed 2 fractions to her right lung.  She was walking to the machine today and said her right knee gave out and she started to fall down in the hallway.  She was caught by the therapists before she fell.  She reports feeling dizzy before this happened.  Her bp was checked afterwards and was 81/47 with the regular size cuff and 93/64 with the pediatric size cuff.  Her husband was also told that she is having mini strokes and is not sure if this is what causes her falls.  He reports that she "fall a lot."

## 2016-09-03 NOTE — Progress Notes (Signed)
  Radiation Oncology         (336) [phone] ________________________________  Name: [name] MRN: [id number]  Date: 09/03/2016  DOB: 1946-06-28  Weekly Radiation Therapy Management    ICD-9-CM ICD-10-CM   1. Primary cancer of right lower lobe of lung (HCC) 162.5 C34.31      Current Dose: 5 Gy     Planned Dose:  35 Gy  Narrative . . . . . . . . The patient presents for routine under treatment assessment.  The patient has completed 2 fractions to her Right Lung. She was walking to the machine today and said her right knee gave out and she started to fall down in the hallway. She was caught by the therapists before she fell. She reports feeling dizzy before this happened. Per nursing, her BP was checked afterwards and was 81/47 with the regular size cuff and 93/64 with the pediatric size cuff. Her husband is present today and reports he was also told that she is having mini strokes and is not sure if this is what causes her falls. He reports that she "falls a lot." He also reports her appetite has not been good recently, and he believes she may be dehydrated.                                     The patient is without complaint.                                 Set-up films were reviewed.                                 The chart was checked. Physical Findings. . .  temperature is 98 F (36.7 C). Her blood pressure is 72/43 (abnormal) and her pulse is 90. Her oxygen saturation is 100%.  Weight essentially stable. No significant changes. In wheelchair. Standing BP was taken at the end of the encounter, and found to be 72/43. The lungs are clear. The heart has a regular rhythm and rate. Impression . . . . . . . The patient is tolerating radiation. The patient did not actually fall, but became weak and was held by therapist. Evidence of orthostatic BP changes noted, and IV fluids were offered, however the patient prefers to try and force fluids at home. Plan . . . . . . . . . . . . Continue  treatment as planned.  ________________________________   Blair Promise, PhD, MD  This document serves as a record of services personally performed by Gery Pray, MD. It was created on his behalf by Maryla Morrow, a trained medical scribe. The creation of this record is based on the scribe's personal observations and the provider's statements to them. This document has been checked and approved by the attending provider.

## 2016-09-04 ENCOUNTER — Telehealth: Payer: Self-pay

## 2016-09-04 ENCOUNTER — Ambulatory Visit
Admission: RE | Admit: 2016-09-04 | Discharge: 2016-09-04 | Disposition: A | Discharge status: Home / Self Care | Payer: Medicare Other | Source: Ambulatory Visit | Attending: Radiation Oncology | Admitting: Radiation Oncology

## 2016-09-04 ENCOUNTER — Telehealth: Payer: Self-pay | Admitting: Oncology

## 2016-09-04 ENCOUNTER — Ambulatory Visit (HOSPITAL_COMMUNITY)
Admission: RE | Admit: 2016-09-04 | Discharge: 2016-09-04 | Disposition: A | Discharge status: Home / Self Care | Payer: Medicare Other | Source: Ambulatory Visit | Attending: Radiation Oncology | Admitting: Radiation Oncology

## 2016-09-04 ENCOUNTER — Other Ambulatory Visit: Payer: Self-pay | Admitting: Radiation Oncology

## 2016-09-04 DIAGNOSIS — M5184 Other intervertebral disc disorders, thoracic region: Secondary | ICD-10-CM | POA: Diagnosis not present

## 2016-09-04 DIAGNOSIS — Z85828 Personal history of other malignant neoplasm of skin: Secondary | ICD-10-CM | POA: Diagnosis not present

## 2016-09-04 DIAGNOSIS — E039 Hypothyroidism, unspecified: Secondary | ICD-10-CM | POA: Diagnosis not present

## 2016-09-04 DIAGNOSIS — Z7982 Long term (current) use of aspirin: Secondary | ICD-10-CM | POA: Diagnosis not present

## 2016-09-04 DIAGNOSIS — C3431 Malignant neoplasm of lower lobe, right bronchus or lung: Secondary | ICD-10-CM

## 2016-09-04 DIAGNOSIS — Z9889 Other specified postprocedural states: Secondary | ICD-10-CM | POA: Diagnosis not present

## 2016-09-04 DIAGNOSIS — I671 Cerebral aneurysm, nonruptured: Secondary | ICD-10-CM | POA: Diagnosis not present

## 2016-09-04 DIAGNOSIS — J439 Emphysema, unspecified: Secondary | ICD-10-CM | POA: Diagnosis not present

## 2016-09-04 DIAGNOSIS — R531 Weakness: Secondary | ICD-10-CM | POA: Diagnosis not present

## 2016-09-04 DIAGNOSIS — Z510 Encounter for antineoplastic radiation therapy: Secondary | ICD-10-CM | POA: Diagnosis not present

## 2016-09-04 DIAGNOSIS — I2510 Atherosclerotic heart disease of native coronary artery without angina pectoris: Secondary | ICD-10-CM | POA: Diagnosis not present

## 2016-09-04 DIAGNOSIS — K219 Gastro-esophageal reflux disease without esophagitis: Secondary | ICD-10-CM | POA: Diagnosis not present

## 2016-09-04 DIAGNOSIS — M419 Scoliosis, unspecified: Secondary | ICD-10-CM | POA: Diagnosis not present

## 2016-09-04 DIAGNOSIS — M546 Pain in thoracic spine: Secondary | ICD-10-CM | POA: Diagnosis not present

## 2016-09-04 DIAGNOSIS — Z888 Allergy status to other drugs, medicaments and biological substances status: Secondary | ICD-10-CM | POA: Diagnosis not present

## 2016-09-04 DIAGNOSIS — Z8673 Personal history of transient ischemic attack (TIA), and cerebral infarction without residual deficits: Secondary | ICD-10-CM | POA: Diagnosis not present

## 2016-09-04 DIAGNOSIS — Z79899 Other long term (current) drug therapy: Secondary | ICD-10-CM | POA: Diagnosis not present

## 2016-09-04 DIAGNOSIS — L719 Rosacea, unspecified: Secondary | ICD-10-CM | POA: Diagnosis not present

## 2016-09-04 MED ORDER — GADOBENATE DIMEGLUMINE 529 MG/ML IV SOLN
10.0000 mL | Freq: Once | INTRAVENOUS | Status: AC | PRN
  Administered 2016-09-04: 8 mL via INTRAVENOUS

## 2016-09-04 MED ORDER — DEXAMETHASONE 4 MG PO TABS: 4.0000 mg | ORAL_TABLET | Freq: Two times a day (BID) | ORAL | 0 refills | Status: AC

## 2016-09-04 MED ORDER — BIAFINE EX EMUL
Freq: Once | CUTANEOUS | Status: AC
  Administered 2016-09-04: 11:00:00 via TOPICAL

## 2016-09-04 NOTE — Addendum Note (Signed)
Encounter addended by: Jacqulyn Liner, RN on: 09/04/2016 10:53 AM<BR>    Actions taken: Sign clinical note

## 2016-09-04 NOTE — Telephone Encounter (Signed)
I called and spoke to Mr. Fishel at the request of Dr. Isidore Moos. Ms. Mennen had reported increased pain today when seen in the office. Dr. Isidore Moos wanted to offer to prescribe a steroid to see if it might help her pain. Mr. Imber accepted the offer of the steroid prescription, but mentioned to me that she has had an adverse reaction to prednisone in the past consisting of nausea. Dr. Isidore Moos is aware of this and will prescribe a different type of steroid, and asked me to make sure Ms. Moffa continues to take her protonix to help with nausea. I also informed him that if she did become to nauseas to discontinue the steroid and make sure Dr. Sondra Come was aware of the situation next week when she saw him.

## 2016-09-04 NOTE — Progress Notes (Signed)
Pt here for patient teaching.  Pt given Radiation and You booklet and biafine.  Reviewed areas of pertinence such as fatigue, skin changes and throat changes . Pt able to give teach back of applying biafine to treatment area twice a day and  to pat skin and use unscented/gentle soap,avoid applying anything to skin within 4 hours of treatment. Pt demonstrated understanding and verbalizes understanding of information given and will contact nursing with any questions or concerns.

## 2016-09-04 NOTE — Telephone Encounter (Signed)
Called [name] and let him know that Dr. Isidore Moos recommended that [name] take gabapentin for her mid back pain and to continue her tramadol.  [name] said she just took a gabapentin.  Also advised him that we have a symptom management clinic that is open until 3:30 this afternoon if needed and that he can call the doctor on call if the gabapentin does not help.  [name] verbalized understanding and agreement.

## 2016-09-04 NOTE — Progress Notes (Signed)
I received a call from Dr. 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 Pt reported mid back pain earlier to [name] RN of nursing that is affecting her QOL.  Rx made for decadron '4mg'$  BID to take with protonix and meals.  [name] is calling patient to advise her to stop this if it causes significant nausea, given prior reactions to prednisone.  Note routed to Dr Sondra Come.     -----------------------------------  Eppie Gibson, MD

## 2016-09-04 NOTE — Progress Notes (Signed)
Patient reports she is not dizzy today and is eating and drinking.  She does report pain in her mid back that comes and goes at an 8/10. She is taking gabapentin at night and tramadol 4 times a day.

## 2016-09-07 ENCOUNTER — Ambulatory Visit
Admission: RE | Admit: 2016-09-07 | Discharge: 2016-09-07 | Disposition: A | Discharge status: Home / Self Care | Payer: Medicare Other | Source: Ambulatory Visit | Attending: Radiation Oncology | Admitting: Radiation Oncology

## 2016-09-07 DIAGNOSIS — L719 Rosacea, unspecified: Secondary | ICD-10-CM | POA: Diagnosis not present

## 2016-09-07 DIAGNOSIS — Z79899 Other long term (current) drug therapy: Secondary | ICD-10-CM | POA: Diagnosis not present

## 2016-09-07 DIAGNOSIS — I671 Cerebral aneurysm, nonruptured: Secondary | ICD-10-CM | POA: Diagnosis not present

## 2016-09-07 DIAGNOSIS — Z9889 Other specified postprocedural states: Secondary | ICD-10-CM | POA: Diagnosis not present

## 2016-09-07 DIAGNOSIS — R531 Weakness: Secondary | ICD-10-CM | POA: Diagnosis not present

## 2016-09-07 DIAGNOSIS — Z888 Allergy status to other drugs, medicaments and biological substances status: Secondary | ICD-10-CM | POA: Diagnosis not present

## 2016-09-07 DIAGNOSIS — J439 Emphysema, unspecified: Secondary | ICD-10-CM | POA: Diagnosis not present

## 2016-09-07 DIAGNOSIS — M419 Scoliosis, unspecified: Secondary | ICD-10-CM | POA: Diagnosis not present

## 2016-09-07 DIAGNOSIS — Z8673 Personal history of transient ischemic attack (TIA), and cerebral infarction without residual deficits: Secondary | ICD-10-CM | POA: Diagnosis not present

## 2016-09-07 DIAGNOSIS — E039 Hypothyroidism, unspecified: Secondary | ICD-10-CM | POA: Diagnosis not present

## 2016-09-07 DIAGNOSIS — K219 Gastro-esophageal reflux disease without esophagitis: Secondary | ICD-10-CM | POA: Diagnosis not present

## 2016-09-07 DIAGNOSIS — C3431 Malignant neoplasm of lower lobe, right bronchus or lung: Secondary | ICD-10-CM | POA: Diagnosis not present

## 2016-09-07 DIAGNOSIS — Z85828 Personal history of other malignant neoplasm of skin: Secondary | ICD-10-CM | POA: Diagnosis not present

## 2016-09-07 DIAGNOSIS — I2510 Atherosclerotic heart disease of native coronary artery without angina pectoris: Secondary | ICD-10-CM | POA: Diagnosis not present

## 2016-09-07 DIAGNOSIS — Z7982 Long term (current) use of aspirin: Secondary | ICD-10-CM | POA: Diagnosis not present

## 2016-09-07 DIAGNOSIS — Z510 Encounter for antineoplastic radiation therapy: Secondary | ICD-10-CM | POA: Diagnosis not present

## 2016-09-08 ENCOUNTER — Encounter: Payer: Self-pay | Admitting: Radiation Oncology

## 2016-09-08 ENCOUNTER — Ambulatory Visit: Payer: Medicare Other | Admitting: Physical Therapy

## 2016-09-08 ENCOUNTER — Encounter: Payer: Medicare Other | Admitting: *Deleted

## 2016-09-08 ENCOUNTER — Ambulatory Visit
Admission: RE | Admit: 2016-09-08 | Discharge: 2016-09-08 | Disposition: A | Discharge status: Home / Self Care | Payer: Medicare Other | Source: Ambulatory Visit | Attending: Radiation Oncology | Admitting: Radiation Oncology

## 2016-09-08 VITALS — BP 109/79 | HR 98 | Temp 97.8°F | Ht 62.0 in | Wt 96.2 lb

## 2016-09-08 DIAGNOSIS — Z888 Allergy status to other drugs, medicaments and biological substances status: Secondary | ICD-10-CM | POA: Diagnosis not present

## 2016-09-08 DIAGNOSIS — E039 Hypothyroidism, unspecified: Secondary | ICD-10-CM | POA: Diagnosis not present

## 2016-09-08 DIAGNOSIS — C3431 Malignant neoplasm of lower lobe, right bronchus or lung: Secondary | ICD-10-CM | POA: Diagnosis not present

## 2016-09-08 DIAGNOSIS — Z79899 Other long term (current) drug therapy: Secondary | ICD-10-CM | POA: Diagnosis not present

## 2016-09-08 DIAGNOSIS — Z85828 Personal history of other malignant neoplasm of skin: Secondary | ICD-10-CM | POA: Diagnosis not present

## 2016-09-08 DIAGNOSIS — I671 Cerebral aneurysm, nonruptured: Secondary | ICD-10-CM | POA: Diagnosis not present

## 2016-09-08 DIAGNOSIS — Z510 Encounter for antineoplastic radiation therapy: Secondary | ICD-10-CM | POA: Diagnosis not present

## 2016-09-08 DIAGNOSIS — Z8673 Personal history of transient ischemic attack (TIA), and cerebral infarction without residual deficits: Secondary | ICD-10-CM | POA: Diagnosis not present

## 2016-09-08 DIAGNOSIS — J439 Emphysema, unspecified: Secondary | ICD-10-CM | POA: Diagnosis not present

## 2016-09-08 DIAGNOSIS — M419 Scoliosis, unspecified: Secondary | ICD-10-CM | POA: Diagnosis not present

## 2016-09-08 DIAGNOSIS — K219 Gastro-esophageal reflux disease without esophagitis: Secondary | ICD-10-CM | POA: Diagnosis not present

## 2016-09-08 DIAGNOSIS — L719 Rosacea, unspecified: Secondary | ICD-10-CM | POA: Diagnosis not present

## 2016-09-08 DIAGNOSIS — I2510 Atherosclerotic heart disease of native coronary artery without angina pectoris: Secondary | ICD-10-CM | POA: Diagnosis not present

## 2016-09-08 DIAGNOSIS — Z7982 Long term (current) use of aspirin: Secondary | ICD-10-CM | POA: Diagnosis not present

## 2016-09-08 DIAGNOSIS — R531 Weakness: Secondary | ICD-10-CM | POA: Diagnosis not present

## 2016-09-08 DIAGNOSIS — Z9889 Other specified postprocedural states: Secondary | ICD-10-CM | POA: Diagnosis not present

## 2016-09-08 NOTE — Progress Notes (Signed)
  Radiation Oncology         (336) [phone] ________________________________  Name: [name] MRN: [id number]  Date: 09/08/2016  DOB: [birth year]  Weekly Radiation Therapy Management    ICD-9-CM ICD-10-CM   1. Primary cancer of right lower lobe of lung (HCC) 162.5 C34.31      Current Dose: 12.5 Gy     Planned Dose:  35 Gy  Narrative . . . . . . . . The patient presents for routine under treatment assessment.    [name] has completed 5 fractions to her right lung. She reports having pain in her right side/abdomen as a 6/10.  She is taking tramadol 4 times a day and 2 tablets of gabapentin at bedtime.  She started taking decadron on Friday twice a day. She reports it is keeping her up at night. She takes one pill in the morning and another at [facility] She denies having shortness of breath.  She reports having a dry cough.  She denies having trouble swallowing or a sore throat.  She reports she is eating more. She denies having any skin irritation on her chest or back.                                  Set-up films were reviewed.                                 The chart was checked. Physical Findings. . .  height is '5\' 2"'$  (1.575 m) and weight is 96 lb 3.2 oz (43.6 kg). Her oral temperature is 97.8 F (36.6 C). Her blood pressure is 109/79 and her pulse is 98. Her oxygen saturation is 100%.   Orthostatic vitals taken: bp sitting 128/91, hr 97, bp standing 109/79, hr 98. Lungs are clear to auscultation bilaterally. Heart has regular rate and rhythm. Oropharynx clear. Impression . . . . . . . The patient is tolerating radiation. Plan . . . . . . . . . . . . Continue treatment as planned. I advised the patient to taker her second pill of Decadron at 4PM instead of 8PM to not interfere with her sleep. We discussed that Decadron could increase the risk of yeast infection. She is to notify us if food tastes differently or she notices while spots on her  tongue. ________________________________   Blair Promise, PhD, MD  This document serves as a record of services personally performed by Gery Pray, MD. It was created on his behalf by Darcus Austin, a trained medical scribe. The creation of this record is based on the scribe's personal observations and the provider's statements to them. This document has been checked and approved by the attending provider.

## 2016-09-08 NOTE — Progress Notes (Signed)
[name] has completed 5 fractions to her right lung.  She reports having pain in her right side/abdomen at a 6/10.  She is taking tramadol 4 times a day and 2 tablets of gabapentin at bedtime.  She started taking decadron on Friday twice a day.  She reports it is keeping her up at night.  She denies having shortness of breath.  She reports having a dry cough.  She denies having trouble swallowing or a sore throat.  She reports she is eating more.  Orthostatic vitals taken: bp sitting 128/91, hr 97, bp standing 109/79, hr 98.  She denies having any skin irritation on her chest or back.  BP 109/79 (BP Location: Left Arm, Patient Position: Standing)   Pulse 98   Temp 97.8 F (36.6 C) (Oral)   Ht '5\' 2"'$  (1.575 m)   Wt 96 lb 3.2 oz (43.6 kg)   SpO2 100%   BMI 17.60 kg/m    Wt Readings from Last 3 Encounters:  09/08/16 96 lb 3.2 oz (43.6 kg)  09/01/16 92 lb 9.6 oz (42 kg)  08/28/16 93 lb 12.8 oz (42.5 kg)

## 2016-09-09 ENCOUNTER — Encounter (HOSPITAL_COMMUNITY): Payer: Self-pay

## 2016-09-09 ENCOUNTER — Ambulatory Visit: Payer: Medicare Other

## 2016-09-10 ENCOUNTER — Ambulatory Visit
Admission: RE | Admit: 2016-09-10 | Discharge: 2016-09-10 | Disposition: A | Discharge status: Home / Self Care | Payer: Medicare Other | Source: Ambulatory Visit | Attending: Radiation Oncology | Admitting: Radiation Oncology

## 2016-09-10 ENCOUNTER — Encounter: Payer: Medicare Other | Admitting: *Deleted

## 2016-09-10 ENCOUNTER — Ambulatory Visit: Payer: Medicare Other | Admitting: Physical Therapy

## 2016-09-10 DIAGNOSIS — E039 Hypothyroidism, unspecified: Secondary | ICD-10-CM | POA: Diagnosis not present

## 2016-09-10 DIAGNOSIS — J439 Emphysema, unspecified: Secondary | ICD-10-CM | POA: Diagnosis not present

## 2016-09-10 DIAGNOSIS — Z9889 Other specified postprocedural states: Secondary | ICD-10-CM | POA: Diagnosis not present

## 2016-09-10 DIAGNOSIS — Z79899 Other long term (current) drug therapy: Secondary | ICD-10-CM | POA: Diagnosis not present

## 2016-09-10 DIAGNOSIS — R531 Weakness: Secondary | ICD-10-CM | POA: Diagnosis not present

## 2016-09-10 DIAGNOSIS — Z7982 Long term (current) use of aspirin: Secondary | ICD-10-CM | POA: Diagnosis not present

## 2016-09-10 DIAGNOSIS — M419 Scoliosis, unspecified: Secondary | ICD-10-CM | POA: Diagnosis not present

## 2016-09-10 DIAGNOSIS — I671 Cerebral aneurysm, nonruptured: Secondary | ICD-10-CM | POA: Diagnosis not present

## 2016-09-10 DIAGNOSIS — Z8673 Personal history of transient ischemic attack (TIA), and cerebral infarction without residual deficits: Secondary | ICD-10-CM | POA: Diagnosis not present

## 2016-09-10 DIAGNOSIS — C3431 Malignant neoplasm of lower lobe, right bronchus or lung: Secondary | ICD-10-CM | POA: Diagnosis not present

## 2016-09-10 DIAGNOSIS — Z85828 Personal history of other malignant neoplasm of skin: Secondary | ICD-10-CM | POA: Diagnosis not present

## 2016-09-10 DIAGNOSIS — Z888 Allergy status to other drugs, medicaments and biological substances status: Secondary | ICD-10-CM | POA: Diagnosis not present

## 2016-09-10 DIAGNOSIS — I2510 Atherosclerotic heart disease of native coronary artery without angina pectoris: Secondary | ICD-10-CM | POA: Diagnosis not present

## 2016-09-10 DIAGNOSIS — K219 Gastro-esophageal reflux disease without esophagitis: Secondary | ICD-10-CM | POA: Diagnosis not present

## 2016-09-10 DIAGNOSIS — L719 Rosacea, unspecified: Secondary | ICD-10-CM | POA: Diagnosis not present

## 2016-09-10 DIAGNOSIS — Z510 Encounter for antineoplastic radiation therapy: Secondary | ICD-10-CM | POA: Diagnosis not present

## 2016-09-11 ENCOUNTER — Other Ambulatory Visit: Payer: Self-pay | Admitting: Radiation Oncology

## 2016-09-11 ENCOUNTER — Other Ambulatory Visit: Payer: Self-pay | Admitting: Internal Medicine

## 2016-09-11 ENCOUNTER — Ambulatory Visit
Admission: RE | Admit: 2016-09-11 | Discharge: 2016-09-11 | Disposition: A | Discharge status: Home / Self Care | Payer: Medicare Other | Source: Ambulatory Visit | Attending: Radiation Oncology | Admitting: Radiation Oncology

## 2016-09-11 ENCOUNTER — Encounter: Payer: Self-pay | Admitting: *Deleted

## 2016-09-11 DIAGNOSIS — C3431 Malignant neoplasm of lower lobe, right bronchus or lung: Secondary | ICD-10-CM | POA: Diagnosis not present

## 2016-09-11 DIAGNOSIS — J439 Emphysema, unspecified: Secondary | ICD-10-CM | POA: Diagnosis not present

## 2016-09-11 DIAGNOSIS — I671 Cerebral aneurysm, nonruptured: Secondary | ICD-10-CM | POA: Diagnosis not present

## 2016-09-11 DIAGNOSIS — Z888 Allergy status to other drugs, medicaments and biological substances status: Secondary | ICD-10-CM | POA: Diagnosis not present

## 2016-09-11 DIAGNOSIS — Z7982 Long term (current) use of aspirin: Secondary | ICD-10-CM | POA: Diagnosis not present

## 2016-09-11 DIAGNOSIS — Z85828 Personal history of other malignant neoplasm of skin: Secondary | ICD-10-CM | POA: Diagnosis not present

## 2016-09-11 DIAGNOSIS — L719 Rosacea, unspecified: Secondary | ICD-10-CM | POA: Diagnosis not present

## 2016-09-11 DIAGNOSIS — R531 Weakness: Secondary | ICD-10-CM | POA: Diagnosis not present

## 2016-09-11 DIAGNOSIS — I2510 Atherosclerotic heart disease of native coronary artery without angina pectoris: Secondary | ICD-10-CM | POA: Diagnosis not present

## 2016-09-11 DIAGNOSIS — M419 Scoliosis, unspecified: Secondary | ICD-10-CM | POA: Diagnosis not present

## 2016-09-11 DIAGNOSIS — Z9889 Other specified postprocedural states: Secondary | ICD-10-CM | POA: Diagnosis not present

## 2016-09-11 DIAGNOSIS — K219 Gastro-esophageal reflux disease without esophagitis: Secondary | ICD-10-CM | POA: Diagnosis not present

## 2016-09-11 DIAGNOSIS — Z510 Encounter for antineoplastic radiation therapy: Secondary | ICD-10-CM | POA: Diagnosis not present

## 2016-09-11 DIAGNOSIS — Z79899 Other long term (current) drug therapy: Secondary | ICD-10-CM | POA: Diagnosis not present

## 2016-09-11 DIAGNOSIS — E039 Hypothyroidism, unspecified: Secondary | ICD-10-CM | POA: Diagnosis not present

## 2016-09-11 DIAGNOSIS — Z8673 Personal history of transient ischemic attack (TIA), and cerebral infarction without residual deficits: Secondary | ICD-10-CM | POA: Diagnosis not present

## 2016-09-11 MED ORDER — TEMAZEPAM 15 MG PO CAPS: 15.0000 mg | ORAL_CAPSULE | Freq: Every evening | ORAL | 0 refills | Status: AC | PRN

## 2016-09-11 NOTE — Progress Notes (Signed)
Mrs. [name] came by after her radiation to mention that she is not sleeping at night since starting her Decadron feels anxious and unable to settle down to sleep.  Taking her Tylenol PM and it is not helping.  She decreased her Decadron 4 mg. down to one pill a day on 09-06-16 and she is still  anxious.  Dr. Isidore Moos made aware of the situation and ordered Restoril for sleep and asked her to continue to take the Decadron 4 mg daily in the morning. Dr. Sondra Come will re-evaluate her on Tuesday.  Patient and husband understood the plan and were able to verbalize back the instructions given today.

## 2016-09-11 NOTE — Addendum Note (Signed)
Encounter addended by: Malena Edman, RN on: 09/11/2016 11:09 AM<BR>    Actions taken: Order Reconciliation Section accessed

## 2016-09-11 NOTE — Progress Notes (Signed)
Oncology Nurse Navigator Documentation  Oncology Nurse Navigator Flowsheets 09/11/2016  Navigator Location [facility]  Navigator Encounter Type Other/I followed up on molecular test results.  I printed and place on Dr. Worthy Flank desk.   Treatment Phase Pre-Tx/Tx Discussion  Barriers/Navigation Needs Coordination of Care  Interventions Coordination of Care  Coordination of Care Other  Acuity Level 1  Time Spent with Patient 30

## 2016-09-11 NOTE — Progress Notes (Signed)
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  I wrote a temporary Rx for Restoril.  Patient can check in with Dr Sondra Come next week to verify if it is helping.  -----------------------------------  Eppie Gibson, MD

## 2016-09-14 ENCOUNTER — Telehealth: Payer: Self-pay | Admitting: Oncology

## 2016-09-14 ENCOUNTER — Other Ambulatory Visit: Payer: Self-pay | Admitting: Radiation Oncology

## 2016-09-14 ENCOUNTER — Ambulatory Visit (HOSPITAL_COMMUNITY): Admission: RE | Admit: 2016-09-14 | Payer: Medicare Other | Source: Ambulatory Visit

## 2016-09-14 ENCOUNTER — Ambulatory Visit (HOSPITAL_BASED_OUTPATIENT_CLINIC_OR_DEPARTMENT_OTHER)
Admission: RE | Admit: 2016-09-14 | Discharge: 2016-09-14 | Disposition: A | Discharge status: Home / Self Care | Payer: Medicare Other | Source: Ambulatory Visit | Attending: Radiation Oncology | Admitting: Radiation Oncology

## 2016-09-14 ENCOUNTER — Other Ambulatory Visit (HOSPITAL_COMMUNITY): Payer: Medicare Other

## 2016-09-14 DIAGNOSIS — Z79899 Other long term (current) drug therapy: Secondary | ICD-10-CM | POA: Diagnosis not present

## 2016-09-14 DIAGNOSIS — Z8673 Personal history of transient ischemic attack (TIA), and cerebral infarction without residual deficits: Secondary | ICD-10-CM | POA: Diagnosis not present

## 2016-09-14 DIAGNOSIS — Z510 Encounter for antineoplastic radiation therapy: Secondary | ICD-10-CM | POA: Diagnosis not present

## 2016-09-14 DIAGNOSIS — J439 Emphysema, unspecified: Secondary | ICD-10-CM | POA: Diagnosis not present

## 2016-09-14 DIAGNOSIS — Z85828 Personal history of other malignant neoplasm of skin: Secondary | ICD-10-CM | POA: Diagnosis not present

## 2016-09-14 DIAGNOSIS — C3431 Malignant neoplasm of lower lobe, right bronchus or lung: Secondary | ICD-10-CM

## 2016-09-14 DIAGNOSIS — Z9889 Other specified postprocedural states: Secondary | ICD-10-CM | POA: Diagnosis not present

## 2016-09-14 DIAGNOSIS — E039 Hypothyroidism, unspecified: Secondary | ICD-10-CM | POA: Diagnosis not present

## 2016-09-14 DIAGNOSIS — I2510 Atherosclerotic heart disease of native coronary artery without angina pectoris: Secondary | ICD-10-CM | POA: Diagnosis not present

## 2016-09-14 DIAGNOSIS — M419 Scoliosis, unspecified: Secondary | ICD-10-CM | POA: Diagnosis not present

## 2016-09-14 DIAGNOSIS — K219 Gastro-esophageal reflux disease without esophagitis: Secondary | ICD-10-CM | POA: Diagnosis not present

## 2016-09-14 DIAGNOSIS — Z7982 Long term (current) use of aspirin: Secondary | ICD-10-CM | POA: Diagnosis not present

## 2016-09-14 DIAGNOSIS — Z888 Allergy status to other drugs, medicaments and biological substances status: Secondary | ICD-10-CM | POA: Diagnosis not present

## 2016-09-14 DIAGNOSIS — L719 Rosacea, unspecified: Secondary | ICD-10-CM | POA: Diagnosis not present

## 2016-09-14 DIAGNOSIS — I671 Cerebral aneurysm, nonruptured: Secondary | ICD-10-CM | POA: Diagnosis not present

## 2016-09-14 DIAGNOSIS — R531 Weakness: Secondary | ICD-10-CM | POA: Diagnosis not present

## 2016-09-14 MED ORDER — TRAMADOL HCL 50 MG PO TABS: 50.0000 mg | ORAL_TABLET | Freq: Four times a day (QID) | ORAL | 0 refills | Status: AC | PRN

## 2016-09-14 NOTE — Progress Notes (Signed)
Patient requested refills on Klor-con and tramadol.  She said she is having more pain and is wondering about increasing her pain medication.  Dr. Sondra Come notified.

## 2016-09-14 NOTE — Telephone Encounter (Signed)
Called [name] and spoke to her husband.  Advised him that the refill for tramadol is available for pick up in the radiation nursing area.  Advised him that [name] can take 2 tablets tonight per Dr. Sondra Come for increased pain.  Also advised him that [name] will need to go to lab at 10:15 before treatment tomorrow to check her potassium level.  He verbalized understanding and agreement.

## 2016-09-15 ENCOUNTER — Encounter: Payer: Self-pay | Admitting: Physical Therapy

## 2016-09-15 ENCOUNTER — Ambulatory Visit
Admission: RE | Admit: 2016-09-15 | Discharge: 2016-09-15 | Disposition: A | Discharge status: Home / Self Care | Payer: Medicare Other | Source: Ambulatory Visit | Attending: Radiation Oncology | Admitting: Radiation Oncology

## 2016-09-15 ENCOUNTER — Encounter: Payer: Medicare Other | Admitting: *Deleted

## 2016-09-15 ENCOUNTER — Other Ambulatory Visit: Payer: Self-pay | Admitting: *Deleted

## 2016-09-15 ENCOUNTER — Encounter (HOSPITAL_COMMUNITY): Payer: Self-pay

## 2016-09-15 ENCOUNTER — Institutional Professional Consult (permissible substitution): Payer: Medicare Other | Admitting: Internal Medicine

## 2016-09-15 ENCOUNTER — Ambulatory Visit: Payer: Medicare Other | Admitting: Physical Therapy

## 2016-09-15 ENCOUNTER — Encounter: Payer: Self-pay | Admitting: Radiation Oncology

## 2016-09-15 VITALS — BP 130/81 | HR 105 | Temp 97.9°F | Ht 62.0 in | Wt 95.6 lb

## 2016-09-15 DIAGNOSIS — Z79899 Other long term (current) drug therapy: Secondary | ICD-10-CM | POA: Diagnosis not present

## 2016-09-15 DIAGNOSIS — K219 Gastro-esophageal reflux disease without esophagitis: Secondary | ICD-10-CM | POA: Diagnosis not present

## 2016-09-15 DIAGNOSIS — E039 Hypothyroidism, unspecified: Secondary | ICD-10-CM | POA: Diagnosis not present

## 2016-09-15 DIAGNOSIS — C3431 Malignant neoplasm of lower lobe, right bronchus or lung: Secondary | ICD-10-CM | POA: Diagnosis not present

## 2016-09-15 DIAGNOSIS — J439 Emphysema, unspecified: Secondary | ICD-10-CM | POA: Diagnosis not present

## 2016-09-15 DIAGNOSIS — I2510 Atherosclerotic heart disease of native coronary artery without angina pectoris: Secondary | ICD-10-CM | POA: Diagnosis not present

## 2016-09-15 DIAGNOSIS — Z7982 Long term (current) use of aspirin: Secondary | ICD-10-CM | POA: Diagnosis not present

## 2016-09-15 DIAGNOSIS — R531 Weakness: Secondary | ICD-10-CM | POA: Diagnosis not present

## 2016-09-15 DIAGNOSIS — M419 Scoliosis, unspecified: Secondary | ICD-10-CM | POA: Diagnosis not present

## 2016-09-15 DIAGNOSIS — Z510 Encounter for antineoplastic radiation therapy: Secondary | ICD-10-CM | POA: Diagnosis not present

## 2016-09-15 DIAGNOSIS — I671 Cerebral aneurysm, nonruptured: Secondary | ICD-10-CM | POA: Diagnosis not present

## 2016-09-15 DIAGNOSIS — Z8673 Personal history of transient ischemic attack (TIA), and cerebral infarction without residual deficits: Secondary | ICD-10-CM | POA: Diagnosis not present

## 2016-09-15 DIAGNOSIS — Z9889 Other specified postprocedural states: Secondary | ICD-10-CM | POA: Diagnosis not present

## 2016-09-15 DIAGNOSIS — Z85828 Personal history of other malignant neoplasm of skin: Secondary | ICD-10-CM | POA: Diagnosis not present

## 2016-09-15 DIAGNOSIS — Z888 Allergy status to other drugs, medicaments and biological substances status: Secondary | ICD-10-CM | POA: Diagnosis not present

## 2016-09-15 DIAGNOSIS — L719 Rosacea, unspecified: Secondary | ICD-10-CM | POA: Diagnosis not present

## 2016-09-15 LAB — CBC WITH DIFFERENTIAL/PLATELET
BASO%: 0.3 % (ref 0.0–2.0)
BASOS ABS: 0 10*3/uL (ref 0.0–0.1)
EOS ABS: 0.2 10*3/uL (ref 0.0–0.5)
EOS%: 1.8 % (ref 0.0–7.0)
HCT: 27 % — ABNORMAL LOW (ref 34.8–46.6)
HGB: 9 g/dL — ABNORMAL LOW (ref 11.6–15.9)
LYMPH%: 2.9 % — ABNORMAL LOW (ref 14.0–49.7)
MCH: 30.4 pg (ref 25.1–34.0)
MCHC: 33.2 g/dL (ref 31.5–36.0)
MCV: 91.5 fL (ref 79.5–101.0)
MONO#: 0.7 10*3/uL (ref 0.1–0.9)
MONO%: 6.5 % (ref 0.0–14.0)
NEUT#: 9.5 10*3/uL — ABNORMAL HIGH (ref 1.5–6.5)
NEUT%: 88.5 % — ABNORMAL HIGH (ref 38.4–76.8)
Platelets: 459 10*3/uL — ABNORMAL HIGH (ref 145–400)
RBC: 2.96 10*6/uL — AB (ref 3.70–5.45)
RDW: 14.6 % — ABNORMAL HIGH (ref 11.2–14.5)
WBC: 10.7 10*3/uL — ABNORMAL HIGH (ref 3.9–10.3)
lymph#: 0.3 10*3/uL — ABNORMAL LOW (ref 0.9–3.3)

## 2016-09-15 LAB — COMPREHENSIVE METABOLIC PANEL
Albumin: 2.7 g/dL — ABNORMAL LOW (ref 3.5–5.0)
Alkaline Phosphatase: 93 U/L (ref 40–150)
Anion Gap: 9 mEq/L (ref 3–11)
BUN: 13.5 mg/dL (ref 7.0–26.0)
CALCIUM: 9.1 mg/dL (ref 8.4–10.4)
CHLORIDE: 105 meq/L (ref 98–109)
CO2: 20 meq/L — AB (ref 22–29)
CREATININE: 0.9 mg/dL (ref 0.6–1.1)
EGFR: 69 mL/min/{1.73_m2} — ABNORMAL LOW (ref >90)
GLUCOSE: 91 mg/dL (ref 70–140)
Potassium: 3.5 mEq/L (ref 3.5–5.1)
Sodium: 135 mEq/L — ABNORMAL LOW (ref 136–145)
Total Bilirubin: <0.22 mg/dL (ref 0.20–1.20)
Total Protein: 6.2 g/dL — ABNORMAL LOW (ref 6.4–8.3)

## 2016-09-15 NOTE — Progress Notes (Signed)
  Radiation Oncology         [phone] ________________________________  Name: [name] MRN: [id number]  Date: 09/15/2016  DOB: Jan 02, 1946   Weekly Radiation Therapy Management    ICD-9-CM ICD-10-CM   1. 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 . . . . . . . The patient presents for routine under treatment assessment.    [name] has completed 9 fractions to her right lung. She reports her mid-back pain as a 3/10 that is better today after taking 2 tramadol last night. Her pain is worse during the day. She canceled her PET scan yesterday. She denies having shortness of breath, hemoptysis, or difficulty swallowing. She reports having a dry cough. She reports having fatigue in the afternoons. The nurse notes the skin on her mid back is red.  She is using Biafine. She has labs drawn today to see if she needs to refill potassium, K=3.5 today. The patient is wondering if she could have tomorrow off for a trip to see family in [place], [place].                                  Set-up films were reviewed.                                 The chart was checked. Physical Findings. . .  height is '5\' 2"'$  (1.575 m) and weight is 95 lb 9.6 oz (43.4 kg). Her oral temperature is 97.9 F (36.6 C). Her blood pressure is 130/81 and her pulse is 105 (abnormal). Her oxygen saturation is 99%.   Lungs are clear to auscultation bilaterally. Heart has regular rate and rhythm. Oropharynx clear. Impression . . . . . . . The patient is tolerating radiation. Plan . . . . . . . . . . . . She can have tomorrow off to visit family and will continue treatment normally on Thursday. ________________________________   Blair Promise, PhD, MD  This document serves as a record of services personally performed by Gery Pray, MD. It was created on his behalf by Darcus Austin, a trained medical scribe. The creation of this record  is based on the scribe's personal observations and the provider's statements to them. This document has been checked and approved by the attending provider.

## 2016-09-15 NOTE — Therapy (Signed)
[facility] [address] [place], [place], [address] Phone: [phone]   Fax:  [phone]  Patient Details  Name: [name] MRN: [id number] Date of Birth: 1946/05/29 Referring Provider:  Kathyrn Sheriff  Encounter Date: 09/15/2016  PHYSICAL THERAPY DISCHARGE SUMMARY  Visits from Start of Care: 5  Current functional level related to goals / functional outcomes:     PT Long Term Goals - 08/27/16 1204      PT LONG TERM GOAL #1   Title Patient will demonstrate decreased fall risk with Merrilee Jansky score 52/56 (Target Date 10/02/2016)   Time 8   Period Weeks   Status On-going     PT LONG TERM GOAL #2   Title Patient will ambulate level indoor surfaces 300' with no balance losses with supervision for cognitive deficits. (Target Date 10/02/2016)   Time 8   Period Weeks   Status Revised     PT LONG TERM GOAL #3   Title Patient will demonstrate decreased fall risk with TUG cognitive in 15 seconds or less. (Target Date 10/02/2016)   Time 8   Period Weeks   Status Revised     PT LONG TERM GOAL #4   Title Patient & husband will demonstrate fall recovery & verbalize safe techniques for injury prevention. (Target Date 10/02/2016)   Time 8   Period Weeks   Status Revised     PT LONG TERM GOAL #5   Title Patient will negotiate 4 stairs with single rail for safe entrance to her home with no balance issues & supervision for cognitive issues. (Target Date 10/02/2016)   Time 8   Period Weeks   Status Revised     Additional Long Term Goals   Additional Long Term Goals Yes     PT LONG TERM GOAL #6   Title Functional Gait Assessment >/= 19/30 to indicate lower fall risk. (Target Date 10/02/2016)   Time 1   Period Months   Status New       Remaining deficits: Patient was diagnosed with lung cancer and began oncology care. Pt & her husband requested discharge from therapies to manage oncology for now.    Education / Equipment: HEP   Plan: Patient agrees to discharge.  Patient goals were not met. Patient is being discharged due to a change in medical status.  ?????        Blakely Maranan PT, DPT 09/15/2016, 9:14 AM  [facility] [address] [place], [place], [address] Phone: [phone]   Fax:  [phone]

## 2016-09-15 NOTE — Progress Notes (Signed)
[name] has completed 9 fractions to her right lung.  She reports her pain is better today after taking 2 tramadol last night.  She is rating it at a 3/10 in her mid back today.  She said she canceled her pet scan yesterday.  She denies having shortness of breath.  She reports having a dry cough.  She denies having hemoptysis.  She reports having fatigue in the afternoons.  The skin on her mid back is red.  She is using Biafine. She has labs drawn today to see if she needs to refill potassium.  BP 130/81 (BP Location: Right Arm, Patient Position: Sitting)   Pulse (!) 105   Temp 97.9 F (36.6 C) (Oral)   Ht '5\' 2"'$  (1.575 m)   Wt 95 lb 9.6 oz (43.4 kg)   SpO2 99%   BMI 17.49 kg/m    Wt Readings from Last 3 Encounters:  09/15/16 95 lb 9.6 oz (43.4 kg)  09/08/16 96 lb 3.2 oz (43.6 kg)  09/01/16 92 lb 9.6 oz (42 kg)

## 2016-09-15 NOTE — Telephone Encounter (Signed)
Sent to the pharmacy by e-scribe. 

## 2016-09-16 ENCOUNTER — Ambulatory Visit: Payer: Medicare Other

## 2016-09-17 ENCOUNTER — Encounter: Payer: Self-pay | Admitting: Occupational Therapy

## 2016-09-17 ENCOUNTER — Encounter: Payer: Medicare Other | Admitting: *Deleted

## 2016-09-17 ENCOUNTER — Ambulatory Visit (HOSPITAL_BASED_OUTPATIENT_CLINIC_OR_DEPARTMENT_OTHER): Payer: Medicare Other | Admitting: Internal Medicine

## 2016-09-17 ENCOUNTER — Encounter: Payer: Self-pay | Admitting: Internal Medicine

## 2016-09-17 ENCOUNTER — Ambulatory Visit
Admission: RE | Admit: 2016-09-17 | Discharge: 2016-09-17 | Disposition: A | Discharge status: Home / Self Care | Payer: Medicare Other | Source: Ambulatory Visit | Attending: Radiation Oncology | Admitting: Radiation Oncology

## 2016-09-17 ENCOUNTER — Ambulatory Visit: Payer: Medicare Other | Admitting: Physical Therapy

## 2016-09-17 ENCOUNTER — Other Ambulatory Visit: Payer: Medicare Other

## 2016-09-17 VITALS — BP 118/70 | HR 102 | Temp 97.7°F | Resp 17 | Ht 62.0 in | Wt 97.4 lb

## 2016-09-17 DIAGNOSIS — Z510 Encounter for antineoplastic radiation therapy: Secondary | ICD-10-CM | POA: Diagnosis not present

## 2016-09-17 DIAGNOSIS — I671 Cerebral aneurysm, nonruptured: Secondary | ICD-10-CM | POA: Diagnosis not present

## 2016-09-17 DIAGNOSIS — Z888 Allergy status to other drugs, medicaments and biological substances status: Secondary | ICD-10-CM | POA: Diagnosis not present

## 2016-09-17 DIAGNOSIS — E039 Hypothyroidism, unspecified: Secondary | ICD-10-CM | POA: Diagnosis not present

## 2016-09-17 DIAGNOSIS — M419 Scoliosis, unspecified: Secondary | ICD-10-CM | POA: Diagnosis not present

## 2016-09-17 DIAGNOSIS — I2510 Atherosclerotic heart disease of native coronary artery without angina pectoris: Secondary | ICD-10-CM | POA: Diagnosis not present

## 2016-09-17 DIAGNOSIS — R531 Weakness: Secondary | ICD-10-CM | POA: Diagnosis not present

## 2016-09-17 DIAGNOSIS — M546 Pain in thoracic spine: Secondary | ICD-10-CM

## 2016-09-17 DIAGNOSIS — Z9889 Other specified postprocedural states: Secondary | ICD-10-CM | POA: Diagnosis not present

## 2016-09-17 DIAGNOSIS — C3431 Malignant neoplasm of lower lobe, right bronchus or lung: Secondary | ICD-10-CM

## 2016-09-17 DIAGNOSIS — R918 Other nonspecific abnormal finding of lung field: Secondary | ICD-10-CM

## 2016-09-17 DIAGNOSIS — Z5111 Encounter for antineoplastic chemotherapy: Secondary | ICD-10-CM

## 2016-09-17 DIAGNOSIS — L719 Rosacea, unspecified: Secondary | ICD-10-CM | POA: Diagnosis not present

## 2016-09-17 DIAGNOSIS — J439 Emphysema, unspecified: Secondary | ICD-10-CM | POA: Diagnosis not present

## 2016-09-17 DIAGNOSIS — Z79899 Other long term (current) drug therapy: Secondary | ICD-10-CM | POA: Diagnosis not present

## 2016-09-17 DIAGNOSIS — Z7982 Long term (current) use of aspirin: Secondary | ICD-10-CM | POA: Diagnosis not present

## 2016-09-17 DIAGNOSIS — Z7189 Other specified counseling: Secondary | ICD-10-CM

## 2016-09-17 DIAGNOSIS — Z8673 Personal history of transient ischemic attack (TIA), and cerebral infarction without residual deficits: Secondary | ICD-10-CM | POA: Diagnosis not present

## 2016-09-17 DIAGNOSIS — Z85828 Personal history of other malignant neoplasm of skin: Secondary | ICD-10-CM | POA: Diagnosis not present

## 2016-09-17 DIAGNOSIS — K219 Gastro-esophageal reflux disease without esophagitis: Secondary | ICD-10-CM | POA: Diagnosis not present

## 2016-09-17 NOTE — Progress Notes (Signed)
[facility] [phone]   [phone]  OFFICE PROGRESS NOTE  [name], MD [place] [address]  DIAGNOSIS:  Stage IV (T3, N0, M1 B) non-small cell lung cancer, adenocarcinoma presented with large right lower lobe lung mass with invasion of the thoracic vertebra as well as questionable right upper lobe lung nodule diagnosed in December 2017. PDL 1 expression  Is 10%.  Genomic Alterations Identified? PIK3CA E545K KRAS W43X VQM0Q splice site 6761-9J>K, D326Z TIW58 splice site [id number]>A Additional Findings? Microsatellite status MS-Stable Tumor Mutation Burden TMB-Intermediate; 6 Muts/Mb Additional Disease-relevant Genes with No Reportable Alterations Identified? EGFR ALK BRAF MET RET ERBB2 ROS1  PRIOR THERAPY: palliative radiation to the right lower lobe lung mass with the vertebral invasion.  CURRENT THERAPY: None.  INTERVAL HISTORY: [name] 71 y.o. female returns to the clinic today for follow-up visit accompanied by her son. Her 2 daughters were online  by phone. The patient completed a course of palliative radiotherapy to the right lower lobe lung mass with vertebral invasion under the care of Dr. Sondra Come. She tolerated her treatment well with no significant adverse effects. She denied having any current chest pain, shortness of breath, cough or hemoptysis. She has no fever or chills. She denied having any nausea, vomiting, diarrhea or constipation. She gained around 5 pounds since her last visit. The patient denied having any headache or visual changes. She had molecular studies by [facility] one and the results showed no actionable mutations. PDL 1 expression was 10%. She is here today for evaluation and discussion of her treatment options.  MEDICAL HISTORY: Past Medical History:  Diagnosis Date  . Anemia   . Anxiety   . Back pain, thoracic 09/01/2016  . Basal cell carcinoma of skin    "cut and  burned off; nothing in years" (08/20/2016)  . Depression   . Emphysema of lung ([facility] [facility]) dx'd 08/19/2016  . Encounter for smoking cessation counseling 09/01/2016  . GERD (gastroesophageal reflux disease)    WOULD USE OTC MEDICATION IF NEEDED  . Headache    "probably monthly" (08/20/2016)  . Hypothyroidism   . Lung mass    "bx scheduled in am" (08/20/2016)  . 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 Pneumonia ~ 2010  . Rosacea   . Stroke ([place]) 08/19/2016   "right arm weaker speech issues; right face droops" (08/20/2016)  . Tachycardia    PT STATES HER HEART RATE USUALLY ABOVE 100  . TIA (transient ischemic attack)    "hx several small strokes in the past year" (08/20/2016)    ALLERGIES:  is allergic to plavix [clopidogrel] and prednisone.  MEDICATIONS:  Current Outpatient Prescriptions  Medication Sig Dispense Refill  . amitriptyline (ELAVIL) 100 MG tablet TAKE 3 TABLETS BY MOUTH EVERY DAY (Patient taking differently: Take 300 mg by mouth at bedtime) 270 tablet 1  . aspirin (ASPIRIN CHILDRENS) 81 MG chewable tablet Chew 1 tablet (81 mg total) by mouth daily.    Marland Kitchen dexamethasone (DECADRON) 4 MG tablet Take 1 tablet (4 mg total) by mouth 2 (two) times daily with a meal. Continue Protonix.  Stop if this causes significant nausea. 40 tablet 0  . diphenhydramine-acetaminophen (TYLENOL PM) 25-500 MG TABS tablet Take 1 tablet by mouth at bedtime.    . DULoxetine (CYMBALTA) 30 MG capsule TAKE 1 CAPSULE (30 MG TOTAL) BY MOUTH DAILY. 30 capsule 5  . emollient (BIAFINE) cream Apply topically 2 (two) times  daily.    . gabapentin (NEURONTIN) 300 MG capsule TAKE ONE CAPSULE IN THE DAYTIME AND 2 CAPSULES AT BEDTIME 270 capsule 0  . levothyroxine (SYNTHROID) 75 MCG tablet TAKE 1 TABLET BY MOUTH DAILY BEFORE BREAKFAST. (Patient taking differently: Take 75 mcg by mouth daily before breakfast. ) 90 tablet 3  . pantoprazole (PROTONIX) 40 MG tablet Take 1 tablet (40 mg total) by mouth 2 (two) times  daily. 60 tablet 1  . polyethylene glycol (MIRALAX / GLYCOLAX) packet Take 17 g by mouth daily.     . potassium chloride SA (K-DUR,KLOR-CON) 20 MEQ tablet Take 1 tablet (20 mEq total) by mouth daily. 30 tablet 0  . sucralfate (CARAFATE) 1 g tablet Take 1 tablet (1 g total) by mouth 4 (four) times daily -  with meals and at bedtime. 120 tablet 1  . temazepam (RESTORIL) 15 MG capsule Take 1 capsule (15 mg total) by mouth at bedtime as needed for sleep. 10 capsule 0  . ticagrelor (BRILINTA) 90 MG TABS tablet Take 1 tablet (90 mg total) by mouth 2 (two) times daily. 60 tablet 6  . traMADol (ULTRAM) 50 MG tablet Take 1 tablet (50 mg total) by mouth every 6 (six) hours as needed for moderate pain. 60 tablet 0   No current facility-administered medications for this visit.     SURGICAL HISTORY:  Past Surgical History:  Procedure Laterality Date  . BACK SURGERY    . BASAL CELL CARCINOMA EXCISION     "arms; forehead"  . BREAST BIOPSY Left    benign  . BUNIONECTOMY  2003   bilateral  . IR GENERIC HISTORICAL  06/05/2016   IR ANGIO VERTEBRAL SEL VERTEBRAL BILAT MOD SED 06/05/2016 Consuella Lose, MD [facility]  . IR GENERIC HISTORICAL  06/05/2016   IR ANGIO INTRA EXTRACRAN SEL INTERNAL CAROTID BILAT MOD SED 06/05/2016 Consuella Lose, MD [facility]  . IR GENERIC HISTORICAL  07/03/2016   IR ANGIOGRAM FOLLOW UP STUDY 07/03/2016 Consuella Lose, MD [facility]  . IR GENERIC HISTORICAL  07/03/2016   IR TRANSCATH/EMBOLIZ 07/03/2016 Consuella Lose, MD [facility]  . LUMBAR LAMINECTOMY/DECOMPRESSION MICRODISCECTOMY Left 03/17/2013   Procedure: LUMBAR LAMINECTOMY/DECOMPRESSION IF RECESS STENOSIS FORAMINOTOMIE OF L4 ROOT AND L5,MICRODISECTOMY  L4-5 ON LEFT;  Surgeon: Tobi Bastos, MD;  Location: [facility];  Service: Orthopedics;  Laterality: Left;  . RADIOLOGY WITH ANESTHESIA N/A 07/03/2016   Procedure: arteriogram, pipeline embolization of aneurysm;  Surgeon: Consuella Lose, MD;   Location: [facility];  Service: Radiology;  Laterality: N/A;  . TONSILLECTOMY  1953  . TUBAL LIGATION      REVIEW OF SYSTEMS:  Constitutional: positive for fatigue Eyes: negative Ears, nose, mouth, throat, and face: negative Respiratory: negative Cardiovascular: negative Gastrointestinal: negative Genitourinary:negative Integument/breast: negative Hematologic/lymphatic: negative Musculoskeletal:positive for back pain Neurological: negative Behavioral/Psych: negative Endocrine: negative Allergic/Immunologic: negative   PHYSICAL EXAMINATION: General appearance: alert, cooperative, fatigued and no distress Head: Normocephalic, without obvious abnormality, atraumatic Neck: no adenopathy, no JVD, supple, symmetrical, trachea midline and thyroid not enlarged, symmetric, no tenderness/mass/nodules Lymph nodes: Cervical, supraclavicular, and axillary nodes normal. Resp: clear to auscultation bilaterally Back: symmetric, no curvature. ROM normal. No CVA tenderness. Cardio: regular rate and rhythm, S1, S2 normal, no murmur, click, rub or gallop GI: soft, non-tender; bowel sounds normal; no masses,  no organomegaly Extremities: extremities normal, atraumatic, no cyanosis or edema Neurologic: Alert and oriented X 3, normal strength and tone. Normal symmetric reflexes. Normal coordination and gait  ECOG PERFORMANCE STATUS: 1 - Symptomatic  but completely ambulatory  Blood pressure 118/70, pulse (!) 102, temperature 97.7 F (36.5 C), temperature source Oral, resp. rate 17, height 5' 2"  (1.575 m), weight 97 lb 6.4 oz (44.2 kg), SpO2 99 %.  LABORATORY DATA: Lab Results  Component Value Date   WBC 10.7 (H) 09/15/2016   HGB 9.0 (L) 09/15/2016   HCT 27.0 (L) 09/15/2016   MCV 91.5 09/15/2016   PLT 459 (H) 09/15/2016      Chemistry      Component Value Date/Time   NA 135 (L) 09/15/2016 1046   K 3.5 09/15/2016 1046   CL 104 08/25/2016 1001   CO2 20 (L) 09/15/2016 1046   BUN 13.5 09/15/2016  1046   CREATININE 0.9 09/15/2016 1046      Component Value Date/Time   CALCIUM 9.1 09/15/2016 1046   ALKPHOS 93 09/15/2016 1046   AST <7 09/15/2016 1046   ALT <3 09/15/2016 1046   BILITOT <0.22 09/15/2016 1046       RADIOGRAPHIC STUDIES: Dg Chest 1 View  Result Date: 08/21/2016 CLINICAL DATA:  Post biopsy, possible pneumothorax EXAM: CHEST 1 VIEW COMPARISON:  Twelve/ 27/17 FINDINGS: Again noted right lower lobe mass. Mild right basilar atelectasis. No evidence of pneumothorax. Left lung is clear. IMPRESSION: Persistent mass in right lower lobe. Mild right basilar atelectasis. No evidence of pneumothorax. Electronically Signed   By: Lahoma Crocker M.D.   On: 08/21/2016 13:56   Ct Chest W Contrast  Result Date: 08/19/2016 CLINICAL DATA:  Right hilar mass on chest x-ray, nausea, shortness of breath EXAM: CT CHEST WITH CONTRAST TECHNIQUE: Multidetector CT imaging of the chest was performed during intravenous contrast administration. CONTRAST:  12m ISOVUE-300 IOPAMIDOL (ISOVUE-300) INJECTION 61% COMPARISON:  Chest x-ray of 08/19/2016 FINDINGS: Cardiovascular: Coronary artery calcifications are noted diffusely, primarily involving the left anterior descending coronary artery. The heart is within normal limits in size. No pericardial effusion is seen. The mid ascending thoracic aorta measures 34 mm in diameter. The pulmonary arteries opacify with no central abnormality noted. Mediastinum/Nodes: The mass within the superior segment right lower lobe extending in the posterior inferior right upper lobe does appear to extend into the mediastinum with loss of adjacent fat planes, suggesting direct mediastinal invasion. No definite adenopathy is seen although subcarinal nodes may well be present. Lungs/Pleura: The right perihilar mass described on chest x-ray represents a large solid mass involving the superior segment of the right lower lobe in apparently invading into the posterior inferior aspect of the  right upper lobe. In its largest axial plane this mass measures 6.9 x 7.2 cm with a height of approximately 6.6 cm. There may also be direct invasion of the local paravertebral soft tissues. This lesion is consistent with primary lung carcinoma. There is an 8 mm poorly defined opacity within the right upper lobe posteriorly worrisome for metastatic lesion. Some volume loss is present posteriorly in the right lower lobe. No lesion is seen throughout the left lung. Upper Abdomen: The portion of the upper abdomen that is visualized is unremarkable. Musculoskeletal: There is blastic involvement of T7 vertebral body and to a lesser degree T6 with some involvement of posterior elements consistent with bone metastases. IMPRESSION: 1. Solid mass within the superior segment of the right lower lobe apparently extending into the posterior inferior right upper lobe as well as the mediastinum and adjacent paravertebral soft tissues consistent with primary lung carcinoma of 6.9 x 7.2 x 6.6 cm. 2. 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 3. Coronary artery calcifications. Electronically Signed   By: Ivar Drape M.D.   On: 08/19/2016 13:45   Mr Jodene Nam Neck W Wo Contrast  Result Date: 08/19/2016 CLINICAL DATA:  Follow-up acute LEFT MCA territory infarct. 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 EXAM: MRA NECK WITHOUT AND WITH CONTRAST MRA HEAD WITHOUT CONTRAST MRI HEAD WITH CONTRAST TECHNIQUE: Multiplanar and multiecho pulse sequences of the neck were obtained without and with intravenous contrast. Angiographic images of the neck were obtained using MRA technique without and with intravenous contrast.; Angiographic images of the Circle of Willis were obtained using MRA technique without intravenous contrast. Multiplanar multi P pulse sequences of the head obtained with contrast. CONTRAST:  92m MULTIHANCE GADOBENATE DIMEGLUMINE  529 MG/ML IV SOLN COMPARISON:  CT angiogram of the head May 13, 2016 and MRI head August 19, 2016 at 1646 hours FINDINGS: MRI HEAD FINDINGS No abnormal parenchymal enhancement with particular attention LEFT inferior frontal lobe at site of susceptibility artifact. No abnormal extra-axial enhancement. MRA NECK FINDINGS ANTERIOR CIRCULATION: 2 vessel arch. The common carotid arteries are widely patent bilaterally. The carotid bifurcation is patent bilaterally and there is no hemodynamically significant carotid stenosis by NASCET criteria. Tortuous RIGHT internal carotid artery. No evidence for atherosclerosis or flow limiting stenosis of the cervical internal carotid arteries. POSTERIOR CIRCULATION: Bilateral vertebral arteries are patent to the vertebrobasilar junction. Tortuous vessels. No evidence for atherosclerosis or flow limiting stenosis. Source images and MIP image were reviewed. MRA HEAD FINDINGS ANTERIOR CIRCULATION: Normal flow related enhancement of the included cervical, petrous, RIGHT cavernous and RIGHT supraclinoid internal carotid arteries. Loss of signal LEFT cavernous and supraclinoid internal carotid artery at level of pipeline stent. 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 Patent anterior communicating artery. Normal flow related enhancement of the anterior and middle cerebral arteries, including distal segments. POSTERIOR CIRCULATION: Codominant vertebral artery's. Basilar artery is patent, with normal flow related enhancement of the main branch vessels. Fenestrated proximal basilar artery. Bilateral posterior communicating artery present. Normal flow related enhancement of the posterior cerebral arteries. No large vessel occlusion, high-grade stenosis, abnormal luminal irregularity, aneurysm. IMPRESSION: MRI HEAD: No abnormal enhancement. MRA HEAD: LEFT internal carotid artery pipeline stent, patency cannot accurately be  determined by MR. No emergent large vessel occlusion. 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 MRA NECK:  No hemodynamically significant stenosis. Electronically Signed   By: CElon AlasM.D.   On: 08/19/2016 21:50   Mr Brain Wo Contrast  Result Date: 08/19/2016 CLINICAL DATA:  Right hilar mass.  Right upper extremity weakness. EXAM: MRI HEAD WITHOUT CONTRAST TECHNIQUE: Multiplanar, multiecho pulse sequences of the brain and surrounding structures were obtained without intravenous contrast. COMPARISON:  CTA head neck 04/2016.  Brain MRI 05/04/2014 FINDINGS: Brain: There is a cluster of acute infarcts in the cortical and subcortical left frontal parietal junction along the central sulcus. Punctate cortical infarct in the superior left temporal gyrus and in the left occipital parietal region. There are interval but chronic infarcts also seen in the anterior left frontal lobe, which likely accounts for nonacute blood products in the inferior left frontal lobe. Patient will need postcontrast imaging to evaluate the focus of hemorrhage and white matter changes in this patient with right hilar mass. Microvascular ischemic change in the bilateral cerebral white matter. Rounded pontine T2 hyperintensity on the right, with hemosiderin staining. The this could reflect a capillary  telangiectasia or remote infarct. No evidence of subarachnoid hemorrhage. Vascular: Normal flow voids.  Known intracranial aneurysms. Skull and upper cervical spine: Negative Sinuses/Orbits: Negative IMPRESSION: 1. Cluster of small acute infarcts around the left central sulcus. There also small acute and remote infarcts in the left MCA border zones. Recommend angiographic follow-up in this patient with ipsilateral ICA to MCA stent. 2. Focus of nonacute blood products in the inferior left frontal cortex is likely post ischemic given #1. When appropriate, recommend postcontrast evaluation in this patient with  right hilar mass. Electronically Signed   By: Monte Fantasia M.D.   On: 08/19/2016 17:17   Mr Brain W Contrast  Result Date: 08/19/2016 CLINICAL DATA:  Follow-up acute LEFT MCA territory infarct. 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 EXAM: MRA NECK WITHOUT AND WITH CONTRAST MRA HEAD WITHOUT CONTRAST MRI HEAD WITH CONTRAST TECHNIQUE: Multiplanar and multiecho pulse sequences of the neck were obtained without and with intravenous contrast. Angiographic images of the neck were obtained using MRA technique without and with intravenous contrast.; Angiographic images of the Circle of Willis were obtained using MRA technique without intravenous contrast. Multiplanar multi P pulse sequences of the head obtained with contrast. CONTRAST:  36m MULTIHANCE GADOBENATE DIMEGLUMINE 529 MG/ML IV SOLN COMPARISON:  CT angiogram of the head May 13, 2016 and MRI head August 19, 2016 at 1646 hours FINDINGS: MRI HEAD FINDINGS No abnormal parenchymal enhancement with particular attention LEFT inferior frontal lobe at site of susceptibility artifact. No abnormal extra-axial enhancement. MRA NECK FINDINGS ANTERIOR CIRCULATION: 2 vessel arch. The common carotid arteries are widely patent bilaterally. The carotid bifurcation is patent bilaterally and there is no hemodynamically significant carotid stenosis by NASCET criteria. Tortuous RIGHT internal carotid artery. No evidence for atherosclerosis or flow limiting stenosis of the cervical internal carotid arteries. POSTERIOR CIRCULATION: Bilateral vertebral arteries are patent to the vertebrobasilar junction. Tortuous vessels. No evidence for atherosclerosis or flow limiting stenosis. Source images and MIP image were reviewed. MRA HEAD FINDINGS ANTERIOR CIRCULATION: Normal flow related enhancement of the included cervical, petrous, RIGHT cavernous and RIGHT supraclinoid internal carotid arteries. Loss of signal LEFT cavernous and supraclinoid  internal carotid artery at level of pipeline stent. 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 Patent anterior communicating artery. Normal flow related enhancement of the anterior and middle cerebral arteries, including distal segments. POSTERIOR CIRCULATION: Codominant vertebral artery's. Basilar artery is patent, with normal flow related enhancement of the main branch vessels. Fenestrated proximal basilar artery. Bilateral posterior communicating artery present. Normal flow related enhancement of the posterior cerebral arteries. No large vessel occlusion, high-grade stenosis, abnormal luminal irregularity, aneurysm. IMPRESSION: MRI HEAD: No abnormal enhancement. MRA HEAD: LEFT internal carotid artery pipeline stent, patency cannot accurately be determined by MR. No emergent large vessel occlusion. 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 MRA NECK:  No hemodynamically significant stenosis. Electronically Signed   By: CElon AlasM.D.   On: 08/19/2016 21:50   Mr Thoracic Spine W Wo Contrast  Result Date: 09/04/2016 CLINICAL DATA:  RIGHT lower lobe adenocarcinoma, back pain. EXAM: MRI THORACIC  SPINE WITHOUT AND WITH CONTRAST TECHNIQUE: Multiplanar and multiecho pulse sequences of the thoracic spine were obtained without and with intravenous contrast. CONTRAST:  822mMULTIHANCE GADOBENATE DIMEGLUMINE 529 MG/ML IV SOLN COMPARISON:  CT chest 08/19/2016. FINDINGS: MRI THORACIC SPINE FINDINGS Alignment:  Anatomic Vertebrae: Osseous destruction of the RIGHT T6, and T7 vertebrae. Early superior posterior T8 involvement is  possible. Tumor involves the vertebral body, pedicle, and posterior elements. There is no pathologic compression fracture at this time. 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  Cross-sectional measurements are approximately 6 x 17 mm on image 22 series 11, and epidural tumor extends over a craniocaudal length of 60 mm. No abnormal cord signal. No significant cord compression. Paraspinal and other soft tissues: Large paravertebral mass on the RIGHT described previously, epicenter T6-T7, measures 63 x 67 mm. Disc levels: No disc protrusion is evident. IMPRESSION: Large RIGHT paravertebral mass, biopsy-proven adenocarcinoma of the lung. Osseous invasion of the T6 and T7 vertebral bodies by direct extension. Early T8 involvement is possible. RIGHT-sided epidural tumor maximal at T6-7 displaces the cord RIGHT-to-LEFT without abnormal cord signal or significant cord flattening. These results were called by telephone at the time of interpretation on 09/04/2016 at 2:10 pm to Dr. Isidore Moos, who verbally acknowledged these results. Electronically Signed   By: Staci Righter M.D.   On: 09/04/2016 14:21   Ct Biopsy  Result Date: 08/21/2016 CLINICAL DATA:  Presentation with 7 cm right lower lobe lung mass. EXAM: CT GUIDED CORE BIOPSY OF RIGHT LUNG MASS ANESTHESIA/SEDATION: 1.0 mg IV Versed; 50 mcg IV Fentanyl Total Moderate Sedation Time:  15 minutes. The patient's level of consciousness and physiologic status were continuously monitored during the procedure by Radiology nursing. PROCEDURE: The procedure risks, benefits, and alternatives were explained to the patient. Questions regarding the procedure were encouraged and answered. The patient understands and consents to the procedure. A time-out was performed prior to the procedure. The posterior right chest wall was prepped with chlorhexidine in a sterile fashion, and a sterile drape was applied covering the operative field. A sterile gown and sterile gloves were used for the procedure. Local anesthesia was provided with 1% Lidocaine. CT was performed through the chest in a prone position. Under CT guidance, a 17 gauge needle was advanced into the  posterior right lower lobe at the level of a right lower lobe lung mass. Two separate 18 gauge coaxial core biopsy samples were obtained. Additional imaging was performed after biopsy. COMPLICATIONS: Small right pneumothorax.  SIR level A: No therapy, no consequence. FINDINGS: Large posterior right lower lobe lung mass again noted measuring approximately 7 cm in greatest diameter. Solid tissue was obtained from the mass. Postprocedural imaging shows a small posterior pneumothorax on completion. This will be followed by chest x-ray. The patient was asymptomatic after the procedure with normal oxygen saturation. IMPRESSION: CT-guided core biopsy performed of a right lower lobe lung mass. The procedure was complicated by a small posterior right pneumothorax. This will be followed by chest x-ray. Electronically Signed   By: Aletta Edouard M.D.   On: 08/21/2016 12:02   Dg Abd 2 Views  Result Date: 08/19/2016 CLINICAL DATA:  Small-bowel obstruction EXAM: ABDOMEN - 2 VIEW COMPARISON:  1134 hours on the same day FINDINGS: Scattered nondistended air containing small bowel loops with a few scattered air-fluid levels are again noted suggesting adynamic ileus contrast opacifies the urinary bladder. There is lower lumbar degenerative disc disease from L3 through S1. No free air is identified. Right hilar mass is again seen. IMPRESSION: No significant change in the appearance of the bowel gas pattern possibly representing adynamic ileus given scattered air containing nondistended small bowel loops with scattered air-fluid levels. Right hilar masslike abnormality is again seen. Electronically Signed   By: Shanon Brow  Randel Pigg M.D.   On: 08/19/2016 21:40   Dg Abd Acute W/chest  Result Date: 08/19/2016 CLINICAL DATA:  Abdominal pain intermittently over the last 1 year. EXAM: DG ABDOMEN ACUTE W/ 1V CHEST COMPARISON:  Two-view chest x-ray 03/16/2013. FINDINGS: The heart size is normal. A right perihilar mass is suspected. Aortic  atherosclerosis is present. Emphysematous changes are again noted. No focal airspace disease is present. Fluid levels are present within nondilated loops of large and small bowel. The no obstruction or free air. Progressive scoliosis is present at L3-4 and L4-5 with asymmetric endplate change on the right at L3-4 and on the left at L4-5. IMPRESSION: 1. Right parahilar mass. Recommend CT of the chest with contrast for further evaluation. 2. Emphysema. 3. Fluid levels within nondilated loops of large and small bowel suggesting an adynamic ileus without obstruction. 4. Progressive degenerative change and scoliosis in the lumbar spine. These results were called by telephone at the time of interpretation on 08/19/2016 at 11:59 am to Dr. Tanna Furry , who verbally acknowledged these results. Electronically Signed   By: San Morelle M.D.   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 EXAM: MRA NECK WITHOUT AND WITH CONTRAST MRA HEAD WITHOUT CONTRAST MRI HEAD WITH CONTRAST TECHNIQUE: Multiplanar and multiecho pulse sequences of the neck were obtained without and with intravenous contrast. Angiographic images of the neck were obtained using MRA technique without and with intravenous contrast.; Angiographic images of the Circle of Willis were obtained using MRA technique without intravenous contrast. Multiplanar multi P pulse sequences of the head obtained with contrast. CONTRAST:  47m MULTIHANCE GADOBENATE DIMEGLUMINE 529 MG/ML IV SOLN COMPARISON:  CT angiogram of the head May 13, 2016 and MRI head August 19, 2016 at 1646 hours FINDINGS: MRI HEAD FINDINGS No abnormal parenchymal enhancement with particular attention LEFT inferior frontal lobe at site of susceptibility artifact. No abnormal extra-axial enhancement. MRA NECK FINDINGS ANTERIOR  CIRCULATION: 2 vessel arch. The common carotid arteries are widely patent bilaterally. The carotid bifurcation is patent bilaterally and there is no hemodynamically significant carotid stenosis by NASCET criteria. Tortuous RIGHT internal carotid artery. No evidence for atherosclerosis or flow limiting stenosis of the cervical internal carotid arteries. POSTERIOR CIRCULATION: Bilateral vertebral arteries are patent to the vertebrobasilar junction. Tortuous vessels. No evidence for atherosclerosis or flow limiting stenosis. Source images and MIP image were reviewed. MRA HEAD FINDINGS ANTERIOR CIRCULATION: Normal flow related enhancement of the included cervical, petrous, RIGHT cavernous and RIGHT supraclinoid internal carotid arteries. Loss of signal LEFT cavernous and supraclinoid internal carotid artery at level of pipeline stent. 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 Patent anterior communicating artery. Normal flow related enhancement of the anterior and middle cerebral arteries, including distal segments. POSTERIOR CIRCULATION: Codominant vertebral artery's. Basilar artery is patent, with normal flow related enhancement of the main branch vessels. Fenestrated proximal basilar artery. Bilateral posterior communicating artery present. Normal flow related enhancement of the posterior cerebral arteries. No large vessel occlusion, high-grade stenosis, abnormal luminal irregularity, aneurysm. IMPRESSION: MRI HEAD: No abnormal enhancement. MRA HEAD: LEFT internal carotid artery pipeline stent, patency cannot accurately be determined by MR. No emergent large vessel occlusion. 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 MRA NECK:  No hemodynamically significant stenosis. Electronically Signed   By: CElon AlasM.D.   On:  08/19/2016 21:50   US Abdomen Limited Ruq  Result Date: 08/19/2016 CLINICAL DATA:  71 year old female  with right upper quadrant abdominal pain and nausea since yesterday. Initial encounter. EXAM: US ABDOMEN LIMITED - RIGHT UPPER QUADRANT COMPARISON:  CT Abdomen and Pelvis 07/23/2016 FINDINGS: Gallbladder: No gallstones or wall thickening visualized. No sonographic Murphy sign noted by sonographer. Common bile duct: Diameter: 5 mm, normal Liver: Background liver echogenicity is within normal limits. An apparent small echogenic focus with shadowing in the right liver on image 29 is not correlated on the recent CT Abdomen and Pelvis and is probably an artifact or pseudo-lesion. No discrete liver lesion. Other findings: Negative sonographic appearance of the visible right kidney (image 40). IMPRESSION: Negative right upper quadrant ultrasound. Electronically Signed   By: Genevie Ann M.D.   On: 08/19/2016 10:49    ASSESSMENT AND PLAN:  This is a very pleasant 71 years old white female with metastatic non-small cell lung cancer, adenocarcinoma with negative EGFR, ALK, ROS 1 and BRAF mutations. She has Pedialyte expression of 10%. The patient recently completed a course of palliative radiotherapy to the right lower lobe lung mass with vertebral invasion. Her recent MRI of the thoracic spine showed no clear evidence for cord compression. She is currently on a taper dose of Decadron. I had a lengthy discussion with the patient and her family today about her current disease stage, prognosis and treatment options. I discussed with him the goals of care. I explained to the patient and her family that she has incurable condition and/or the treatment will be off palliative nature. She was given the option of palliative care and hospice referral versus consideration of palliative systemic chemotherapy with carboplatin, Alimta and Avastin every 3 weeks. I discussed with the patient and her family the adverse effect of this treatment including but not limited to alopecia, myelosuppression, nausea and vomiting, peripheral  neuropathy, liver or renal dysfunction, hypertension, proteinuria as well as risk of pulmonary hemorrhage. The family has several questions about her prognosis with and without treatment. I answered them completely to their satisfaction. The patient would like some time to think about her treatment options and she will let us know with her final decision soon. If she decided to proceed with palliative care and hospice, she will get referral from her primary care physician Dr. Regis Bill. For pain management, she will continue on tramadol. The patient was advised to call immediately if she has any concerning symptoms in the interval. The patient voices understanding of current disease status and treatment options and is in agreement with the current care plan.  All questions were answered. The patient knows to call the clinic with any problems, questions or concerns. We can certainly see the patient much sooner if necessary.  I spent 15 minutes counseling the patient face to face. The total time spent in the appointment was 25 minutes.  Disclaimer: This note was dictated with voice recognition software. Similar sounding words can inadvertently be transcribed and may not be corrected upon review.

## 2016-09-17 NOTE — Therapy (Signed)
[facility] [address] [place], [place], [address] Phone: [phone]   Fax:  [phone]  Patient Details  Name: [name] MRN: [id number] Date of Birth: 1945/09/25 Referring Provider:  No ref. provider found  Encounter Date: 09/17/2016     OT Short Term Goals - 08/27/16 1154      OT SHORT TERM GOAL #1   Title Pt wil lbe Min A home program for memory strategies   Time 3   Period Weeks   Status On-going     OT SHORT TERM GOAL #2   Title Pt will be Mod I home program for FM/coordination ex's bilateral UE's    Time 3   Period Weeks   Status On-going     OT SHORT TERM GOAL #3   Title Pt will be Min vc's for fall prevention techniques during ADL's as seen by ability to state 2-3 techniques/strategies.   Time 3   Period Weeks   Status On-going     OT SHORT TERM GOAL #4   Title Pt will be Mod I w/ 2-3 DME, and a/e options to assist with increased ADL independence   Time 3   Period Weeks   Status On-going         OT Long Term Goals - 08/27/16 1154      OT LONG TERM GOAL #1   Title Pt will I'ly state 2-3 memory strategies to assist with safety    Time 6   Period Weeks   Status On-going     OT LONG TERM GOAL #2   Title Pt will be supervision with simulated tub transfers using DME & A/e PRN   Time 6   Period Weeks   Status On-going     OT LONG TERM GOAL #3   Title Pt will be supervision level for simple snack prep in ADL kitchen using necessary DME and maintinaing safety   Time 6   Period Weeks   Status On-going     OT LONG TERM GOAL #4   Title Pt will demonstrate increased strength as seen by RUE gross MMT of 3/5 throughout   Time 6   Period Weeks   Status On-going     OT LONG TERM GOAL #5   Title Pt will demonstrate improved FM/coordination as seen by improved 9 hole peg test score by 25 seconds or more on the right and 10 seconds or more on the left.   Baseline 07/29/16 = R = 112.32 sec,  L = 55.19 sec   Time 6   Period Weeks   Status On-going     OCCUPATIONAL THERAPY DISCHARGE SUMMARY  Visits from Start of Care:   Current functional level related to goals / functional outcomes: Pt did not meet her short term or long term goals, they are ongoing. She and her family are requesting d/c from out-pt OT secondary to newly dx lung cancer. She will be having radiation treatment daily for next 5-6 weeks. Will d/c at this time.   Remaining deficits:   Education / Equipment: HEP, coordination ex's. Putty. Plan: Patient agrees to discharge.  Patient goals were not met. Patient is being discharged due to the patient's request.  ?????Pt is newly diagnosed with lung cancer and will be needing daily radiation treatment for next 5-6 weeks. The patient and family are requesting discharge at this time.      Carlynn Herald, [place], OTR/L 09/17/2016, 8:41 AM  [facility] [address] [place], [place], [address] Phone: [phone]   Fax:  [phone]

## 2016-09-18 ENCOUNTER — Ambulatory Visit
Admission: RE | Admit: 2016-09-18 | Discharge: 2016-09-18 | Disposition: A | Discharge status: Home / Self Care | Payer: Medicare Other | Source: Ambulatory Visit | Attending: Radiation Oncology | Admitting: Radiation Oncology

## 2016-09-18 DIAGNOSIS — L719 Rosacea, unspecified: Secondary | ICD-10-CM | POA: Diagnosis not present

## 2016-09-18 DIAGNOSIS — E039 Hypothyroidism, unspecified: Secondary | ICD-10-CM | POA: Diagnosis not present

## 2016-09-18 DIAGNOSIS — Z7982 Long term (current) use of aspirin: Secondary | ICD-10-CM | POA: Diagnosis not present

## 2016-09-18 DIAGNOSIS — K219 Gastro-esophageal reflux disease without esophagitis: Secondary | ICD-10-CM | POA: Diagnosis not present

## 2016-09-18 DIAGNOSIS — I671 Cerebral aneurysm, nonruptured: Secondary | ICD-10-CM | POA: Diagnosis not present

## 2016-09-18 DIAGNOSIS — I2510 Atherosclerotic heart disease of native coronary artery without angina pectoris: Secondary | ICD-10-CM | POA: Diagnosis not present

## 2016-09-18 DIAGNOSIS — Z85828 Personal history of other malignant neoplasm of skin: Secondary | ICD-10-CM | POA: Diagnosis not present

## 2016-09-18 DIAGNOSIS — M419 Scoliosis, unspecified: Secondary | ICD-10-CM | POA: Diagnosis not present

## 2016-09-18 DIAGNOSIS — Z9889 Other specified postprocedural states: Secondary | ICD-10-CM | POA: Diagnosis not present

## 2016-09-18 DIAGNOSIS — J439 Emphysema, unspecified: Secondary | ICD-10-CM | POA: Diagnosis not present

## 2016-09-18 DIAGNOSIS — Z8673 Personal history of transient ischemic attack (TIA), and cerebral infarction without residual deficits: Secondary | ICD-10-CM | POA: Diagnosis not present

## 2016-09-18 DIAGNOSIS — Z510 Encounter for antineoplastic radiation therapy: Secondary | ICD-10-CM | POA: Diagnosis not present

## 2016-09-18 DIAGNOSIS — C3431 Malignant neoplasm of lower lobe, right bronchus or lung: Secondary | ICD-10-CM | POA: Diagnosis not present

## 2016-09-18 DIAGNOSIS — R531 Weakness: Secondary | ICD-10-CM | POA: Diagnosis not present

## 2016-09-18 DIAGNOSIS — Z79899 Other long term (current) drug therapy: Secondary | ICD-10-CM | POA: Diagnosis not present

## 2016-09-18 DIAGNOSIS — Z888 Allergy status to other drugs, medicaments and biological substances status: Secondary | ICD-10-CM | POA: Diagnosis not present

## 2016-09-19 ENCOUNTER — Encounter: Payer: Self-pay | Admitting: Internal Medicine

## 2016-09-19 DIAGNOSIS — Z5111 Encounter for antineoplastic chemotherapy: Secondary | ICD-10-CM

## 2016-09-19 HISTORY — DX: Encounter for antineoplastic chemotherapy: Z51.11

## 2016-09-21 ENCOUNTER — Other Ambulatory Visit: Payer: Self-pay | Admitting: Radiation Oncology

## 2016-09-21 ENCOUNTER — Ambulatory Visit: Payer: Medicare Other | Admitting: Physical Therapy

## 2016-09-21 ENCOUNTER — Ambulatory Visit
Admission: RE | Admit: 2016-09-21 | Discharge: 2016-09-21 | Disposition: A | Discharge status: Home / Self Care | Payer: Medicare Other | Source: Ambulatory Visit | Attending: Radiation Oncology | Admitting: Radiation Oncology

## 2016-09-21 ENCOUNTER — Telehealth: Payer: Self-pay | Admitting: Medical Oncology

## 2016-09-21 ENCOUNTER — Telehealth: Payer: Self-pay | Admitting: Oncology

## 2016-09-21 ENCOUNTER — Encounter: Payer: Medicare Other | Admitting: *Deleted

## 2016-09-21 ENCOUNTER — Ambulatory Visit: Payer: Medicare Other

## 2016-09-21 ENCOUNTER — Other Ambulatory Visit: Payer: Self-pay | Admitting: Oncology

## 2016-09-21 DIAGNOSIS — Z510 Encounter for antineoplastic radiation therapy: Secondary | ICD-10-CM | POA: Diagnosis not present

## 2016-09-21 DIAGNOSIS — Z9889 Other specified postprocedural states: Secondary | ICD-10-CM | POA: Diagnosis not present

## 2016-09-21 DIAGNOSIS — K219 Gastro-esophageal reflux disease without esophagitis: Secondary | ICD-10-CM | POA: Diagnosis not present

## 2016-09-21 DIAGNOSIS — M419 Scoliosis, unspecified: Secondary | ICD-10-CM | POA: Diagnosis not present

## 2016-09-21 DIAGNOSIS — I2510 Atherosclerotic heart disease of native coronary artery without angina pectoris: Secondary | ICD-10-CM | POA: Diagnosis not present

## 2016-09-21 DIAGNOSIS — R531 Weakness: Secondary | ICD-10-CM | POA: Diagnosis not present

## 2016-09-21 DIAGNOSIS — E039 Hypothyroidism, unspecified: Secondary | ICD-10-CM | POA: Diagnosis not present

## 2016-09-21 DIAGNOSIS — Z8673 Personal history of transient ischemic attack (TIA), and cerebral infarction without residual deficits: Secondary | ICD-10-CM | POA: Diagnosis not present

## 2016-09-21 DIAGNOSIS — C3431 Malignant neoplasm of lower lobe, right bronchus or lung: Secondary | ICD-10-CM

## 2016-09-21 DIAGNOSIS — Z7982 Long term (current) use of aspirin: Secondary | ICD-10-CM | POA: Diagnosis not present

## 2016-09-21 DIAGNOSIS — Z85828 Personal history of other malignant neoplasm of skin: Secondary | ICD-10-CM | POA: Diagnosis not present

## 2016-09-21 DIAGNOSIS — L719 Rosacea, unspecified: Secondary | ICD-10-CM | POA: Diagnosis not present

## 2016-09-21 DIAGNOSIS — I671 Cerebral aneurysm, nonruptured: Secondary | ICD-10-CM | POA: Diagnosis not present

## 2016-09-21 DIAGNOSIS — Z79899 Other long term (current) drug therapy: Secondary | ICD-10-CM | POA: Diagnosis not present

## 2016-09-21 DIAGNOSIS — J439 Emphysema, unspecified: Secondary | ICD-10-CM | POA: Diagnosis not present

## 2016-09-21 DIAGNOSIS — Z888 Allergy status to other drugs, medicaments and biological substances status: Secondary | ICD-10-CM | POA: Diagnosis not present

## 2016-09-21 NOTE — Telephone Encounter (Signed)
Pt told [name], RN in xrt that  she does not want anymore chemo . She expressed getting referral back to PCP for hospice. I called hospice and told them to call pts PCP for referral-Dr Panosh.

## 2016-09-21 NOTE — Progress Notes (Addendum)
[name] has completed  Treatment.  She reports having more pain in the mornings in her right side.  She is taking tramadol 1 every 6 hours and 2 tablets at bedtime.   She would like to stop treatment today.  She also has decided not to have chemotherapy.  She reports she is more short of breath.  She denies having any skin irritation.  She is taking decadron once a day.  She is feeling fatigued.  BP 107/70   Pulse (!) 101   Temp 98.2 F (36.8 C) (Oral)   Ht '5\' 2"'$  (1.575 m)   Wt 97 lb 12.8 oz (44.4 kg)   SpO2 99%   BMI 17.89 kg/m    Wt Readings from Last 3 Encounters:  09/21/16 97 lb 12.8 oz (44.4 kg)  09/17/16 97 lb 6.4 oz (44.2 kg)  09/15/16 95 lb 9.6 oz (43.4 kg)

## 2016-09-21 NOTE — Progress Notes (Signed)
  Radiation Oncology         [phone] ________________________________  Name: [name] MRN: [id number]  Date: 09/21/2016  DOB: Jan 09, 1946   Weekly Radiation Therapy Management    ICD-9-CM ICD-10-CM   1. Primary cancer of right lower lobe of lung (HCC) 162.5 C34.31      Current Dose: 30 Gy     Planned Dose:  35 Gy  Narrative . . . . . . . . The patient presents for routine under treatment assessment.    [name] has completed 12 fractions to her right lung. She reports increased pain in the morning to her right side. She is taking 1 tablet of tramadol ever 6 hours and 2 tablets at bedtime.She would like to stop treatment today. She has also declined chemotherapy. She reports increased shortness of breath. She denies skin irritation. She reports taking Decadron once a day. She reports fatigue.                                  Set-up films were reviewed.                                 The chart was checked. Physical Findings. . [name] with BMI 09/21/2016  Height '5\' 2"'$   Weight 97 lbs 13 oz  BMI 19.5  Systolic 093  Diastolic 70  Pulse 267  Respirations    Lungs are clear to auscultation bilaterally. Heart has regular rate and rhythm. Oropharynx clear. Impression . . . . . . . The patient has tolerated radiation. Plan . . . . . . . . . . . . Follow up in radiation oncology prn. Patient has decided to proceed with hospice and palliative care at this time. ________________________________   Blair Promise, PhD, MD  This document serves as a record of services personally performed by Gery Pray, MD. It was created on his behalf by Bethann Humble, a trained medical scribe. The creation of this record is based on the scribe's personal observations and the provider's statements to them. This document has been checked and approved by the attending provider.

## 2016-09-21 NOTE — Telephone Encounter (Signed)
Left a message for Dr. Worthy Flank nurse advising her that Charletha has decided not to have chemotherapy.

## 2016-09-21 NOTE — Telephone Encounter (Signed)
Called in refill for restoril to [facility] per Dr. Sondra Come.

## 2016-09-21 NOTE — Progress Notes (Addendum)
[name] has completed 12 treatments to her right lung. She reports having more pain in the mornings in her right side.  She is taking tramadol 1 every 6 hours and 2 tablets at bedtime.   She would like to stop treatment today.  She also has decided not to have chemotherapy.  She reports she is more short of breath.  She denies having any skin irritation.  She is taking decadron once a day.  She is feeling fatigued.  BP 107/70   Pulse (!) 101   Temp 98.2 F (36.8 C) (Oral)   Ht '5\' 2"'$  (1.575 m)   Wt 97 lb 12.8 oz (44.4 kg)   SpO2 99%   BMI 17.89 kg/m     Wt Readings from Last 3 Encounters:  09/21/16 97 lb 12.8 oz (44.4 kg)  09/17/16 97 lb 6.4 oz (44.2 kg)  09/15/16 95 lb 9.6 oz (43.4 kg)

## 2016-09-21 NOTE — Addendum Note (Signed)
Encounter addended by: Jacqulyn Liner, RN on: 09/21/2016  9:18 AM<BR>    Actions taken: Charge Capture section accepted

## 2016-09-22 ENCOUNTER — Ambulatory Visit: Payer: Medicare Other

## 2016-09-22 ENCOUNTER — Ambulatory Visit: Payer: Medicare Other | Admitting: Radiation Oncology

## 2016-09-22 ENCOUNTER — Ambulatory Visit: Payer: Medicare Other | Admitting: Physical Therapy

## 2016-09-22 ENCOUNTER — Encounter: Payer: Self-pay | Admitting: Internal Medicine

## 2016-09-22 ENCOUNTER — Encounter: Payer: Medicare Other | Admitting: *Deleted

## 2016-09-22 NOTE — Telephone Encounter (Signed)
Called the patient left a message requesting a call back at the clinic.

## 2016-09-22 NOTE — Telephone Encounter (Signed)
Please contact patient  And confirm that  She is requesting a hospice referral.   If we are to do referral I would like to either  talk with patient    OR  have her  (Or husband)  Send me a direct message about  Wishes and desires   ,and then proceed as she wishes .   Hospice referral can be done by oncology or PCP . ( see  Dr Lew Dawes last note  Last week )

## 2016-09-23 ENCOUNTER — Ambulatory Visit: Payer: Medicare Other

## 2016-09-23 ENCOUNTER — Ambulatory Visit: Payer: Medicare Other | Admitting: Physical Therapy

## 2016-09-23 ENCOUNTER — Encounter: Payer: Self-pay | Admitting: Radiation Oncology

## 2016-09-23 ENCOUNTER — Encounter: Payer: Medicare Other | Admitting: *Deleted

## 2016-09-23 NOTE — Progress Notes (Signed)
  Radiation Oncology         [phone] ________________________________  Name: [name] MRN: [id number]  Date: 09/23/2016  DOB: July 07, 1946  End of Treatment Note  Diagnosis: Primary cancer of right lower lobe of lung [facility])     Indication for treatment: Palliative       Radiation treatment dates:   09/02/16-09/21/16  Site/dose:  Right lung/ 30 Gy in 12 fractions  Beams/energy:   3D /10X, 15X, 6X  Narrative: The patient tolerated radiation treatment relatively well. During treatment, the patient complained of pain in the morning to her right side. She also noted shortness of breath and fatigue. She requested to stop treatment early (30 Gy out of planned 35 Gy). She also denied chemotherapy.  Plan: Follow up in radiation oncology prn. Patient has decided to proceed with hospice and palliative care at this time.  -----------------------------------  Blair Promise, PhD, MD  This document serves as a record of services personally performed by Gery Pray, MD. It was created on his behalf by Bethann Humble, a trained medical scribe. The creation of this record is based on the scribe's personal observations and the provider's statements to them. This document has been checked and approved by the attending provider.

## 2016-09-23 NOTE — Telephone Encounter (Signed)
See MyChart message.  Per Dr. Regis Bill, ok to order hospice.

## 2016-09-23 NOTE — Telephone Encounter (Signed)
Pleas do referral to  [facility] of [facility] for  Dx metastatic lung cancer .

## 2016-09-24 ENCOUNTER — Encounter: Payer: Medicare Other | Admitting: *Deleted

## 2016-09-24 ENCOUNTER — Ambulatory Visit: Payer: Medicare Other | Admitting: Physical Therapy

## 2016-09-24 ENCOUNTER — Encounter: Payer: Medicare Other | Admitting: Occupational Therapy

## 2016-09-28 ENCOUNTER — Ambulatory Visit: Payer: Medicare Other | Admitting: Neurology

## 2016-09-30 NOTE — Progress Notes (Deleted)
No chief complaint on file.   HPI: [name] 71 y.o. come in for fu  After dx of metastatic nsc lung cancer   And  Notes to decision to  Not do chemo and refer to hospice  ROS: See pertinent positives and negatives per HPI.  Past Medical History:  Diagnosis Date  . Anemia   . Anxiety   . Back pain, thoracic 09/01/2016  . Basal cell carcinoma of skin    "cut and burned off; nothing in years" (08/20/2016)  . Depression   . Emphysema of lung ([facility]) dx'd 08/19/2016  . Encounter for antineoplastic chemotherapy 09/19/2016  . Encounter for smoking cessation counseling 09/01/2016  . GERD (gastroesophageal reflux disease)    WOULD USE OTC MEDICATION IF NEEDED  . Headache    "probably monthly" (08/20/2016)  . Hypothyroidism   . Lung mass    "bx scheduled in am" (08/20/2016)  . 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 Pneumonia ~ 2010  . Rosacea   . Stroke ([facility]) 08/19/2016   "right arm weaker speech issues; right face droops" (08/20/2016)  . Tachycardia    PT STATES HER HEART RATE USUALLY ABOVE 100  . TIA (transient ischemic attack)    "hx several small strokes in the past year" (08/20/2016)    Family History  Problem Relation Age of Onset  . Pancreatic cancer Brother   . Heart disease      Social History   Social History  . Marital status: Married    Spouse name: N/A  . Number of children: N/A  . Years of education: N/A   Social History Main Topics  . Smoking status: Current Every Day Smoker    Packs/day: 0.50    Years: 52.00    Types: Cigarettes  . Smokeless tobacco: Never Used     Comment: 5-6 cigarettes per day currently  . Alcohol use 0.0 oz/week     Comment: 08/20/2016 "maybe once/year"  . Drug use: No  . Sexual activity: Not on file   Other Topics Concern  . Not on file   Social History Narrative   Occupation:  Manicure pedicurist   Works about 30 hours a week to 15 hours    Momence getting a divorce    Married.    No health insurance self employed  Now on medicare    2 dogs     Outpatient Medications Prior to Visit  Medication Sig Dispense Refill  . amitriptyline (ELAVIL) 100 MG tablet TAKE 3 TABLETS BY MOUTH EVERY DAY (Patient taking differently: Take 300 mg by mouth at bedtime) 270 tablet 1  . aspirin (ASPIRIN CHILDRENS) 81 MG chewable tablet Chew 1 tablet (81 mg total) by mouth daily.    Marland Kitchen dexamethasone (DECADRON) 4 MG tablet Take 1 tablet (4 mg total) by mouth 2 (two) times daily with a meal. Continue Protonix.  Stop if this causes significant nausea. 40 tablet 0  . diphenhydramine-acetaminophen (TYLENOL PM) 25-500 MG TABS tablet Take 1 tablet by mouth at bedtime.    . DULoxetine (CYMBALTA) 30 MG capsule TAKE 1 CAPSULE (30 MG TOTAL) BY MOUTH DAILY. 30 capsule 5  . emollient (BIAFINE) cream Apply topically 2 (two) times daily.    Marland Kitchen gabapentin (NEURONTIN) 300 MG capsule TAKE ONE CAPSULE IN THE DAYTIME AND 2 CAPSULES AT BEDTIME 270 capsule 0  . levothyroxine (SYNTHROID) 75 MCG tablet TAKE 1 TABLET BY MOUTH DAILY BEFORE BREAKFAST. (  Patient taking differently: Take 75 mcg by mouth daily before breakfast. ) 90 tablet 3  . pantoprazole (PROTONIX) 40 MG tablet Take 1 tablet (40 mg total) by mouth 2 (two) times daily. 60 tablet 1  . polyethylene glycol (MIRALAX / GLYCOLAX) packet Take 17 g by mouth daily.     . potassium chloride SA (K-DUR,KLOR-CON) 20 MEQ tablet Take 1 tablet (20 mEq total) by mouth daily. 30 tablet 0  . sucralfate (CARAFATE) 1 g tablet Take 1 tablet (1 g total) by mouth 4 (four) times daily -  with meals and at bedtime. 120 tablet 1  . temazepam (RESTORIL) 15 MG capsule Take 1 capsule (15 mg total) by mouth at bedtime as needed for sleep. (Patient taking differently: Take 15 mg by mouth at bedtime as needed for sleep. Called in [facility]ill for [facility] tablets [facility] Dr. Sondra Come on 09/21/16.) 10 capsule 0  . ticagrelor (BRILINTA) 90 MG TABS tablet Take 1 tablet (90 mg total) by mouth 2  (two) times daily. 60 tablet 6  . traMADol (ULTRAM) 50 MG tablet Take 1 tablet (50 mg total) by mouth every 6 (six) hours as needed for moderate pain. 60 tablet 0   No facility-administered medications prior to visit.      EXAM:  There were no vitals taken for this visit.  There is no height or weight on file to calculate BMI.  GENERAL: vitals reviewed and listed above, alert, oriented, appears well hydrated and in no acute distress HEENT: atraumatic, conjunctiva  clear, no obvious abnormalities on inspection of external nose and ears OP : no lesion edema or exudate  NECK: no obvious masses on inspection palpation  LUNGS: clear to auscultation bilaterally, no wheezes, rales or rhonchi, good air movement CV: HRRR, no clubbing cyanosis or  peripheral edema nl cap refill  MS: moves all extremities without noticeable focal  abnormality PSYCH: pleasant and cooperative, no obvious depression or anxiety Lab Results  Component Value Date   WBC 10.7 (H) 09/15/2016   HGB 9.0 (L) 09/15/2016   HCT 27.0 (L) 09/15/2016   PLT 459 (H) 09/15/2016   GLUCOSE 91 09/15/2016   CHOL 139 08/20/2016   TRIG 72 08/20/2016   HDL 51 08/20/2016   LDLDIRECT 132.5 06/23/2010   LDLCALC 74 08/20/2016   ALT <3 09/15/2016   AST <7 09/15/2016   NA 135 (L) 09/15/2016   K 3.5 09/15/2016   CL 104 08/25/2016   CREATININE 0.9 09/15/2016   BUN 13.5 09/15/2016   CO2 20 (L) 09/15/2016   TSH 1.63 02/13/2016   INR 1.06 08/20/2016   HGBA1C 5.4 08/20/2016   BP Readings from Last 3 Encounters:  09/21/16 107/70  09/17/16 118/70  09/15/16 130/81    ASSESSMENT AND PLAN:  Discussed the following assessment and plan:  No diagnosis found. Lung cancer  Anemia  Pain  Hospice care  -Patient advised to return or notify health care team  if  new concerns arise.  There are no Patient Instructions on file for this visit.   Standley Brooking. Lacrecia Delval M.D.

## 2016-10-01 ENCOUNTER — Encounter: Payer: Medicare Other | Admitting: *Deleted

## 2016-10-05 ENCOUNTER — Ambulatory Visit: Payer: Medicare Other | Admitting: Internal Medicine

## 2016-10-06 ENCOUNTER — Encounter: Payer: Medicare Other | Admitting: *Deleted

## 2016-10-12 ENCOUNTER — Encounter: Payer: Self-pay | Admitting: Internal Medicine

## 2016-10-12 ENCOUNTER — Telehealth: Payer: Self-pay | Admitting: Internal Medicine

## 2016-10-22 NOTE — Telephone Encounter (Signed)
Hospice called to advise pt passed last night at 12:45 am at home in her sleep

## 2016-10-22 NOTE — Telephone Encounter (Signed)
Noted  

## 2016-10-22 DEATH — deceased

## 2016-11-03 ENCOUNTER — Encounter: Payer: Medicare Other | Admitting: *Deleted

## 2016-12-04 NOTE — Telephone Encounter (Signed)
error 

## 2016-12-25 NOTE — Therapy (Signed)
[facility] [address] [place], [place], [address] Phone: [phone]   Fax:  [phone]  Patient Details  Name: [name] MRN: [id number] Date of Birth: [birth year] Referring Provider:  No ref. provider found  Encounter Date: 12/25/2016  SPEECH THERAPY DISCHARGE SUMMARY  Visits from Start of Care: 1 (eval)  Current functional level related to goals / functional outcomes: Pt was only seen for eval. Further appointments were not made. The pt has, since that time, unfortunately expired.   Remaining deficits: STG and LTG at time of eval were as follows:  SLP Short Term Goals - 08/27/16 1435              SLP SHORT TERM GOAL #1    Title pt will demo sustained attention in quiet room to complete simple cognitive linguistic tasks for 3 minutes with no more than 3 redirections to task    Time 4    Period Weeks    Status On-going         SLP SHORT TERM GOAL #2    Title pt will tell SLP 3 cognitive linguistic deficits with memory compensations allowed, over two sessions    Time 4    Period Weeks    Status New                       SLP Long Term Goals - 08/27/16 1437              SLP LONG TERM GOAL #1    Title pt will demo selective attention in a min noisy environment for 8 minutes with no more than 5 redirections back to a simple written/auditory task    Time 8  or 16 visits    Period Weeks    Status New         SLP LONG TERM GOAL #2    Title pt will demo ability to functionally sequence a simple, functional cognitive linguistic task     Time 8    Period Weeks    Status New       Education / Equipment: Treatment course  Plan: Patient agrees to discharge.  Patient goals were not met. Patient is being discharged due to not returning since the last visit.  ?????       [facility] ,[place], CCC-SLP  12/25/2016, 8:33 AM  [facility] [address] [place], [place], [address] Phone: [phone]   Fax:  [phone]

## 2017-03-25 IMAGING — MR MR THORACIC SPINE WO/W CM
4 of 8 series · 19 of 48 positions shown · IV contrast (multihance)
Comparison: CT chest 08/19/2016.

CLINICAL DATA: RIGHT lower lobe adenocarcinoma, back pain.

EXAM:
MRI THORACIC  SPINE WITHOUT AND WITH CONTRAST
TECHNIQUE: Multiplanar and multiecho pulse sequences of the thoracic spine were
obtained without and with intravenous contrast.
CONTRAST:  8mL MULTIHANCE GADOBENATE DIMEGLUMINE 529 MG/ML IV SOLN

[Series 5: T1 · sagittal · 3.0mm · 0.62mm/px · 3 of 14 slices shown (1 of 2)]
[im 1/14]
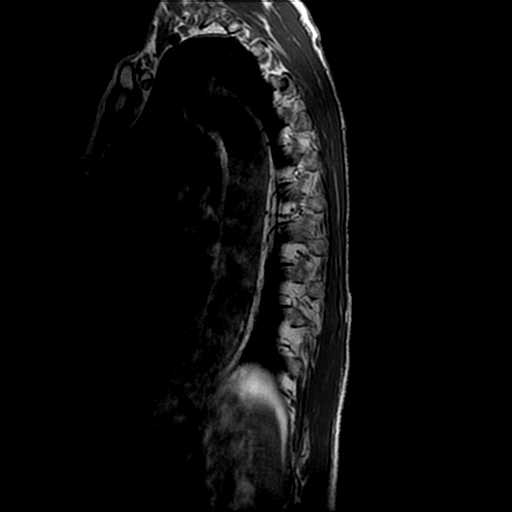
[im 7/14]
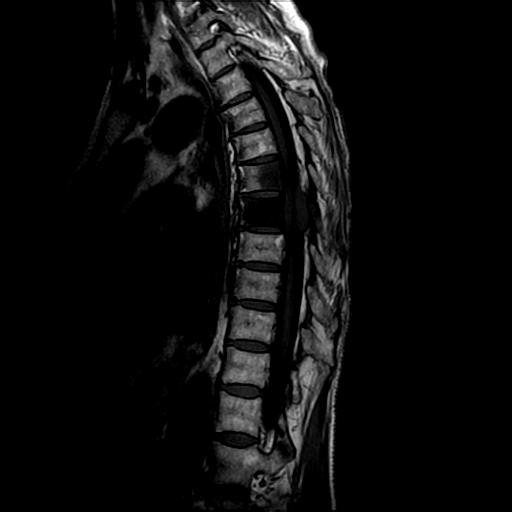
[im 14/14]
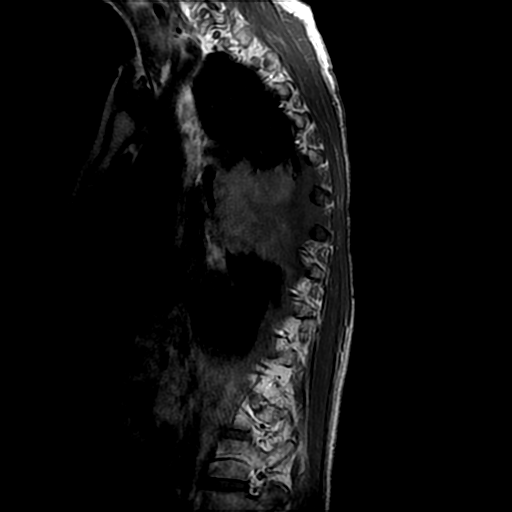

[Series 7: T2 · axial · 4.0mm · 0.39mm/px · z∈[-252,-61]mm · 10 of 44 slices shown]
[im 1/44]
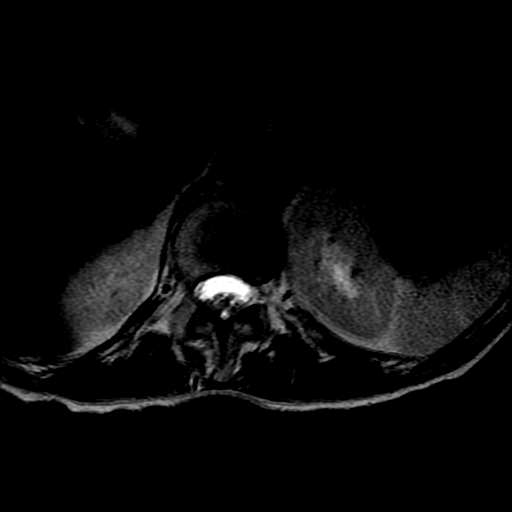
[im 5/44]
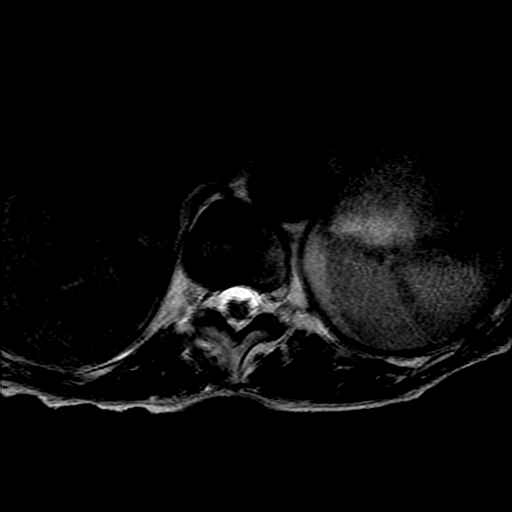
[im 9/44]
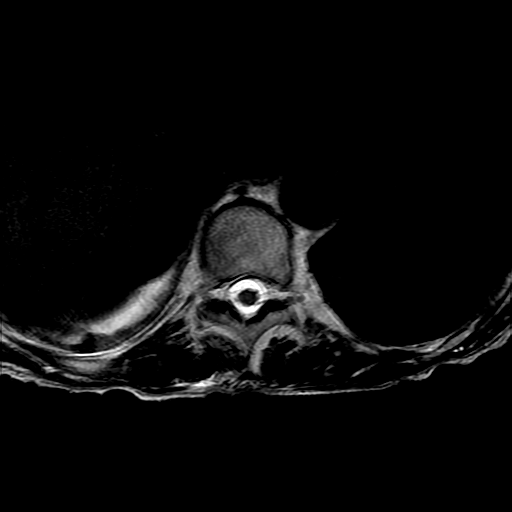
[im 13/44]
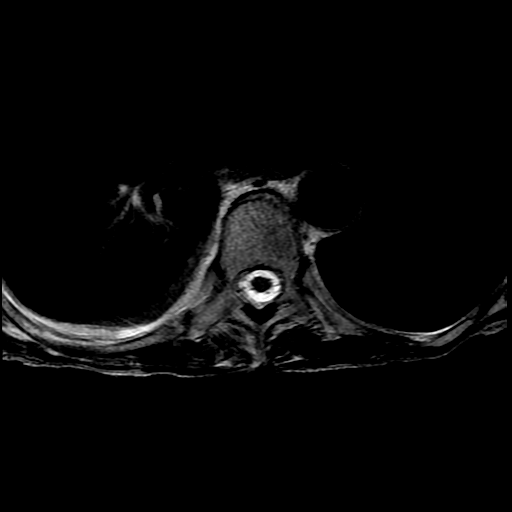
[im 18/44]
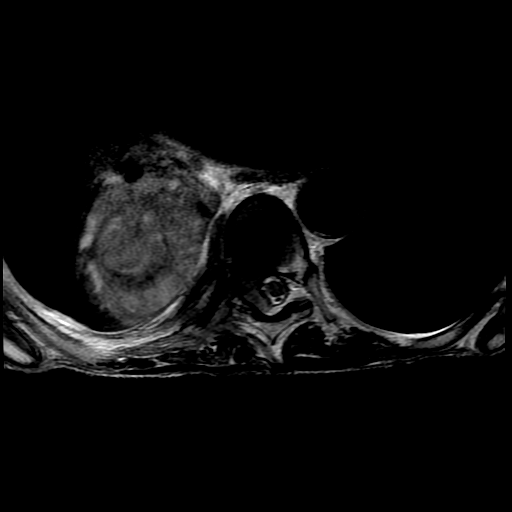
[im 22/44]
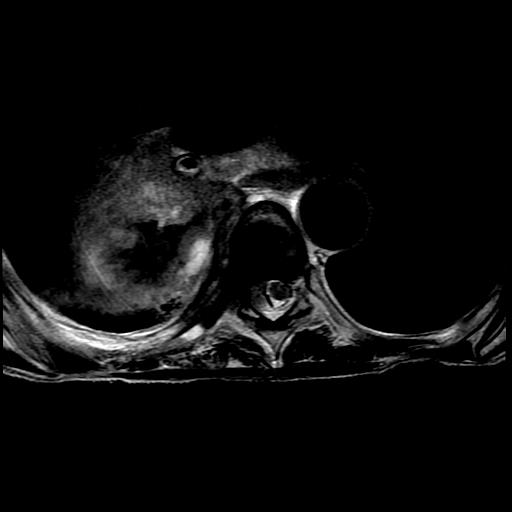
[im 26/44]
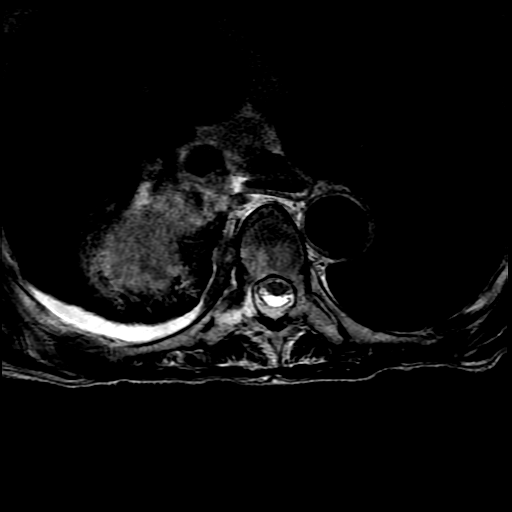
[im 31/44]
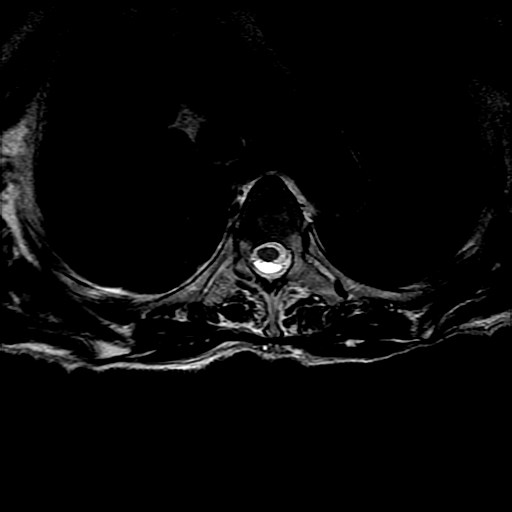
[im 35/44]
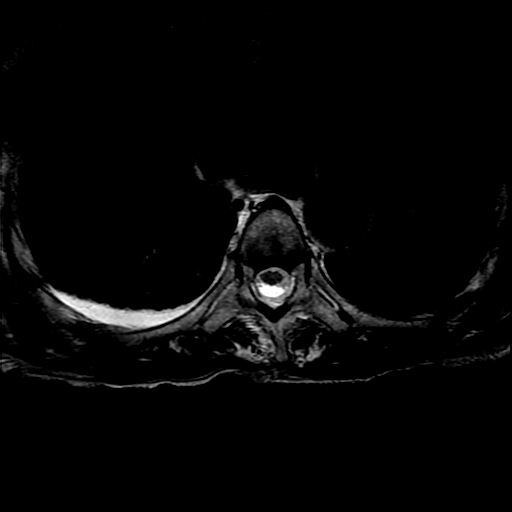
[im 39/44]
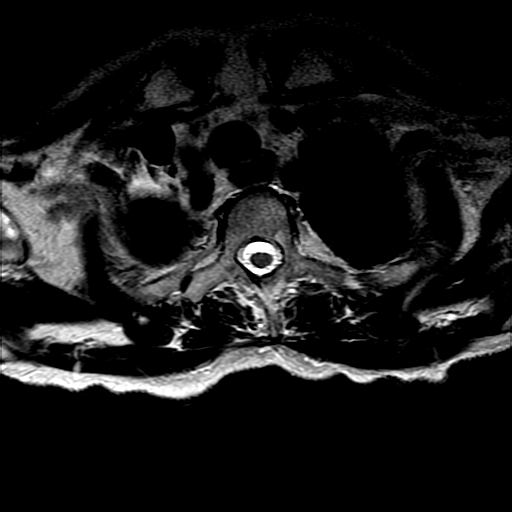

[Series 8: T1 · axial · non-contrast · 4.0mm · 0.39mm/px · z∈[-216,-61]mm · 3 of 44 slices shown (2 of 2)]
[im 5/44]
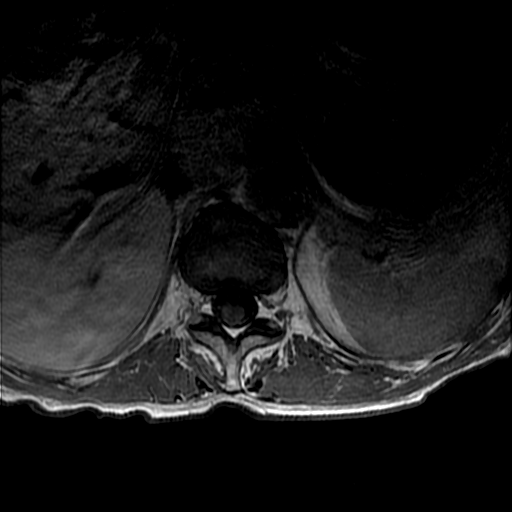
[im 22/44]
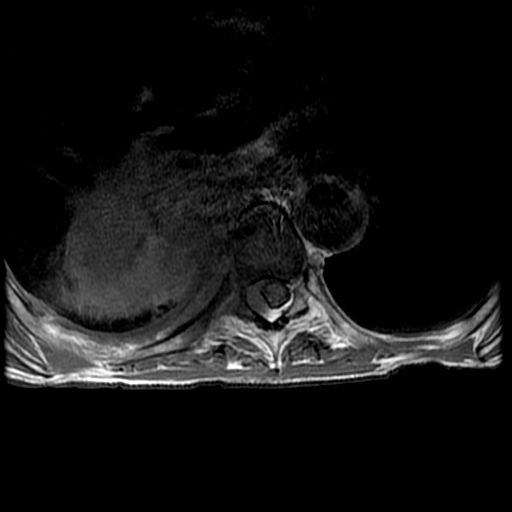
[im 39/44]
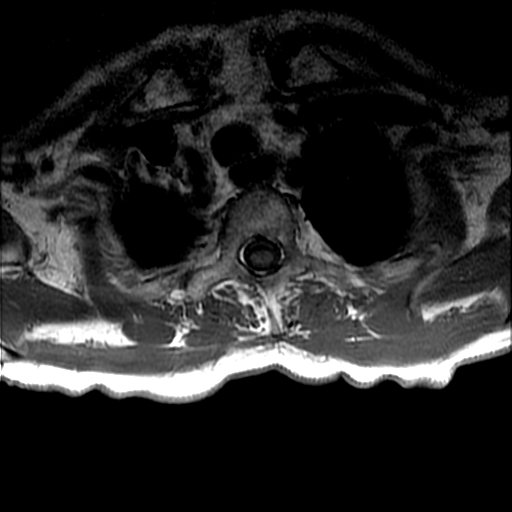

[Series 9: T2 post-contrast · sagittal · 3.0mm · 0.62mm/px · 3 of 14 slices shown]
[im 1/14]
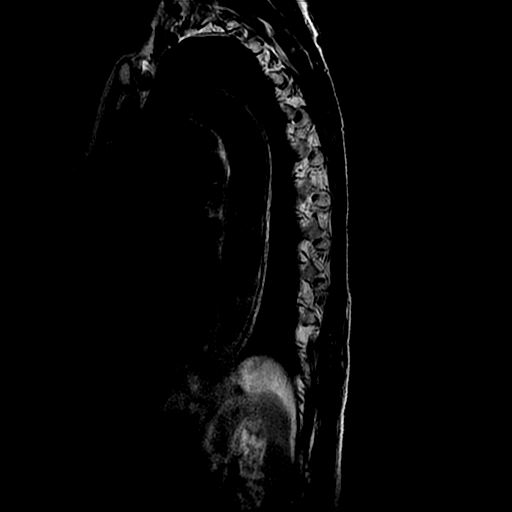
[im 7/14]
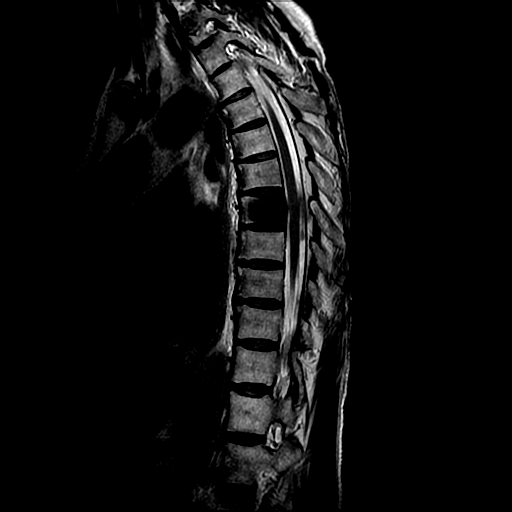
[im 14/14]
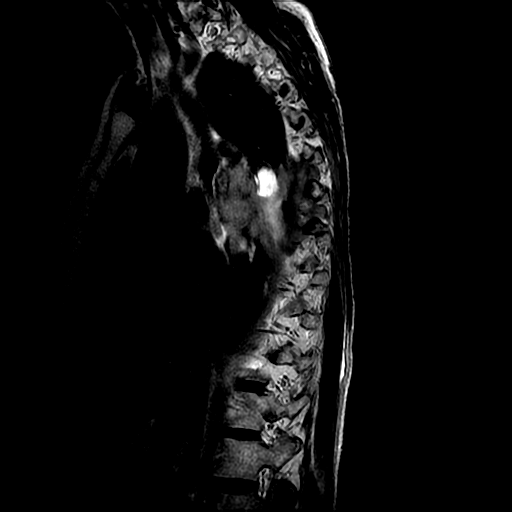

[19 of 48 positions shown; findings below may reference images not displayed]

FINDINGS: MRI THORACIC SPINE FINDINGS

Alignment:  Anatomic

Vertebrae: Osseous destruction of the RIGHT T6, and T7 vertebrae.
Early superior posterior T8 involvement is possible. Tumor involves
the vertebral body, pedicle, and posterior elements. There is no
pathologic compression fracture at this time.

Cord: Moderate RIGHT-to-LEFT cord displacement by epidural tumor,
entering the spinal canal via the T6-7 and T7-8 foramina, dorsally
and ventrally, primarily on the RIGHT. Cross-sectional measurements
are approximately 6 x 17 mm on image 22 series 11, and epidural
tumor extends over a craniocaudal length of 60 mm. No abnormal cord
signal. No significant cord compression.

Paraspinal and other soft tissues: Large paravertebral mass on the
RIGHT described previously, epicenter T6-T7, measures 63 x 67 mm.

Disc levels:

No disc protrusion is evident.
IMPRESSION: Large RIGHT paravertebral mass, biopsy-proven adenocarcinoma of the
lung.

Osseous invasion of the T6 and T7 vertebral bodies by direct
extension. Early T8 involvement is possible. RIGHT-sided epidural
tumor maximal at T6-7 displaces the cord RIGHT-to-LEFT without
abnormal cord signal or significant cord flattening.

These results were called by telephone at the time of interpretation
on 09/04/2016 at [DATE] to Dr. Elinsuperable, who verbally acknowledged
these results.
